# Patient Record
Sex: Female | Born: 1975 | Race: Black or African American | Hispanic: No | Smoking: Former smoker
Health system: Southern US, Community
[De-identification: ages and names within clinical notes are randomized; demographics above are authoritative.]

## PROBLEM LIST (undated history)

## (undated) DIAGNOSIS — R569 Unspecified convulsions: Secondary | ICD-10-CM

## (undated) DIAGNOSIS — Z0282 Encounter for adoption services: Secondary | ICD-10-CM

## (undated) DIAGNOSIS — G40909 Epilepsy, unspecified, not intractable, without status epilepticus: Secondary | ICD-10-CM

## (undated) DIAGNOSIS — H5440 Blindness, one eye, unspecified eye: Secondary | ICD-10-CM

## (undated) DIAGNOSIS — E119 Type 2 diabetes mellitus without complications: Secondary | ICD-10-CM

## (undated) DIAGNOSIS — G43909 Migraine, unspecified, not intractable, without status migrainosus: Secondary | ICD-10-CM

## (undated) DIAGNOSIS — M1990 Unspecified osteoarthritis, unspecified site: Secondary | ICD-10-CM

## (undated) DIAGNOSIS — I10 Essential (primary) hypertension: Secondary | ICD-10-CM

## (undated) DIAGNOSIS — Q796 Ehlers-Danlos syndrome, unspecified: Secondary | ICD-10-CM

## (undated) DIAGNOSIS — J45909 Unspecified asthma, uncomplicated: Secondary | ICD-10-CM

## (undated) DIAGNOSIS — Z789 Other specified health status: Secondary | ICD-10-CM

## (undated) DIAGNOSIS — T8859XA Other complications of anesthesia, initial encounter: Secondary | ICD-10-CM

## (undated) HISTORY — DX: Unspecified convulsions: R56.9

## (undated) HISTORY — DX: Essential (primary) hypertension: I10

## (undated) HISTORY — PX: CERVICAL FUSION: SHX112

## (undated) HISTORY — DX: Type 2 diabetes mellitus without complications: E11.9

## (undated) HISTORY — PX: HIP ARTHROSCOPY: SUR88

## (undated) HISTORY — PX: CARPAL TUNNEL RELEASE: SHX101

## (undated) HISTORY — PX: PARTIAL HYSTERECTOMY: SHX80

---

## 2012-03-15 DIAGNOSIS — E1140 Type 2 diabetes mellitus with diabetic neuropathy, unspecified: Secondary | ICD-10-CM | POA: Insufficient documentation

## 2012-03-15 DIAGNOSIS — G894 Chronic pain syndrome: Secondary | ICD-10-CM | POA: Insufficient documentation

## 2012-03-15 DIAGNOSIS — Z5181 Encounter for therapeutic drug level monitoring: Secondary | ICD-10-CM | POA: Insufficient documentation

## 2012-05-09 DIAGNOSIS — M461 Sacroiliitis, not elsewhere classified: Secondary | ICD-10-CM | POA: Insufficient documentation

## 2012-07-05 DIAGNOSIS — M5137 Other intervertebral disc degeneration, lumbosacral region: Secondary | ICD-10-CM | POA: Insufficient documentation

## 2012-10-27 DIAGNOSIS — R739 Hyperglycemia, unspecified: Secondary | ICD-10-CM | POA: Insufficient documentation

## 2013-07-31 DIAGNOSIS — R270 Ataxia, unspecified: Secondary | ICD-10-CM | POA: Insufficient documentation

## 2013-07-31 DIAGNOSIS — G255 Other chorea: Secondary | ICD-10-CM | POA: Insufficient documentation

## 2013-09-21 DIAGNOSIS — L739 Follicular disorder, unspecified: Secondary | ICD-10-CM | POA: Insufficient documentation

## 2013-10-20 DIAGNOSIS — M5020 Other cervical disc displacement, unspecified cervical region: Secondary | ICD-10-CM | POA: Insufficient documentation

## 2013-10-24 DIAGNOSIS — G43719 Chronic migraine without aura, intractable, without status migrainosus: Secondary | ICD-10-CM | POA: Insufficient documentation

## 2014-02-10 DIAGNOSIS — M542 Cervicalgia: Secondary | ICD-10-CM | POA: Insufficient documentation

## 2014-06-29 DIAGNOSIS — M79641 Pain in right hand: Secondary | ICD-10-CM | POA: Insufficient documentation

## 2014-07-11 DIAGNOSIS — Z794 Long term (current) use of insulin: Secondary | ICD-10-CM | POA: Insufficient documentation

## 2014-07-22 DIAGNOSIS — G5601 Carpal tunnel syndrome, right upper limb: Secondary | ICD-10-CM | POA: Insufficient documentation

## 2014-08-24 DIAGNOSIS — M25561 Pain in right knee: Secondary | ICD-10-CM | POA: Insufficient documentation

## 2014-08-27 DIAGNOSIS — F331 Major depressive disorder, recurrent, moderate: Secondary | ICD-10-CM | POA: Insufficient documentation

## 2014-09-25 ENCOUNTER — Ambulatory Visit: Payer: Self-pay | Admitting: Sports Medicine

## 2014-12-12 DIAGNOSIS — R252 Cramp and spasm: Secondary | ICD-10-CM | POA: Insufficient documentation

## 2015-04-03 ENCOUNTER — Ambulatory Visit (INDEPENDENT_AMBULATORY_CARE_PROVIDER_SITE_OTHER): Payer: Federal, State, Local not specified - PPO | Admitting: Sports Medicine

## 2015-04-03 ENCOUNTER — Encounter: Payer: Self-pay | Admitting: Sports Medicine

## 2015-04-03 VITALS — Ht 62.0 in | Wt 203.0 lb

## 2015-04-03 DIAGNOSIS — Q796 Ehlers-Danlos syndrome: Secondary | ICD-10-CM | POA: Diagnosis not present

## 2015-04-03 DIAGNOSIS — M62831 Muscle spasm of calf: Secondary | ICD-10-CM | POA: Diagnosis not present

## 2015-04-03 DIAGNOSIS — Q7962 Hypermobile Ehlers-Danlos syndrome: Secondary | ICD-10-CM

## 2015-04-03 DIAGNOSIS — G40909 Epilepsy, unspecified, not intractable, without status epilepticus: Secondary | ICD-10-CM

## 2015-04-03 DIAGNOSIS — M25572 Pain in left ankle and joints of left foot: Secondary | ICD-10-CM

## 2015-04-03 DIAGNOSIS — G90523 Complex regional pain syndrome I of lower limb, bilateral: Secondary | ICD-10-CM | POA: Diagnosis not present

## 2015-04-03 DIAGNOSIS — M25571 Pain in right ankle and joints of right foot: Secondary | ICD-10-CM

## 2015-04-03 DIAGNOSIS — E109 Type 1 diabetes mellitus without complications: Secondary | ICD-10-CM

## 2015-04-03 DIAGNOSIS — G43909 Migraine, unspecified, not intractable, without status migrainosus: Secondary | ICD-10-CM

## 2015-04-03 MED ORDER — DOXEPIN HCL 10 MG PO CAPS: 10.0000 mg | ORAL_CAPSULE | Freq: Two times a day (BID) | ORAL | Status: DC

## 2015-04-03 NOTE — Progress Notes (Signed)
  Sara [name] - 39 y.o. female MRN [id number]  Date of birth: October 30, 1975  CC: b/l lower extremity swelling and pain.   SUBJECTIVE:   HPI  - Diagnosed with EDS type III several months ago Dr. Peggye Form at [facility].  Recommended to see [place] by her therapist Silas Flood, who is a patient here).  - Beighton score: 3/9 (5th digit: 0; thumbs: 0, Spine: 0; elbows: 1; knees: 2) - Multiple joint and muscle pain. Came here to get help with mobility and pain.   - Seen by orthopedics at [facility].   - Has Epilepsy and is on several seizure meds.  - Type 1 diabetic with pump...reportedly well controlled.   Neck: had fusion done several years ago and is now is starting to have pain again.  - Neck posture poor  - No longer has cervical collar - She tires at the end of the day and has trouble maintaining good posture  - B/l hip, lower leg, and ankle pain.  - Pain and swelling for years that has slowly worsened - Had right hip arthroscopy in 2012, which didn't help much. Told she may need total hip.  - Sent to pain management since 2012 for hip, SI, back pain. She has had multiple injections in her neck and back with minimal relief.     - Takes oxycodone/hydrocodone   - Takes multiple multiple musclerelaxers - Takes doxepin  - Calf spasms frequently - Swelling in ankles daily.  Painful popping.  - No surgeries to ankles - Usually wears sneakers. - Now she feels very tight in her ankles and pain with even straight line walking.    ROS:     No acute changes in her review of systems other than that listed in her HPI.    HISTORY: Past Medical, Surgical, Social, and Family History Reviewed & Updated per EMR.  Pertinent Historical Findings include: - Type 1 diabetic with pump - Epilepsy - EDS type III - Multiple ortho surgeries in the past including a cervical fusion, hip arthroscopy, carpal tunnel release; all of which do not seem to provide lasting benefit.  - Former  smoker  OBJECTIVE: Ht  (1.575 m)  Wt 203 lb (92.08 kg)  BMI 37.12 kg/m2  Physical Exam  Gen: calm, tearful intermittently thorughout exam. Non-labored breathing  Neck: Negative spurling's Full neck range of motion Grip strength and sensation normal in bilateral hands Strength good C4 to T1 distribution No sensory change to C4 to T1 Reflexes normal  Lower legs: Tight gastroc with minimal pain to deep palpation b/l. No skin changes.   Ankle: b/l 1+ pitting edema, symmetric. No  erythema or other skin changes. Range of motion is limited in all directions.  Strength is 4+/5 in all directions. Moderate pain with resisted movement.  Stable lateral and medial ligaments: although quite painful b/l  Both soft tissue and bony tenderness b/l over med/lat malleoli as well as anteriorly Antalgic gait.   Neurovascularly intact.    MEDICATIONS, LABS & OTHER ORDERS: Previous Medications   No medications on file   Modified Medications   No medications on file   New Prescriptions   No medications on file   Discontinued Medications   No medications on file  No orders of the defined types were placed in this encounter.   ASSESSMENT & PLAN: See problem based charting & AVS for pt instructions.

## 2015-04-03 NOTE — Patient Instructions (Signed)
Neck Pain: - Wear your neck collar several hours a day as needed when your neck is getting tired.  - Work on isometric exercises: Push against your hand forward, backward, and both right and left.   - Try to keep your chin in line with your breastbone when sitting. - Try to keep your shoulders back behind your ears when sitting.   - Do 3 repetitions for20-30 seconds of "T" & "H" stretching at the end of the day.   Ankles: - Provide b/l ankle sleeves. Wear those as much as possible.  - Once or twice a day try to range of motion exercises (one option is to spell the alphabet) in warm water.    - Will increase doxepine to  twice daily.  After 3 weeks you may increase to 3 times a day.   Please make a follow up appointment in 6 weeks.

## 2015-04-04 ENCOUNTER — Encounter: Payer: Self-pay | Admitting: Sports Medicine

## 2015-04-04 DIAGNOSIS — M62831 Muscle spasm of calf: Secondary | ICD-10-CM | POA: Insufficient documentation

## 2015-04-04 DIAGNOSIS — Q7962 Hypermobile Ehlers-Danlos syndrome: Secondary | ICD-10-CM | POA: Insufficient documentation

## 2015-04-04 DIAGNOSIS — M79669 Pain in unspecified lower leg: Secondary | ICD-10-CM | POA: Insufficient documentation

## 2015-04-04 DIAGNOSIS — G40909 Epilepsy, unspecified, not intractable, without status epilepticus: Secondary | ICD-10-CM | POA: Insufficient documentation

## 2015-04-04 DIAGNOSIS — E109 Type 1 diabetes mellitus without complications: Secondary | ICD-10-CM | POA: Insufficient documentation

## 2015-04-04 DIAGNOSIS — G43909 Migraine, unspecified, not intractable, without status migrainosus: Secondary | ICD-10-CM | POA: Insufficient documentation

## 2015-04-04 NOTE — Assessment & Plan Note (Signed)
Patient was recently diagnosed with EDS Type III. She has had a neck fusion as well as several other surgical interventions with minimal long term improvement in pain.  Her biggest joint issues today are her neck and b/l ankles.  -- Neck Pain: We provided her with a cervical collar to use for a few hours at the end of the day if she finds her neck stablizing muscles tiring. She was instructed on isometricstrengthening exercises.  She was also given stretches "T" & "H".  Lastly, we have encouraged her to maintain as good of posture as she is able.  -- Ankles/lower legs: We are concerned she may be developing RSD. Her mobility is now gone and she has limited motion and pain with even straight line ambulation. We provided her with b/l ankle sleeves to help with swelling/stability. We asked her to try and do RoM exercises in hot water.   We also increased her doxepine to  twice daily and then after 3 weeks  to 3 times a day.   Plan to f/u in 6 weeks

## 2015-04-04 NOTE — Assessment & Plan Note (Addendum)
Possibly developing early RSD symptoms of b/l lower legs/ankles. Will try to prevent it from becoming a chronic issue with exercises listed in AVS and those in listed under EDS Type III.   Chronic pain, diffuse swelling and decreasing ROM all are suggestive

## 2015-05-13 ENCOUNTER — Other Ambulatory Visit: Payer: Self-pay | Admitting: *Deleted

## 2015-05-13 MED ORDER — DOXEPIN HCL 10 MG PO CAPS: 10.0000 mg | ORAL_CAPSULE | Freq: Two times a day (BID) | ORAL | Status: DC

## 2015-05-22 ENCOUNTER — Ambulatory Visit (INDEPENDENT_AMBULATORY_CARE_PROVIDER_SITE_OTHER): Payer: Federal, State, Local not specified - PPO | Admitting: Sports Medicine

## 2015-05-22 ENCOUNTER — Encounter: Payer: Self-pay | Admitting: Sports Medicine

## 2015-05-22 VITALS — BP 117/77 | Ht 62.0 in | Wt 204.0 lb

## 2015-05-22 DIAGNOSIS — M79669 Pain in unspecified lower leg: Secondary | ICD-10-CM | POA: Diagnosis not present

## 2015-05-22 DIAGNOSIS — Q7962 Hypermobile Ehlers-Danlos syndrome: Secondary | ICD-10-CM

## 2015-05-22 DIAGNOSIS — M545 Low back pain, unspecified: Secondary | ICD-10-CM

## 2015-05-22 DIAGNOSIS — Q796 Ehlers-Danlos syndrome: Secondary | ICD-10-CM | POA: Diagnosis not present

## 2015-05-22 DIAGNOSIS — M5442 Lumbago with sciatica, left side: Secondary | ICD-10-CM | POA: Diagnosis not present

## 2015-05-22 DIAGNOSIS — M5441 Lumbago with sciatica, right side: Secondary | ICD-10-CM | POA: Diagnosis not present

## 2015-05-22 NOTE — Assessment & Plan Note (Signed)
Was able to give her some suggestions for her neck and low back I do not have a lot of other suggestions that I think would help her  She can return to her primary care physician and her current specialty care  I am happy to see again in the future if needed

## 2015-05-22 NOTE — Progress Notes (Signed)
   Subjective:    Patient ID: Sara [name], female    DOB: June 15, 1976, 39 y.o.   MRN: [id number]  HPI 39 y/o with PMHx of cervical plating, EDS type III who presents in follow up of neck and leg pain.  Although, she mostly complains of back and hip pain.  She states that the cervical collar does help with the pain when she uses it.  She has had an EMG done which she states "wasn't good in my right arm".  She will drop things in her right hand and she is right handed.  We do not have her EMG report here.  She has several specialists that she sees for her chronic medical problems, as follows: Dr. Myna Hidalgo for pain management, Dr. Estella Husk for Neurology, Dr. Venetia Maxon for Neurosurgery, Dr. Feliberto Gottron for Genetics, Dr. Randie Heinz for Endocrinologist, Dr. Claria Dice for PCP.  She has type 1 DM with an insulin pump and most recent A1c was 8%.    Her low back pain is constant and becomes worse with extending back.  She does get muscle spasms frequently which will wake her up in the night.  She gets relief with leaning forward.  She does complain of pain radiating down her bilateral legs at times.  She complains of numbness in her bilateral feet to her ankles.  She denies bowel or bladder incontinence.  We gave her doxepin on her last visit in case some of the leg symptoms or autonomic dysfunction which is fairly common in [place]. She saw no benefit with this.  Her neck and shoulder pain has been benefited when she use the soft collar we gave her    Review of Systems See HPI No recent fevers, no UTI symptoms, no bowel or bladder dysfunction, no skin rashes    Objective:   Physical Exam  Constitutional: She is oriented to person, place, and time. She appears well-developed and well-nourished.  Neurological: She is alert and oriented to person, place, and time.  Skin: Skin is warm and dry.  Gen: no acute distress, but did have muscle spasm in room which made pt tearful MSK: Cervical spine- normal  alignment, b/l TTP on cervical paraspinal muscles with hypertonicity  -ROM limited 2/2 pain  -Spurling's neg  -ROM shoulder limited b/l 2/2 pain On passive range of motion -- nearly full shoulder abduction and elevation  -muscle strength 4/5 with abduction, flexion, extension 2/2 pain in shoulder, finger grip 4/5 bilaterally  Pt leaning forward for comfort in low back  With back support she is able to walk more comfortably but still has difficulty standing on her toes      Assessment & Plan:  Please see after visit summary for assessment and plan.  Pt with low back pain that is relieved by leaning forward, suggesting spinal stenosis in lumbar spine.  Do not have MRI records, but did recommend follow up with neurosurgeon and pain management for options regarding these symptoms.

## 2015-05-22 NOTE — Assessment & Plan Note (Signed)
The pattern is not clearly radicular I am concerned this could be related to her long-standing diabetes and some part of a peripheral neuropathy  However with her low back pain and probable spinal stenosis some of this could be referred

## 2015-05-22 NOTE — Assessment & Plan Note (Signed)
Today I fitted her for a lumbar support This seemed to be helpful in reducing pain when she stood or wall  I think she needs to go ahead for further evaluation from her neurosurgeon She has had cervical spine fusion The status of her low back is unclear to me Today it was too painful for evaluation but I suspect there was some weakness based on her inability to stand and walk on her toes  She needs to discuss with her pain management doctor and neurosurgeon if she can combine or reduce some of her medications We stopped doxepin today She appeared to be duplicating one of her medications and we told her to use only one of these ( lexapro)

## 2015-05-22 NOTE — Patient Instructions (Addendum)
Low back pain- Will do a trial of back support for comfort.     -Please stop doxepin as it has not helped.  Take lexapro and not celexa.  Will need to find out if taking lyrica since recently taken off neurontin.  I would think either lyrica or neurontin would be helpful.  Please stop flexeril.    -continue exercises for neck and cervical collar as needed for neck pain.  -Continue follow up with pain management and neurosurgeon for nerve pain from back pain.  Ask about modifying medicines and possibly removing some.    - continue with endocrine doctor for diabetes control to lower A1c.  -Keep swinging shoulders to prevent a frozen shoulder.

## 2015-06-20 DIAGNOSIS — L282 Other prurigo: Secondary | ICD-10-CM | POA: Insufficient documentation

## 2015-06-23 DIAGNOSIS — A048 Other specified bacterial intestinal infections: Secondary | ICD-10-CM | POA: Insufficient documentation

## 2015-11-09 DIAGNOSIS — M94261 Chondromalacia, right knee: Secondary | ICD-10-CM | POA: Insufficient documentation

## 2016-01-09 DIAGNOSIS — H2640 Unspecified secondary cataract: Secondary | ICD-10-CM | POA: Insufficient documentation

## 2016-01-09 DIAGNOSIS — H401131 Primary open-angle glaucoma, bilateral, mild stage: Secondary | ICD-10-CM | POA: Insufficient documentation

## 2016-01-28 DIAGNOSIS — M79641 Pain in right hand: Secondary | ICD-10-CM | POA: Insufficient documentation

## 2016-04-29 DIAGNOSIS — M1711 Unilateral primary osteoarthritis, right knee: Secondary | ICD-10-CM | POA: Insufficient documentation

## 2016-04-29 DIAGNOSIS — M2241 Chondromalacia patellae, right knee: Secondary | ICD-10-CM | POA: Insufficient documentation

## 2016-05-27 DIAGNOSIS — L0291 Cutaneous abscess, unspecified: Secondary | ICD-10-CM | POA: Insufficient documentation

## 2017-02-27 DIAGNOSIS — E0590 Thyrotoxicosis, unspecified without thyrotoxic crisis or storm: Secondary | ICD-10-CM | POA: Insufficient documentation

## 2017-09-20 DIAGNOSIS — E785 Hyperlipidemia, unspecified: Secondary | ICD-10-CM | POA: Insufficient documentation

## 2017-09-20 DIAGNOSIS — Z720 Tobacco use: Secondary | ICD-10-CM | POA: Insufficient documentation

## 2017-09-20 DIAGNOSIS — J101 Influenza due to other identified influenza virus with other respiratory manifestations: Secondary | ICD-10-CM | POA: Insufficient documentation

## 2017-09-20 DIAGNOSIS — J45902 Unspecified asthma with status asthmaticus: Secondary | ICD-10-CM | POA: Insufficient documentation

## 2017-09-20 DIAGNOSIS — I10 Essential (primary) hypertension: Secondary | ICD-10-CM | POA: Insufficient documentation

## 2018-03-11 ENCOUNTER — Observation Stay
Admission: EM | Admit: 2018-03-11 | Discharge: 2018-03-13 | Disposition: A | Discharge status: Home / Self Care | Payer: Federal, State, Local not specified - PPO | Attending: Internal Medicine | Admitting: Internal Medicine

## 2018-03-11 DIAGNOSIS — Z794 Long term (current) use of insulin: Secondary | ICD-10-CM | POA: Diagnosis not present

## 2018-03-11 DIAGNOSIS — G43909 Migraine, unspecified, not intractable, without status migrainosus: Secondary | ICD-10-CM | POA: Diagnosis not present

## 2018-03-11 DIAGNOSIS — Z9114 Patient's other noncompliance with medication regimen: Secondary | ICD-10-CM | POA: Insufficient documentation

## 2018-03-11 DIAGNOSIS — Q796 Ehlers-Danlos syndrome: Secondary | ICD-10-CM | POA: Insufficient documentation

## 2018-03-11 DIAGNOSIS — F10120 Alcohol abuse with intoxication, uncomplicated: Secondary | ICD-10-CM | POA: Diagnosis not present

## 2018-03-11 DIAGNOSIS — R000 Tachycardia, unspecified: Secondary | ICD-10-CM | POA: Insufficient documentation

## 2018-03-11 DIAGNOSIS — Z888 Allergy status to other drugs, medicaments and biological substances status: Secondary | ICD-10-CM | POA: Insufficient documentation

## 2018-03-11 DIAGNOSIS — G40409 Other generalized epilepsy and epileptic syndromes, not intractable, without status epilepticus: Secondary | ICD-10-CM | POA: Diagnosis not present

## 2018-03-11 DIAGNOSIS — Z79899 Other long term (current) drug therapy: Secondary | ICD-10-CM | POA: Diagnosis not present

## 2018-03-11 DIAGNOSIS — E119 Type 2 diabetes mellitus without complications: Secondary | ICD-10-CM | POA: Insufficient documentation

## 2018-03-11 DIAGNOSIS — I10 Essential (primary) hypertension: Secondary | ICD-10-CM | POA: Diagnosis not present

## 2018-03-11 DIAGNOSIS — Z981 Arthrodesis status: Secondary | ICD-10-CM | POA: Diagnosis not present

## 2018-03-11 DIAGNOSIS — Z91040 Latex allergy status: Secondary | ICD-10-CM | POA: Diagnosis not present

## 2018-03-11 DIAGNOSIS — F10920 Alcohol use, unspecified with intoxication, uncomplicated: Secondary | ICD-10-CM

## 2018-03-11 DIAGNOSIS — R569 Unspecified convulsions: Secondary | ICD-10-CM

## 2018-03-12 ENCOUNTER — Emergency Department: Payer: Federal, State, Local not specified - PPO

## 2018-03-12 ENCOUNTER — Other Ambulatory Visit: Payer: Self-pay

## 2018-03-12 DIAGNOSIS — R569 Unspecified convulsions: Secondary | ICD-10-CM

## 2018-03-12 DIAGNOSIS — G40409 Other generalized epilepsy and epileptic syndromes, not intractable, without status epilepticus: Secondary | ICD-10-CM | POA: Diagnosis not present

## 2018-03-12 LAB — GLUCOSE, CAPILLARY
Glucose-Capillary: 309 mg/dL — ABNORMAL HIGH (ref 70–99)
Glucose-Capillary: 220 mg/dL — ABNORMAL HIGH (ref 70–99)
Glucose-Capillary: 275 mg/dL — ABNORMAL HIGH (ref 70–99)
Glucose-Capillary: 341 mg/dL — ABNORMAL HIGH (ref 70–99)

## 2018-03-12 LAB — URINALYSIS, COMPLETE (UACMP) WITH MICROSCOPIC
Bacteria, UA: NONE SEEN
Bilirubin Urine: NEGATIVE
Glucose, UA: >=500 mg/dL — AB
Hgb urine dipstick: NEGATIVE
Ketones, ur: 5 mg/dL — AB
Leukocytes, UA: NEGATIVE
Nitrite: NEGATIVE
Protein, ur: NEGATIVE mg/dL
Specific Gravity, Urine: 1.022 (ref 1.005–1.030)
pH: 6 (ref 5.0–8.0)

## 2018-03-12 LAB — URINE DRUG SCREEN, QUALITATIVE (ARMC ONLY)
Amphetamines, Ur Screen: NOT DETECTED
Barbiturates, Ur Screen: NOT DETECTED
Benzodiazepine, Ur Scrn: NOT DETECTED
Cannabinoid 50 Ng, Ur ~~LOC~~: NOT DETECTED
Cocaine Metabolite,Ur ~~LOC~~: NOT DETECTED
MDMA (Ecstasy)Ur Screen: NOT DETECTED
Methadone Scn, Ur: NOT DETECTED
Opiate, Ur Screen: NOT DETECTED
Phencyclidine (PCP) Ur S: NOT DETECTED
Tricyclic, Ur Screen: NOT DETECTED

## 2018-03-12 LAB — COMPREHENSIVE METABOLIC PANEL
ALT: 21 U/L (ref 0–44)
AST: 24 U/L (ref 15–41)
Albumin: 3.7 g/dL (ref 3.5–5.0)
Alkaline Phosphatase: 78 U/L (ref 38–126)
Anion gap: 15 (ref 5–15)
BUN: 11 mg/dL (ref 6–20)
CO2: 22 mmol/L (ref 22–32)
Calcium: 8.6 mg/dL — ABNORMAL LOW (ref 8.9–10.3)
Chloride: 103 mmol/L (ref 98–111)
Creatinine, Ser: 0.74 mg/dL (ref 0.44–1.00)
GFR calc Af Amer: >60 mL/min (ref >60)
GFR calc non Af Amer: >60 mL/min (ref >60)
Glucose, Bld: 411 mg/dL — ABNORMAL HIGH (ref 70–99)
Potassium: 4.2 mmol/L (ref 3.5–5.1)
Sodium: 140 mmol/L (ref 135–145)
Total Bilirubin: 0.5 mg/dL (ref 0.3–1.2)
Total Protein: 7.1 g/dL (ref 6.5–8.1)

## 2018-03-12 LAB — CBC WITH DIFFERENTIAL/PLATELET
Basophils Absolute: 0 10*3/uL (ref 0–0.1)
Basophils Relative: 1 %
Eosinophils Absolute: 0.1 10*3/uL (ref 0–0.7)
Eosinophils Relative: 1 %
HCT: 40.4 % (ref 35.0–47.0)
Hemoglobin: 12.5 g/dL (ref 12.0–16.0)
Lymphocytes Relative: 27 %
Lymphs Abs: 1.8 10*3/uL (ref 1.0–3.6)
MCH: 25.3 pg — ABNORMAL LOW (ref 26.0–34.0)
MCHC: 31 g/dL — ABNORMAL LOW (ref 32.0–36.0)
MCV: 81.5 fL (ref 80.0–100.0)
Monocytes Absolute: 0.3 10*3/uL (ref 0.2–0.9)
Monocytes Relative: 5 %
Neutro Abs: 4.4 10*3/uL (ref 1.4–6.5)
Neutrophils Relative %: 66 %
Platelets: 188 10*3/uL (ref 150–440)
RBC: 4.95 MIL/uL (ref 3.80–5.20)
RDW: 14.5 % (ref 11.5–14.5)
WBC: 6.7 10*3/uL (ref 3.6–11.0)

## 2018-03-12 LAB — TSH: TSH: 0.529 u[IU]/mL (ref 0.350–4.500)

## 2018-03-12 LAB — SALICYLATE LEVEL: Salicylate Lvl: <7 mg/dL (ref 2.8–30.0)

## 2018-03-12 LAB — HEMOGLOBIN A1C
Hgb A1c MFr Bld: 10.9 % — ABNORMAL HIGH (ref 4.8–5.6)
Mean Plasma Glucose: 266.13 mg/dL

## 2018-03-12 LAB — TROPONIN I: Troponin I: <0.03 ng/mL (ref <0.03)

## 2018-03-12 LAB — HCG, QUANTITATIVE, PREGNANCY: hCG, Beta Chain, Quant, S: <1 m[IU]/mL (ref <5)

## 2018-03-12 LAB — ACETAMINOPHEN LEVEL: Acetaminophen (Tylenol), Serum: <10 ug/mL — ABNORMAL LOW (ref 10–30)

## 2018-03-12 LAB — PREGNANCY, URINE: Preg Test, Ur: NEGATIVE

## 2018-03-12 LAB — ETHANOL: Alcohol, Ethyl (B): 116 mg/dL — ABNORMAL HIGH (ref <10)

## 2018-03-12 MED ORDER — SODIUM CHLORIDE 0.9 % IV SOLN
150.0000 mg | Freq: Once | INTRAVENOUS | Status: AC
  Administered 2018-03-12: 150 mg via INTRAVENOUS
  Filled 2018-03-12: qty 15

## 2018-03-12 MED ORDER — LORAZEPAM 2 MG/ML IJ SOLN
1.0000 mg | Freq: Once | INTRAMUSCULAR | Status: AC
  Administered 2018-03-12: 1 mg via INTRAVENOUS
  Filled 2018-03-12: qty 1

## 2018-03-12 MED ORDER — SODIUM CHLORIDE 0.9 % IV SOLN
200.0000 mg | Freq: Two times a day (BID) | INTRAVENOUS | Status: DC
  Administered 2018-03-12 – 2018-03-13 (×3): 200 mg via INTRAVENOUS
  Filled 2018-03-12 (×5): qty 20

## 2018-03-12 MED ORDER — INSULIN ASPART 100 UNIT/ML ~~LOC~~ SOLN
0.0000 [IU] | Freq: Every day | SUBCUTANEOUS | Status: DC
  Administered 2018-03-12: 4 [IU] via SUBCUTANEOUS
  Filled 2018-03-12: qty 1

## 2018-03-12 MED ORDER — ONDANSETRON HCL 4 MG/2ML IJ SOLN: 4.0000 mg | Freq: Four times a day (QID) | INTRAMUSCULAR | Status: DC | PRN

## 2018-03-12 MED ORDER — LORAZEPAM 2 MG/ML IJ SOLN
2.0000 mg | Freq: Once | INTRAMUSCULAR | Status: AC
  Administered 2018-03-12: 2 mg via INTRAVENOUS

## 2018-03-12 MED ORDER — TOPIRAMATE 100 MG PO TABS
100.0000 mg | ORAL_TABLET | Freq: Two times a day (BID) | ORAL | Status: DC
  Administered 2018-03-12 – 2018-03-13 (×3): 100 mg via ORAL
  Filled 2018-03-12 (×3): qty 1

## 2018-03-12 MED ORDER — ONDANSETRON HCL 4 MG PO TABS: 4.0000 mg | ORAL_TABLET | Freq: Four times a day (QID) | ORAL | Status: DC | PRN

## 2018-03-12 MED ORDER — DOCUSATE SODIUM 100 MG PO CAPS
100.0000 mg | ORAL_CAPSULE | Freq: Two times a day (BID) | ORAL | Status: DC
  Administered 2018-03-12 – 2018-03-13 (×3): 100 mg via ORAL
  Filled 2018-03-12 (×3): qty 1

## 2018-03-12 MED ORDER — LAMOTRIGINE 100 MG PO TABS
100.0000 mg | ORAL_TABLET | Freq: Once | ORAL | Status: AC
  Administered 2018-03-12: 100 mg via ORAL
  Filled 2018-03-12: qty 1

## 2018-03-12 MED ORDER — LORAZEPAM 2 MG/ML IJ SOLN
INTRAMUSCULAR | Status: AC
  Filled 2018-03-12: qty 1

## 2018-03-12 MED ORDER — BUTALBITAL-APAP-CAFFEINE 50-325-40 MG PO TABS
1.0000 | ORAL_TABLET | Freq: Four times a day (QID) | ORAL | Status: DC | PRN
  Administered 2018-03-12: 09:00:00 1 via ORAL
  Filled 2018-03-12 (×2): qty 1

## 2018-03-12 MED ORDER — ACETAMINOPHEN 650 MG RE SUPP: 650.0000 mg | Freq: Four times a day (QID) | RECTAL | Status: DC | PRN

## 2018-03-12 MED ORDER — OXYCODONE-ACETAMINOPHEN 5-325 MG PO TABS
2.0000 | ORAL_TABLET | Freq: Four times a day (QID) | ORAL | Status: DC | PRN
  Administered 2018-03-12: 2 via ORAL
  Filled 2018-03-12: qty 2

## 2018-03-12 MED ORDER — LORAZEPAM 2 MG/ML IJ SOLN
2.0000 mg | INTRAMUSCULAR | Status: DC | PRN
  Administered 2018-03-12: 2 mg via INTRAVENOUS

## 2018-03-12 MED ORDER — INSULIN ASPART 100 UNIT/ML ~~LOC~~ SOLN
0.0000 [IU] | Freq: Three times a day (TID) | SUBCUTANEOUS | Status: DC
  Administered 2018-03-12: 3 [IU] via SUBCUTANEOUS
  Administered 2018-03-12: 5 [IU] via SUBCUTANEOUS
  Administered 2018-03-12 – 2018-03-13 (×2): 7 [IU] via SUBCUTANEOUS
  Filled 2018-03-12 (×4): qty 1

## 2018-03-12 MED ORDER — ACETAMINOPHEN 325 MG PO TABS: 650.0000 mg | ORAL_TABLET | Freq: Four times a day (QID) | ORAL | Status: DC | PRN

## 2018-03-12 MED ORDER — SODIUM CHLORIDE 0.9 % IV BOLUS
1000.0000 mL | Freq: Once | INTRAVENOUS | Status: AC
  Administered 2018-03-12: 1000 mL via INTRAVENOUS

## 2018-03-12 MED ORDER — ENOXAPARIN SODIUM 40 MG/0.4ML ~~LOC~~ SOLN
40.0000 mg | SUBCUTANEOUS | Status: DC
  Administered 2018-03-12: 20:00:00 40 mg via SUBCUTANEOUS
  Filled 2018-03-12: qty 0.4

## 2018-03-12 MED ORDER — LACOSAMIDE 50 MG PO TABS
200.0000 mg | ORAL_TABLET | Freq: Two times a day (BID) | ORAL | Status: DC
  Filled 2018-03-12: qty 4

## 2018-03-12 MED ORDER — TOPIRAMATE 25 MG PO TABS
100.0000 mg | ORAL_TABLET | Freq: Once | ORAL | Status: AC
  Administered 2018-03-12: 100 mg via ORAL
  Filled 2018-03-12: qty 4

## 2018-03-12 MED ORDER — LAMOTRIGINE 100 MG PO TABS
100.0000 mg | ORAL_TABLET | Freq: Two times a day (BID) | ORAL | Status: DC
  Administered 2018-03-12 – 2018-03-13 (×3): 100 mg via ORAL
  Filled 2018-03-12 (×3): qty 1

## 2018-03-12 MED ORDER — INSULIN ASPART 100 UNIT/ML ~~LOC~~ SOLN
8.0000 [IU] | Freq: Once | SUBCUTANEOUS | Status: AC
  Administered 2018-03-12: 8 [IU] via SUBCUTANEOUS
  Filled 2018-03-12: qty 1

## 2018-03-12 NOTE — Progress Notes (Addendum)
[facility] - [place] at [facility]   PATIENT NAME: Sara [name]    MR#:  [id number]  DATE OF BIRTH:  01/27/1976  SUBJECTIVE:  CHIEF COMPLAINT:   Chief Complaint  Patient presents with  . Seizures  Patient seen and evaluated today Had seizure today No headache  REVIEW OF SYSTEMS:    ROS  CONSTITUTIONAL: No documented fever. No fatigue, weakness. No weight gain, no weight loss.  EYES: No blurry or double vision.  ENT: No tinnitus. No postnasal drip. No redness of the oropharynx.  RESPIRATORY: No cough, no wheeze, no hemoptysis. No dyspnea.  CARDIOVASCULAR: No chest pain. No orthopnea. No palpitations. No syncope.  GASTROINTESTINAL: No nausea, no vomiting or diarrhea. No abdominal pain. No melena or hematochezia.  GENITOURINARY: No dysuria or hematuria.  ENDOCRINE: No polyuria or nocturia. No heat or cold intolerance.  HEMATOLOGY: No anemia. No bruising. No bleeding.  INTEGUMENTARY: No rashes. No lesions.  MUSCULOSKELETAL: No arthritis. No swelling. No gout.  NEUROLOGIC: No numbness, tingling, or ataxia. No seizure-type activity.  PSYCHIATRIC: No anxiety. No insomnia. No ADD.   DRUG ALLERGIES:   Allergies  Allergen Reactions  . Latex   . Tape     VITALS:  Blood pressure 120/72, pulse (!) 120, temperature 98.6 F (37 C), temperature source Oral, resp. rate (!) 21, height 5\' 6"  (1.676 m), weight 85.3 kg (188 lb), SpO2 97 %.  PHYSICAL EXAMINATION:   Physical Exam  GENERAL:  42 y.o.-year-old patient lying in the bed with no acute distress.  EYES: Pupils equal, round, reactive to light and accommodation. No scleral icterus. Extraocular muscles intact.  HEENT: Head atraumatic, normocephalic. Oropharynx and nasopharynx clear.  NECK:  Supple, no jugular venous distention. No thyroid enlargement, no tenderness.  LUNGS: Normal breath sounds bilaterally, no wheezing, rales, rhonchi. No use of accessory muscles of respiration.  CARDIOVASCULAR: S1,  S2 normal. No murmurs, rubs, or gallops.  ABDOMEN: Soft, nontender, nondistended. Bowel sounds present. No organomegaly or mass.  EXTREMITIES: No cyanosis, clubbing or edema b/l.    NEUROLOGIC: Cranial nerves II through XII are intact. No focal Motor or sensory deficits b/l.   PSYCHIATRIC: The patient is alert and oriented x 3.  SKIN: No obvious rash, lesion, or ulcer.   LABORATORY PANEL:   CBC Recent Labs  Lab 03/12/18 0007  WBC 6.7  HGB 12.5  HCT 40.4  PLT 188   ------------------------------------------------------------------------------------------------------------------ Chemistries  Recent Labs  Lab 03/12/18 0007  NA 140  K 4.2  CL 103  CO2 22  GLUCOSE 411*  BUN 11  CREATININE 0.74  CALCIUM 8.6*  AST 24  ALT 21  ALKPHOS 78  BILITOT 0.5   ------------------------------------------------------------------------------------------------------------------  Cardiac Enzymes Recent Labs  Lab 03/12/18 0007  TROPONINI <0.03   ------------------------------------------------------------------------------------------------------------------  RADIOLOGY:  Ct Head Wo Contrast  Result Date: 03/12/2018 CLINICAL DATA:  Seizures EXAM: CT HEAD WITHOUT CONTRAST TECHNIQUE: Contiguous axial images were obtained from the base of the skull through the vertex without intravenous contrast. COMPARISON:  None. FINDINGS: Brain: No acute territorial infarction, hemorrhage or intracranial mass. The ventricles are nonenlarged. Vascular: No hyperdense vessels.  No unexpected calcification Skull: Trace fluid in the inferior right mastoid.  No fracture Sinuses/Orbits: Mild mucosal thickening in the ethmoid sinuses. No acute orbital abnormality Other: None IMPRESSION: Negative non contrasted CT appearance of the brain. Electronically Signed   By: Jasmine Pang M.D.   On: 03/12/2018 01:58   Dg Chest Port 1 View  Result Date: 03/12/2018 CLINICAL DATA:  Multiple seizures at home. History of  diabetes. History of hypertension. Nonsmoker. EXAM: PORTABLE CHEST 1 VIEW COMPARISON:  None. FINDINGS: The heart size and mediastinal contours are within normal limits. Both lungs are clear. The visualized skeletal structures are unremarkable. IMPRESSION: No active disease. Electronically Signed   By: Burman NievesWilliam  Stevens M.D.   On: 03/12/2018 00:25     ASSESSMENT AND PLAN:  42 year old female patient with history of seizure disorder, hypertension, diabetes mellitus type 2 under hospitalist service for seizures  -Breakthrough seizure secondary to noncompliance with medication Resume Vimpat, Topamax and Lamictal Will start vimpat IV Status post neurology evaluation Monitor for any new seizures  -Diabetes mellitus type 2 Diabetic diet with sliding scale coverage with insulin  -DVT prophylaxis with [place] lovenox   All the records are reviewed and case discussed with Care Management/Social Worker. Management plans discussed with the patient, family and they are in agreement.  CODE STATUS: Full code  DVT Prophylaxis: SCDs  TOTAL TIME TAKING CARE OF THIS PATIENT: 36 minutes.   POSSIBLE D/C IN 1 DAYS, DEPENDING ON CLINICAL CONDITION.  Ihor AustinPavan Pyreddy M.D on 03/12/2018 at 1:41 PM  Between 7am to 6pm - Pager - [phone]  After 6pm go to [email or web address] - password [email or web address]  [facility]  Office  [phone]  CC: Primary care physician; Thomes DinningKistner, Lisa, MD  Note: This dictation was prepared with Dragon dictation along with smaller phrase technology. Any transcriptional errors that result from this process are unintentional.

## 2018-03-12 NOTE — ED Provider Notes (Signed)
[facility] Emergency Department Provider Note   ____________________________________________   First MD Initiated Contact with Patient 03/12/18 0000     (approximate)  I have reviewed the triage vital signs and the nursing notes.   HISTORY  Chief Complaint Seizures  Level V caveat: Limited by distress  HPI Sara [name] is a 42 y.o. female brought to the ED from home via EMS with a chief complaint of seizures.  Patient has a history of epilepsy, on Vimpat, Lamictal and Topamax.  She has been out of town and has missed 2 days worth of her medicines.  Per daughter, they were sitting and talking when patient began to have seizures.  EMS reports 5 seizures prior to arrival.  Patient received 2 mg IV Versed per EMS en route to the ED.  Arrives with seizure-like activity to the left upper extremity.  Patient is able to speak to me through her seizure-like activity and tells me she has migraines and currently has a headache.  She is able to tell me the medication she is on.  Denies recent fever, chills, chest pain, shortness of breath, abdominal pain, nausea, vomiting or dysuria.  Denies recent fall/injury/trauma.   Past Medical History:  Diagnosis Date  . Diabetes mellitus without complication (HCC)   . Hypertension   . Seizures [facility])     Patient Active Problem List   Diagnosis Date Noted  . Seizure (HCC) 03/12/2018  . Low back pain 05/22/2015  . Type 1 diabetes mellitus (HCC) 04/04/2015  . Epilepsy (HCC) 04/04/2015  . Muscle spasm of calf 04/04/2015  . Ehlers-Danlos syndrome type III 04/04/2015  . Lower leg pain 04/04/2015  . Migraine 04/04/2015     Prior to Admission medications   Medication Sig Start Date End Date Taking? Authorizing Provider  EMGALITY 120 MG/ML SOAJ Inject 120 mg into the skin every 30 (thirty) days. 03/05/18  Yes [provider]  metFORMIN (GLUCOPHAGE-XR) 500 MG 24 hr tablet Take 500 mg by mouth 2 (two) times  daily. 03/05/18  Yes [provider]  valACYclovir (VALTREX) 1000 MG tablet Take 1,000 mg by mouth daily. 12/16/17  Yes [provider]  doxepin (SINEQUAN) 10 MG capsule Take 1 capsule (10 mg total) by mouth 2 (two) times daily. Patient not taking: Reported on 03/12/2018 05/13/15   Enid Baas, MD    Allergies Latex and Tape  No family history on file.  Social History Social History   Tobacco Use  . Smoking status: Never Smoker  Substance Use Topics  . Alcohol use: Not on file  . Drug use: Never    Review of Systems  Constitutional: No fever/chills Eyes: No visual changes. ENT: No sore throat. Cardiovascular: Denies chest pain. Respiratory: Denies shortness of breath. Gastrointestinal: No abdominal pain.  No nausea, no vomiting.  No diarrhea.  No constipation. Genitourinary: Negative for dysuria. Musculoskeletal: Negative for back pain. Skin: Negative for rash. Neurological: Positive for seizures.  Negative for headaches, focal weakness or numbness.   ____________________________________________   PHYSICAL EXAM:  VITAL SIGNS: ED Triage Vitals  Enc Vitals Group     BP      Pulse      Resp      Temp      Temp src      SpO2      Weight      Height      Head Circumference      Peak Flow      Pain Score  Pain Loc      Pain Edu?      Excl. in GC?     Constitutional: Alert and oriented. Well appearing and in moderate acute distress. Eyes: Conjunctivae are normal. PERRL. EOMI. Head: Atraumatic. Nose: No internal evidence of injury. Mouth/Throat: Mucous membranes are moist.  Did not bite tongue. Neck: No stridor.  Supple neck without meningismus. Cardiovascular: Normal rate, regular rhythm. Grossly normal heart sounds.  Good peripheral circulation. Respiratory: Normal respiratory effort.  No retractions. Lungs CTAB. Gastrointestinal: Soft and nontender. No distention. No abdominal bruits. No CVA tenderness. Musculoskeletal: No lower  extremity tenderness nor edema.  No joint effusions. Neurologic: Active seizure-like activity to left upper extremity.  Normal speech and language. No gross focal neurologic deficits are appreciated. MAEx4. Skin:  Skin is warm, dry and intact. No rash noted. Psychiatric: Mood and affect are normal. Speech and behavior are normal.  ____________________________________________   LABS (all labs ordered are listed, but only abnormal results are displayed)  Labs Reviewed  CBC WITH DIFFERENTIAL/PLATELET - Abnormal; Notable for the following components:      Result Value   MCH 25.3 (*)    MCHC 31.0 (*)    All other components within normal limits  COMPREHENSIVE METABOLIC PANEL - Abnormal; Notable for the following components:   Glucose, Bld 411 (*)    Calcium 8.6 (*)    All other components within normal limits  ETHANOL - Abnormal; Notable for the following components:   Alcohol, Ethyl (B) 116 (*)    All other components within normal limits  URINALYSIS, COMPLETE (UACMP) WITH MICROSCOPIC - Abnormal; Notable for the following components:   Color, Urine STRAW (*)    APPearance CLEAR (*)    Glucose, UA >=500 (*)    Ketones, ur 5 (*)    All other components within normal limits  ACETAMINOPHEN LEVEL - Abnormal; Notable for the following components:   Acetaminophen (Tylenol), Serum <10 (*)    All other components within normal limits  TROPONIN I  URINE DRUG SCREEN, QUALITATIVE ([facility] ONLY)  HCG, QUANTITATIVE, PREGNANCY  SALICYLATE LEVEL  PREGNANCY, URINE   ____________________________________________  EKG  ED ECG REPORT I, Kamorah Nevils J, the attending physician, personally viewed and interpreted this ECG.   Date: 03/12/2018  EKG Time: 0345  Rate: 117  Rhythm: sinus tachycardia  Axis: Normal  Intervals:none  ST&T Change: Nonspecific  ____________________________________________  RADIOLOGY  ED MD interpretation: No ICH, no acute cardiopulmonary process  Official radiology  report(s): Ct Head Wo Contrast  Result Date: 03/12/2018 CLINICAL DATA:  Seizures EXAM: CT HEAD WITHOUT CONTRAST TECHNIQUE: Contiguous axial images were obtained from the base of the skull through the vertex without intravenous contrast. COMPARISON:  None. FINDINGS: Brain: No acute territorial infarction, hemorrhage or intracranial mass. The ventricles are nonenlarged. Vascular: No hyperdense vessels.  No unexpected calcification Skull: Trace fluid in the inferior right mastoid.  No fracture Sinuses/Orbits: Mild mucosal thickening in the ethmoid sinuses. No acute orbital abnormality Other: None IMPRESSION: Negative non contrasted CT appearance of the brain. Electronically Signed   By: Jasmine Pang M.D.   On: 03/12/2018 01:58   Dg Chest Port 1 View  Result Date: 03/12/2018 CLINICAL DATA:  Multiple seizures at home. History of diabetes. History of hypertension. Nonsmoker. EXAM: PORTABLE CHEST 1 VIEW COMPARISON:  None. FINDINGS: The heart size and mediastinal contours are within normal limits. Both lungs are clear. The visualized skeletal structures are unremarkable. IMPRESSION: No active disease. Electronically Signed   By: Marisa Cyphers.D.  On: 03/12/2018 00:25    ____________________________________________   PROCEDURES  Procedure(s) performed: None  Procedures  Critical Care performed: Yes, see critical care note(s)   CRITICAL CARE Performed by: Irean HongSUNG,Talaysia Pinheiro J   Total critical care time: 45 minutes  Critical care time was exclusive of separately billable procedures and treating other patients.  Critical care was necessary to treat or prevent imminent or life-threatening deterioration.  Critical care was time spent personally by me on the following activities: development of treatment plan with patient and/or surrogate as well as nursing, discussions with consultants, evaluation of patient's response to treatment, examination of patient, obtaining history from patient or surrogate,  ordering and performing treatments and interventions, ordering and review of laboratory studies, ordering and review of radiographic studies, pulse oximetry and re-evaluation of patient's condition.  ____________________________________________   INITIAL IMPRESSION / ASSESSMENT AND PLAN / ED COURSE  As part of my medical decision making, I reviewed the following data within the electronic MEDICAL RECORD NUMBER Nursing notes reviewed and incorporated, Labs reviewed, EKG interpreted, Old chart reviewed, Radiograph reviewed, Discussed with admitting physician and Notes from prior ED visits   42 year old female with epilepsy who presents with multiple seizures prior to arrival.  Differential diagnosis includes but is not limited to ICH, CVA, seizure disorder, seizure secondary to missed medication dosages, infectious, metabolic etiologies, etc.  I have personally reviewed patient's records.  I see she saw her PCP on 7/16 and placed on Augmentin for ear infection/TM rupture.  At that time her sugars were labile.  EMS reports FSBS 450.   Clinical Course as of Mar 12 346  Sat Mar 12, 2018  0045 Patient was given an additional 2 mg IV Ativan by my colleague who noted patient was looking around the room during her seizure-like activity and she was responding to him during the episode.  Seizure-like activity immediately stopped.  Patient did not have postictal episode.  We will continue to monitor.   [JS]  0207 Updated patient and her family on all the lab results.  Patient eager for discharge home.  Explained to her I would like to observe her for a period of time and recheck her blood sugar.  Patient verbalizes understanding.   [JS]  0219 Patient soundly asleep.  Heart rate in the 120s after 2 L IV fluids.  Daughter at bedside.  Updated her daughter and recommended admission to the hospital for multiple seizures and observation.  Will discuss with hospitalist to evaluate patient in the emergency department  for admission.   [JS]    Clinical Course User Index [JS] Irean HongSung, Jesten Cappuccio J, MD     ____________________________________________   FINAL CLINICAL IMPRESSION(S) / ED DIAGNOSES  Final diagnoses:  Seizure-like activity (HCC)  Alcoholic intoxication without complication (HCC)  Tachycardia     ED Discharge Orders    None       Note:  This document was prepared using Dragon voice recognition software and may include unintentional dictation errors.    Irean HongSung, Eboney Claybrook J, MD 03/12/18 [phone]

## 2018-03-12 NOTE — Plan of Care (Signed)
Pt expressed desire to go home.  Explained that would be against medical advice considering she had an active seizure a few hours ago.  Confirmed this with Dr. Tobi BastosPyreddy.  If pt doesn't have any more seizures, she will probably go home tomorrow having been restarted on her medications.

## 2018-03-12 NOTE — H&P (Signed)
Sara [name] is an 42 y.o. female.   Chief Complaint: Seizures HPI: The patient with past medical history of epilepsy, hypertension and diabetes presents to the emergency department after having multiple seizures today.  The patient's daughter states that she had multiple tonic-clonic seizures with episodes of unconsciousness.  In the emergency department the patient was conscious but displayed some unilateral tonic-clonic movement.  However she was also observed to respond during said movement.  Nonetheless the patient received Lamictal and Vimpat in the emergency department prior to the staff calling the hospitalist service for admission.  Past Medical History:  Diagnosis Date  . Diabetes mellitus without complication ([place])   . Hypertension   . Seizures ([place])     Past Surgical History:  Procedure Laterality Date  . CARPAL TUNNEL RELEASE    . CERVICAL FUSION    . CESAREAN SECTION  x3  . HIP ARTHROSCOPY    . PARTIAL HYSTERECTOMY      Family History  Problem Relation Age of Onset  . Hypertension Other    Social History:  reports that she has never smoked. She has never used smokeless tobacco. She reports that she drinks alcohol. She reports that she does not use drugs.  Allergies:  Allergies  Allergen Reactions  . Latex   . Tape     Medications Prior to Admission  Medication Sig Dispense Refill  . EMGALITY 120 MG/ML SOAJ Inject 120 mg into the skin every 30 (thirty) days.    . metFORMIN (GLUCOPHAGE-XR) 500 MG 24 hr tablet Take 500 mg by mouth 2 (two) times daily.    . valACYclovir (VALTREX) 1000 MG tablet Take 1,000 mg by mouth daily.  2  . doxepin (SINEQUAN) 10 MG capsule Take 1 capsule (10 mg total) by mouth 2 (two) times daily. (Patient not taking: Reported on 03/12/2018) 60 capsule 0    Results for orders placed or performed during the hospital encounter of 03/11/18 (from the past 48 hour(s))  CBC with Differential     Status: Abnormal   Collection Time:  03/12/18 12:07 AM  Result Value Ref Range   WBC 6.7 3.6 - 11.0 K/uL   RBC 4.95 3.80 - 5.20 MIL/uL   Hemoglobin 12.5 12.0 - 16.0 g/dL   HCT 40.4 35.0 - 47.0 %   MCV 81.5 80.0 - 100.0 fL   MCH 25.3 (L) 26.0 - 34.0 pg   MCHC 31.0 (L) 32.0 - 36.0 g/dL   RDW 14.5 11.5 - 14.5 %   Platelets 188 150 - 440 K/uL   Neutrophils Relative % 66 %   Neutro Abs 4.4 1.4 - 6.5 K/uL   Lymphocytes Relative 27 %   Lymphs Abs 1.8 1.0 - 3.6 K/uL   Monocytes Relative 5 %   Monocytes Absolute 0.3 0.2 - 0.9 K/uL   Eosinophils Relative 1 %   Eosinophils Absolute 0.1 0 - 0.7 K/uL   Basophils Relative 1 %   Basophils Absolute 0.0 0 - 0.1 K/uL    Comment: Performed at [facility], [address] [place], [place] [address]  Comprehensive metabolic panel     Status: Abnormal   Collection Time: 03/12/18 12:07 AM  Result Value Ref Range   Sodium 140 135 - 145 mmol/L   Potassium 4.2 3.5 - 5.1 mmol/L   Chloride 103 98 - 111 mmol/L   CO2 22 22 - 32 mmol/L   Glucose, Bld 411 (H) 70 - 99 mg/dL   BUN 11 6 - 20 mg/dL  Creatinine, Ser 0.74 0.44 - 1.00 mg/dL   Calcium 8.6 (L) 8.9 - 10.3 mg/dL   Total Protein 7.1 6.5 - 8.1 g/dL   Albumin 3.7 3.5 - 5.0 g/dL   AST 24 15 - 41 U/L   ALT 21 0 - 44 U/L   Alkaline Phosphatase 78 38 - 126 U/L   Total Bilirubin 0.5 0.3 - 1.2 mg/dL   GFR calc non Af Amer >60 >60 mL/min   GFR calc Af Amer >60 >60 mL/min    Comment: (NOTE) The eGFR has been calculated using the CKD EPI equation. This calculation has not been validated in all clinical situations. eGFR's persistently <60 mL/min signify possible Chronic Kidney Disease.    Anion gap 15 5 - 15    Comment: Performed at [facility], [address] [place], [place] [address]  Ethanol     Status: Abnormal   Collection Time: 03/12/18 12:07 AM  Result Value Ref Range   Alcohol, Ethyl (B) 116 (H) <10 mg/dL    Comment: (NOTE) Lowest detectable limit for serum alcohol is 10 mg/dL. For medical purposes  only. Performed at [facility], [address] [place], [place] [address]   Troponin I     Status: None   Collection Time: 03/12/18 12:07 AM  Result Value Ref Range   Troponin I <0.03 <0.03 ng/mL    Comment: Performed at [facility], [address] [place], [place] [address]  Urinalysis, Complete w Microscopic     Status: Abnormal   Collection Time: 03/12/18 12:07 AM  Result Value Ref Range   Color, Urine STRAW (A) YELLOW   APPearance CLEAR (A) CLEAR   Specific Gravity, Urine 1.022 1.005 - 1.030   pH 6.0 5.0 - 8.0   Glucose, UA >=500 (A) NEGATIVE mg/dL   Hgb urine dipstick NEGATIVE NEGATIVE   Bilirubin Urine NEGATIVE NEGATIVE   Ketones, ur 5 (A) NEGATIVE mg/dL   Protein, ur NEGATIVE NEGATIVE mg/dL   Nitrite NEGATIVE NEGATIVE   Leukocytes, UA NEGATIVE NEGATIVE   WBC, UA 0-5 0 - 5 WBC/hpf   Bacteria, UA NONE SEEN NONE SEEN   Squamous Epithelial / LPF 0-5 0 - 5    Comment: Performed at [facility], [address] [place], [place] [address]  Urine Drug Screen, Qualitative     Status: None   Collection Time: 03/12/18 12:07 AM  Result Value Ref Range   Tricyclic, Ur Screen NONE DETECTED NONE DETECTED   Amphetamines, Ur Screen NONE DETECTED NONE DETECTED   MDMA (Ecstasy)Ur Screen NONE DETECTED NONE DETECTED   Cocaine Metabolite,Ur [place] NONE DETECTED NONE DETECTED   Opiate, Ur Screen NONE DETECTED NONE DETECTED   Phencyclidine (PCP) Ur S NONE DETECTED NONE DETECTED   Cannabinoid 50 Ng, Ur [place] NONE DETECTED NONE DETECTED   Barbiturates, Ur Screen NONE DETECTED NONE DETECTED   Benzodiazepine, Ur Scrn NONE DETECTED NONE DETECTED   Methadone Scn, Ur NONE DETECTED NONE DETECTED    Comment: (NOTE) Tricyclics + metabolites, urine    Cutoff 1000 ng/mL Amphetamines + metabolites, urine  Cutoff 1000 ng/mL MDMA (Ecstasy), urine              Cutoff 500 ng/mL Cocaine Metabolite, urine          Cutoff 300 ng/mL Opiate + metabolites, urine        Cutoff 300  ng/mL Phencyclidine (PCP), urine         Cutoff 25 ng/mL Cannabinoid, urine  Cutoff 50 ng/mL Barbiturates + metabolites, urine  Cutoff 200 ng/mL Benzodiazepine, urine              Cutoff 200 ng/mL Methadone, urine                   Cutoff 300 ng/mL The urine drug screen provides only a preliminary, unconfirmed analytical test result and should not be used for non-medical purposes. Clinical consideration and professional judgment should be applied to any positive drug screen result due to possible interfering substances. A more specific alternate chemical method must be used in order to obtain a confirmed analytical result. Gas chromatography / mass spectrometry (GC/MS) is the preferred confirmat ory method. Performed at [facility], [address] [place], [place] [address]   hCG, quantitative, pregnancy     Status: None   Collection Time: 03/12/18 12:07 AM  Result Value Ref Range   hCG, Beta Chain, Quant, S <1 <5 mIU/mL    Comment:          GEST. AGE      CONC.  (mIU/mL)   <=1 WEEK        5 - 50     2 WEEKS       50 - 500     3 WEEKS       100 - 10,000     4 WEEKS     1,000 - 30,000     5 WEEKS     3,500 - 115,000   6-8 WEEKS     12,000 - 270,000    12 WEEKS     15,000 - 220,000        FEMALE AND NON-PREGNANT FEMALE:     LESS THAN 5 mIU/mL Performed at [facility], [place], [place] [address]   Acetaminophen level     Status: Abnormal   Collection Time: 03/12/18 12:07 AM  Result Value Ref Range   Acetaminophen (Tylenol), Serum <10 (L) 10 - 30 ug/mL    Comment: (NOTE) Therapeutic concentrations vary significantly. A range of 10-30 ug/mL  may be an effective concentration for many patients. However, some  are best treated at concentrations outside of this range. Acetaminophen concentrations >150 ug/mL at 4 hours after ingestion  and >50 ug/mL at 12 hours after ingestion are often associated with  toxic  reactions. Performed at [facility], [address] [place], [place] [address]   Salicylate level     Status: None   Collection Time: 03/12/18 12:07 AM  Result Value Ref Range   Salicylate Lvl <7.6 2.8 - 30.0 mg/dL    Comment: Performed at [facility], [address] [place], [place] [address]  Pregnancy, urine     Status: None   Collection Time: 03/12/18 12:07 AM  Result Value Ref Range   Preg Test, Ur NEGATIVE NEGATIVE    Comment: Performed at [facility], [address] [place], [place] [address]  TSH     Status: None   Collection Time: 03/12/18 12:07 AM  Result Value Ref Range   TSH 0.529 0.350 - 4.500 uIU/mL    Comment: Performed by a 3rd Generation assay with a functional sensitivity of <=0.01 uIU/mL. Performed at [facility], [place], [place] [address]    Ct Head Wo Contrast  Result Date: 03/12/2018 CLINICAL DATA:  Seizures EXAM: CT HEAD WITHOUT CONTRAST TECHNIQUE: Contiguous axial images were obtained from the base of the skull through the  vertex without intravenous contrast. COMPARISON:  None. FINDINGS: Brain: No acute territorial infarction, hemorrhage or intracranial mass. The ventricles are nonenlarged. Vascular: No hyperdense vessels.  No unexpected calcification Skull: Trace fluid in the inferior right mastoid.  No fracture Sinuses/Orbits: Mild mucosal thickening in the ethmoid sinuses. No acute orbital abnormality Other: None IMPRESSION: Negative non contrasted CT appearance of the brain. Electronically Signed   By: Donavan Foil M.D.   On: 03/12/2018 01:58   Dg Chest Port 1 View  Result Date: 03/12/2018 CLINICAL DATA:  Multiple seizures at home. History of diabetes. History of hypertension. Nonsmoker. EXAM: PORTABLE CHEST 1 VIEW COMPARISON:  None. FINDINGS: The heart size and mediastinal contours are within normal limits. Both lungs are clear. The visualized skeletal structures are unremarkable. IMPRESSION: No  active disease. Electronically Signed   By: Lucienne Capers M.D.   On: 03/12/2018 00:25    Review of Systems  Constitutional: Negative for chills and fever.  HENT: Negative for sore throat and tinnitus.   Eyes: Negative for blurred vision and redness.  Respiratory: Negative for cough and shortness of breath.   Cardiovascular: Negative for chest pain, palpitations, orthopnea and PND.  Gastrointestinal: Negative for abdominal pain, diarrhea, nausea and vomiting.  Genitourinary: Negative for dysuria, frequency and urgency.  Musculoskeletal: Negative for joint pain and myalgias.  Skin: Negative for rash.       No lesions  Neurological: Positive for seizures and headaches. Negative for speech change, focal weakness and weakness.  Endo/Heme/Allergies: Does not bruise/bleed easily.       No temperature intolerance  Psychiatric/Behavioral: Negative for depression and suicidal ideas.    Blood pressure 120/72, pulse (!) 120, temperature 98.6 F (37 C), temperature source Oral, resp. rate (!) 21, height 5' 6" (1.676 m), weight 85.3 kg (188 lb), SpO2 97 %. Physical Exam  Vitals reviewed. Constitutional: She is oriented to person, place, and time. She appears well-developed and well-nourished. No distress.  HENT:  Head: Normocephalic and atraumatic.  Mouth/Throat: Oropharynx is clear and moist.  Eyes: Pupils are equal, round, and reactive to light. Conjunctivae and EOM are normal. No scleral icterus.  Neck: Normal range of motion. Neck supple. No JVD present. No tracheal deviation present. No thyromegaly present.  Cardiovascular: Normal rate, regular rhythm and normal heart sounds. Exam reveals no gallop and no friction rub.  No murmur heard. Respiratory: Effort normal and breath sounds normal. No respiratory distress.  GI: Soft. Bowel sounds are normal. She exhibits no distension. There is no tenderness.  Genitourinary:  Genitourinary Comments: Deferred  Musculoskeletal: Normal range of  motion. She exhibits no edema.  Lymphadenopathy:    She has no cervical adenopathy.  Neurological: She is alert and oriented to person, place, and time. No cranial nerve deficit. She exhibits normal muscle tone.  Skin: Skin is warm and dry. No rash noted. No erythema.  Psychiatric: She has a normal mood and affect. Her behavior is normal. Judgment and thought content normal.     Assessment/Plan This is a 42 year old female admitted for seizures. 1.  Seizures: It appears the patient has not been filling her antiepileptic medication for at least 4 months.  Also given some of her seizure behavior differential diagnosis of seizure activity could be pseudoseizures.  Consult neurology. 2.  Hypertension: Controlled 3.  Diabetes mellitus type 2: Hold metformin; sliding scale insulin while hospitalized 4.  DVT prophylaxis: Lovenox 5.  GI prophylaxis: None The patient is a full code.  Time spent on admission orders and patient care  approximately 45 minutes  Harrie Foreman, MD 03/12/2018, 6:42 AM

## 2018-03-12 NOTE — Consult Note (Signed)
Reason for Consult: seizures  Referring Physician: Dr. Tobi Bastos   CC: seizures   HPI: Sara [name] is an 42 y.o. female with suspected medical history of epilepsy, hypertension and diabetes presents to the emergency department after having multiple seizures today.  At times it was noted she was shaking but was able to speak.   Back to baseline now.  As per pt increased stress recently as she is going through a divorce.  At home on Vimpat 200 BID, Topamax 100 BID, and lamictal 100 BID as per family who called home to look at her home medications.      Past Medical History:  Diagnosis Date  . Diabetes mellitus without complication (HCC)   . Hypertension   . Seizures (HCC)     Past Surgical History:  Procedure Laterality Date  . CARPAL TUNNEL RELEASE    . CERVICAL FUSION    . CESAREAN SECTION  x3  . HIP ARTHROSCOPY    . PARTIAL HYSTERECTOMY      Family History  Problem Relation Age of Onset  . Hypertension Other     Social History:  reports that she has never smoked. She has never used smokeless tobacco. She reports that she drinks alcohol. She reports that she does not use drugs.  Allergies  Allergen Reactions  . Latex   . Tape     Medications: I have reviewed the patient's current medications.  ROS: History obtained from the patient  General ROS: negative for - chills, fatigue, fever, night sweats, weight gain or weight loss Psychological ROS: negative for - behavioral disorder, hallucinations, memory difficulties, mood swings or suicidal ideation Ophthalmic ROS: negative for - blurry vision, double vision, eye pain or loss of vision ENT ROS: negative for - epistaxis, nasal discharge, oral lesions, sore throat, tinnitus or vertigo Allergy and Immunology ROS: negative for - hives or itchy/watery eyes Hematological and Lymphatic ROS: negative for - bleeding problems, bruising or swollen lymph nodes Endocrine ROS: negative for - galactorrhea, hair pattern  changes, polydipsia/polyuria or temperature intolerance Respiratory ROS: negative for - cough, hemoptysis, shortness of breath or wheezing Cardiovascular ROS: negative for - chest pain, dyspnea on exertion, edema or irregular heartbeat Gastrointestinal ROS: negative for - abdominal pain, diarrhea, hematemesis, nausea/vomiting or stool incontinence Genito-Urinary ROS: negative for - dysuria, hematuria, incontinence or urinary frequency/urgency Musculoskeletal ROS: negative for - joint swelling or muscular weakness Neurological ROS: as noted in HPI Dermatological ROS: negative for rash and skin lesion changes  Physical Examination: Blood pressure 120/72, pulse (!) 120, temperature 98.6 F (37 C), temperature source Oral, resp. rate (!) 21, height 5\' 6"  (1.676 m), weight 188 lb (85.3 kg), SpO2 97 %.    Neurological Examination   Mental Status: Alert, oriented, thought content appropriate.  Speech fluent without evidence of aphasia.  Able to follow 3 step commands without difficulty. Cranial Nerves: II: Discs flat bilaterally; Visual fields grossly normal, pupils equal, round, reactive to light and accommodation III,IV, VI: ptosis not present, extra-ocular motions intact bilaterally V,VII: smile symmetric, facial light touch sensation normal bilaterally VIII: hearing normal bilaterally IX,X: gag reflex present XI: bilateral shoulder shrug XII: midline tongue extension Motor: Right : Upper extremity   5/5    Left:     Upper extremity   5/5  Lower extremity   5/5     Lower extremity   5/5 Tone and bulk:normal tone throughout; no atrophy noted Sensory: Pinprick and light touch intact throughout, bilaterally Deep Tendon Reflexes: 2+ and symmetric throughout  Plantars: Right: downgoing   Left: downgoing Cerebellar: normal finger-to-nose Gait: not tested      Laboratory Studies:   Basic Metabolic Panel: Recent Labs  Lab 03/12/18 0007  NA 140  K 4.2  CL 103  CO2 22  GLUCOSE 411*   BUN 11  CREATININE 0.74  CALCIUM 8.6*    Liver Function Tests: Recent Labs  Lab 03/12/18 0007  AST 24  ALT 21  ALKPHOS 78  BILITOT 0.5  PROT 7.1  ALBUMIN 3.7   No results for input(s): LIPASE, AMYLASE in the last 168 hours. No results for input(s): AMMONIA in the last 168 hours.  CBC: Recent Labs  Lab 03/12/18 0007  WBC 6.7  NEUTROABS 4.4  HGB 12.5  HCT 40.4  MCV 81.5  PLT 188    Cardiac Enzymes: Recent Labs  Lab 03/12/18 0007  TROPONINI <0.03    BNP: Invalid input(s): POCBNP  CBG: Recent Labs  Lab 03/12/18 0748 03/12/18 1204  GLUCAP 309* 220*    Microbiology: No results found for this or any previous visit.  Coagulation Studies: No results for input(s): LABPROT, INR in the last 72 hours.  Urinalysis:  Recent Labs  Lab 03/12/18 0007  COLORURINE STRAW*  LABSPEC 1.022  PHURINE 6.0  GLUCOSEU >=500*  HGBUR NEGATIVE  BILIRUBINUR NEGATIVE  KETONESUR 5*  PROTEINUR NEGATIVE  NITRITE NEGATIVE  LEUKOCYTESUR NEGATIVE    Lipid Panel:  No results found for: CHOL, TRIG, HDL, CHOLHDL, VLDL, LDLCALC  HgbA1C:  Lab Results  Component Value Date   HGBA1C 10.9 (H) 03/12/2018    Urine Drug Screen:      Component Value Date/Time   LABOPIA NONE DETECTED 03/12/2018 0007   COCAINSCRNUR NONE DETECTED 03/12/2018 0007   LABBENZ NONE DETECTED 03/12/2018 0007   AMPHETMU NONE DETECTED 03/12/2018 0007   THCU NONE DETECTED 03/12/2018 0007   LABBARB NONE DETECTED 03/12/2018 0007    Alcohol Level:  Recent Labs  Lab 03/12/18 0007  ETH 116*    Other results: EKG: normal EKG, normal sinus rhythm, unchanged from previous tracings.  Imaging: Ct Head Wo Contrast  Result Date: 03/12/2018 CLINICAL DATA:  Seizures EXAM: CT HEAD WITHOUT CONTRAST TECHNIQUE: Contiguous axial images were obtained from the base of the skull through the vertex without intravenous contrast. COMPARISON:  None. FINDINGS: Brain: No acute territorial infarction, hemorrhage or  intracranial mass. The ventricles are nonenlarged. Vascular: No hyperdense vessels.  No unexpected calcification Skull: Trace fluid in the inferior right mastoid.  No fracture Sinuses/Orbits: Mild mucosal thickening in the ethmoid sinuses. No acute orbital abnormality Other: None IMPRESSION: Negative non contrasted CT appearance of the brain. Electronically Signed   By: Jasmine Pang M.D.   On: 03/12/2018 01:58   Dg Chest Port 1 View  Result Date: 03/12/2018 CLINICAL DATA:  Multiple seizures at home. History of diabetes. History of hypertension. Nonsmoker. EXAM: PORTABLE CHEST 1 VIEW COMPARISON:  None. FINDINGS: The heart size and mediastinal contours are within normal limits. Both lungs are clear. The visualized skeletal structures are unremarkable. IMPRESSION: No active disease. Electronically Signed   By: Burman Nieves M.D.   On: 03/12/2018 00:25     Assessment/Plan:  42 y.o. female with suspected medical history of epilepsy, hypertension and diabetes presents to the emergency department after having multiple seizures today.  At times it was noted she was shaking but was able to speak.   Back to baseline now.  As per pt increased stress recently as she is going through a divorce.  At  home on Vimpat 200 BID, Topamax 100 BID, and lamictal 100 BID as per family who called home to look at her home medications.    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

## 2018-03-12 NOTE — ED Notes (Signed)
Pt has external catheter in place but pt found with bed wet. Bed and linen changed at this time with perineal cleaning done.

## 2018-03-12 NOTE — ED Triage Notes (Signed)
pt to the er for seizures. 5 reported at home. Gave versed 2mg  en route. Blood sugar 450. Type 1 diabetic. out of iusulin. She had insulin today. Left arm starts shaking AND THEN SEIZURE STARTS. bp 120/68. hr 124.

## 2018-03-12 NOTE — Progress Notes (Signed)
Pt requesting fiorecet for head ache, pain 10/10.  Came into the room with medication and pt asleep, did not wake to voice. Henriette CombsSarah Gadiel John RN

## 2018-03-12 NOTE — Plan of Care (Signed)
Went in to give pt medicine and found her actively seizing. Melina Schoolsobyn, RN was tending patient.  Ran out to get ativan - no order present.  Paged Pyreddy and he put in order for 2 mg ativan.  Returned and pt had stopped seizing.  She was responding verbally -. Pt can't take oral meds right now.  Requested Dr to change Vimpat to IV.  He just responded and sd he would.  Pt is sleeping currently.

## 2018-03-13 DIAGNOSIS — G40409 Other generalized epilepsy and epileptic syndromes, not intractable, without status epilepticus: Secondary | ICD-10-CM | POA: Diagnosis not present

## 2018-03-13 LAB — GLUCOSE, CAPILLARY: Glucose-Capillary: 319 mg/dL — ABNORMAL HIGH (ref 70–99)

## 2018-03-13 MED ORDER — LACOSAMIDE 150 MG PO TABS: 150.0000 mg | ORAL_TABLET | Freq: Two times a day (BID) | ORAL | 0 refills | Status: DC

## 2018-03-13 MED ORDER — TOPIRAMATE 100 MG PO TABS: 100.0000 mg | ORAL_TABLET | Freq: Four times a day (QID) | ORAL | 0 refills | Status: AC

## 2018-03-13 MED ORDER — LAMOTRIGINE 100 MG PO TABS: 100.0000 mg | ORAL_TABLET | Freq: Two times a day (BID) | ORAL | 0 refills | Status: DC

## 2018-03-13 NOTE — Progress Notes (Signed)
Discharge instructions given and went over with patient at bedside. Prescriptions given and reviewed. All questions answered. Patient discharged home. Chimamanda Siegfried S, RN  

## 2018-03-13 NOTE — Progress Notes (Signed)
Advanced care plan. Purpose of the Encounter: CODE STATUS Parties in Attendance: Patient Patient's Decision Capacity: Good Subjective/Patient's story: Presented to emergency room for seizure Objective/Medical story Patient has been noncompliant with her seizure medication Has breakthrough seizure Goals of care determination:  Advance care directives and goals of care were discussed during the patient's hospitalization Important stress was given regarding the compliance with the medication Patient wants everything done for now which includes CPR, intubation if the need arises CODE STATUS: Full code Time spent discussing advanced care planning: 16 minutes

## 2018-03-13 NOTE — Plan of Care (Signed)

## 2018-03-13 NOTE — Discharge Summary (Signed)
[facility] [place] [facility]   PATIENT NAME: Sara [name]    MR#:  [id number]  DATE OF BIRTH:  [birth year]  DATE OF ADMISSION:  03/11/2018 ADMITTING PHYSICIAN: Arnaldo Natal, MD  DATE OF DISCHARGE: 03/13/2018 10:20 AM  PRIMARY CARE PHYSICIAN: Thomes Dinning, MD   ADMISSION DIAGNOSIS:  Tachycardia [R00.0] Seizure-like activity (HCC) [R56.9] Alcoholic intoxication without complication (HCC) [F10.920]  DISCHARGE DIAGNOSIS:  Breakthrough seizure Hypertension Type 2 diabetes mellitus  SECONDARY DIAGNOSIS:   Past Medical History:  Diagnosis Date  . Diabetes mellitus without complication (HCC)   . Hypertension   . Seizures (HCC)      ADMITTING HISTORY The patient with past medical history of epilepsy, hypertension and diabetes presents to the emergency department after having multiple seizures today.  The patient's daughter states that she had multiple tonic-clonic seizures with episodes of unconsciousness.  In the emergency department the patient was conscious but displayed some unilateral tonic-clonic movement.  However she was also observed to respond during said movement.  Nonetheless the patient received Lamictal and Vimpat in the emergency department prior to the staff calling the hospitalist service for admission  HOSPITAL COURSE:  And was admitted to medical floor and was seen by neurology attending.  Patient's seizure medications were restarted which includes Vimpat, Lamictal and Topamax.  Patient was worked up with CT head and chest x-ray which showed no acute abnormality.  Patient tolerated medications well.  She will be discharged home follow-up with neurology as outpatient.  CONSULTS OBTAINED:  Treatment Team:  Pauletta Browns, MD  DRUG ALLERGIES:   Allergies  Allergen Reactions  . Latex   . Tape     DISCHARGE MEDICATIONS:   Allergies as of 03/13/2018      Reactions   Latex    Tape       Medication List    TAKE  these medications   albuterol 108 (90 Base) MCG/ACT inhaler Commonly known as:  PROVENTIL HFA;VENTOLIN HFA Inhale 2 to 3 puffs into the lungs every 6 hours as needed (respiratory problems)   ALPRAZolam 0.5 MG tablet Commonly known as:  XANAX Take 0.5 mg by mouth daily as needed for anxiety or sleep.   augmented betamethasone dipropionate 0.05 % ointment Commonly known as:  DIPROLENE-AF Apply 1 application topically daily as needed (for itchy and/or raised skin spots).   benzoyl peroxide 10 % Liqd Generic drug:  Benzoyl Peroxide Apply 1 application topically daily.   ciclopirox 8 % solution Commonly known as:  PENLAC Apply topically at bedtime. Apply over nail and surrounding skin. Apply daily over previous coat. After seven (7) days, may remove with alcohol and continue cycle.   clindamycin 1 % external solution Commonly known as:  CLEOCIN T Apply 1 application topically 2 (two) times daily. (face, chest, back and shoulders)   cyclobenzaprine 10 MG tablet Commonly known as:  FLEXERIL Take 10 mg by mouth 3 (three) times daily as needed for muscle spasms.   Diclofenac Sodium 1.5 % Soln Place 40 drops onto the skin 4 (four) times daily as needed (pain/inflammation).   dicyclomine 10 MG capsule Commonly known as:  BENTYL Take 10 mg by mouth 3 (three) times daily as needed for spasms.   DULoxetine 60 MG capsule Commonly known as:  CYMBALTA Take 60 mg by mouth daily.   EMGALITY 120 MG/ML Soaj Generic drug:  Galcanezumab-gnlm Inject 120 mg into the skin every 28 (twenty-eight) days.   ergocalciferol 50000 units capsule Commonly known as:  VITAMIN  D2 Take 50,000 Units by mouth 2 (two) times a week.   ferrous sulfate 325 (65 FE) MG tablet Take 325 mg by mouth daily with breakfast.   glucagon 1 MG Solr injection Commonly known as:  GLUCAGEN Inject 1 mg into the vein once as needed for low blood sugar.   hydroquinone 4 % cream Apply 1 application topically daily. (up to  90 days)   insulin aspart 100 UNIT/ML injection Commonly known as:  novoLOG For insulin pump - administration average 160u over course of day   insulin detemir 100 UNIT/ML injection Commonly known as:  LEVEMIR Inject 40 Units into the skin 2 (two) times daily. (USE ONLY IF INSULIN PUMP FAILS)   Lacosamide 150 MG Tabs Commonly known as:  VIMPAT Take 1 tablet (150 mg total) by mouth 2 (two) times daily.   lamoTRIgine 100 MG tablet Commonly known as:  LAMICTAL Take 1 tablet (100 mg total) by mouth 2 (two) times daily.   magnesium oxide 400 MG tablet Commonly known as:  MAG-OX Take 400 mg by mouth daily.   metaxalone 800 MG tablet Commonly known as:  SKELAXIN Take 800 mg by mouth 3 (three) times daily as needed for muscle spasms.   metFORMIN 500 MG 24 hr tablet Commonly known as:  GLUCOPHAGE-XR Take 500 mg by mouth 2 (two) times daily.   methocarbamol 750 MG tablet Commonly known as:  ROBAXIN Take 750 mg by mouth 3 (three) times daily as needed for muscle spasms.   montelukast 10 MG tablet Commonly known as:  SINGULAIR Take 10 mg by mouth at bedtime.   potassium chloride 10 MEQ tablet Commonly known as:  K-DUR,KLOR-CON Take 10 mEq by mouth daily.   rizatriptan 10 MG tablet Commonly known as:  MAXALT Take 10 mg by mouth as needed for migraine. May repeat in 2 hours if needed   rosuvastatin 20 MG tablet Commonly known as:  CRESTOR Take 20 mg by mouth daily.   topiramate 100 MG tablet Commonly known as:  TOPAMAX Take 1 tablet (100 mg total) by mouth 4 (four) times daily.   triamcinolone ointment 0.1 % Commonly known as:  KENALOG Apply to skin lesions on the legs twice daily as directed   valACYclovir 1000 MG tablet Commonly known as:  VALTREX Take 1,000 mg by mouth daily.   vitamin B-12 1000 MCG tablet Commonly known as:  CYANOCOBALAMIN Take 1,000 mcg by mouth daily.       Today  Patient seen and evaluated today No new episodes of seizures Tolerating  diet well  VITAL SIGNS:  Blood pressure 106/71, pulse 96, temperature 97.7 F (36.5 C), temperature source Oral, resp. rate 14, height 5\' 6"  (1.676 m), weight 84.6 kg (186 lb 9.6 oz), SpO2 100 %.  I/O:    Intake/Output Summary (Last 24 hours) at 03/13/2018 1153 Last data filed at 03/13/2018 0900 Gross per 24 hour  Intake 390 ml  Output -  Net 390 ml    PHYSICAL EXAMINATION:  Physical Exam  GENERAL:  42 y.o.-year-old patient lying in the bed with no acute distress.  LUNGS: Normal breath sounds bilaterally, no wheezing, rales,rhonchi or crepitation. No use of accessory muscles of respiration.  CARDIOVASCULAR: S1, S2 normal. No murmurs, rubs, or gallops.  ABDOMEN: Soft, non-tender, non-distended. Bowel sounds present. No organomegaly or mass.  NEUROLOGIC: Moves all 4 extremities. PSYCHIATRIC: The patient is alert and oriented x 3.  SKIN: No obvious rash, lesion, or ulcer.   DATA REVIEW:   CBC Recent  Labs  Lab 03/12/18 0007  WBC 6.7  HGB 12.5  HCT 40.4  PLT 188    Chemistries  Recent Labs  Lab 03/12/18 0007  NA 140  K 4.2  CL 103  CO2 22  GLUCOSE 411*  BUN 11  CREATININE 0.74  CALCIUM 8.6*  AST 24  ALT 21  ALKPHOS 78  BILITOT 0.5    Cardiac Enzymes Recent Labs  Lab 03/12/18 0007  TROPONINI <0.03    Microbiology Results  No results found for this or any previous visit.  RADIOLOGY:  Ct Head Wo Contrast  Result Date: 03/12/2018 CLINICAL DATA:  Seizures EXAM: CT HEAD WITHOUT CONTRAST TECHNIQUE: Contiguous axial images were obtained from the base of the skull through the vertex without intravenous contrast. COMPARISON:  None. FINDINGS: Brain: No acute territorial infarction, hemorrhage or intracranial mass. The ventricles are nonenlarged. Vascular: No hyperdense vessels.  No unexpected calcification Skull: Trace fluid in the inferior right mastoid.  No fracture Sinuses/Orbits: Mild mucosal thickening in the ethmoid sinuses. No acute orbital abnormality  Other: None IMPRESSION: Negative non contrasted CT appearance of the brain. Electronically Signed   By: Jasmine Pang M.D.   On: 03/12/2018 01:58   Dg Chest Port 1 View  Result Date: 03/12/2018 CLINICAL DATA:  Multiple seizures at home. History of diabetes. History of hypertension. Nonsmoker. EXAM: PORTABLE CHEST 1 VIEW COMPARISON:  None. FINDINGS: The heart size and mediastinal contours are within normal limits. Both lungs are clear. The visualized skeletal structures are unremarkable. IMPRESSION: No active disease. Electronically Signed   By: Burman Nieves M.D.   On: 03/12/2018 00:25    Follow up with PCP in 1 week.  Management plans discussed with the patient, family and they are in agreement.  CODE STATUS: Full code    Code Status Orders  (From admission, onward)        Start     Ordered   03/12/18 0455  Full code  Continuous     03/12/18 0454    Code Status History    This patient has a current code status but no historical code status.      TOTAL TIME TAKING CARE OF THIS PATIENT ON DAY OF DISCHARGE: more than 34 minutes.   Ihor Austin M.D on 03/13/2018 at 11:53 AM  Between 7am to 6pm - Pager - [phone]  After 6pm go to [email or web address] - password [email or web address]  [facility]  Office  [phone]  CC: Primary care physician; Thomes Dinning, MD  Note: This dictation was prepared with Dragon dictation along with smaller phrase technology. Any transcriptional errors that result from this process are unintentional.

## 2019-06-11 IMAGING — DX DG CHEST 1V PORT
1 series · 1 of 1 positions shown · non-contrast
Comparison: None.

CLINICAL DATA: Multiple seizures at home. History of diabetes.
History of hypertension. Nonsmoker.

EXAM:
PORTABLE CHEST 1 VIEW

[chest ap]
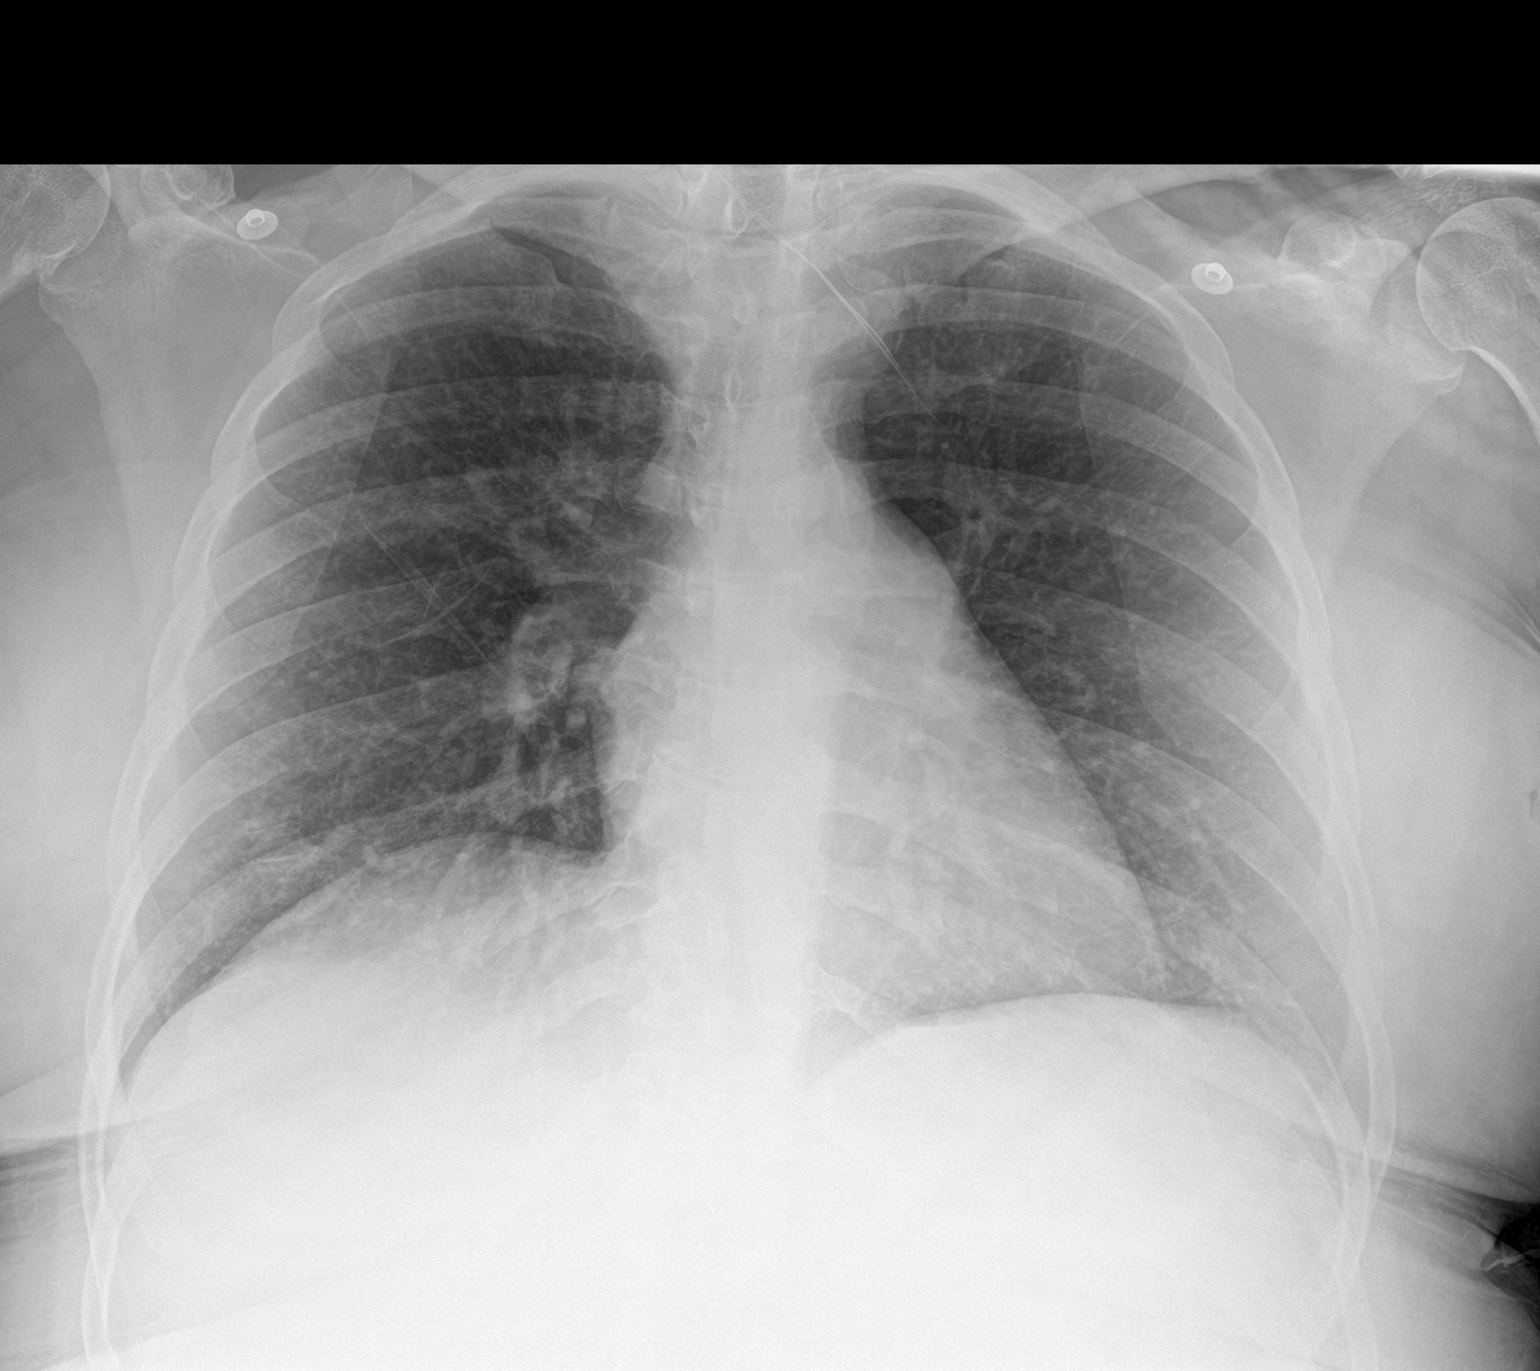

[1 of 1 positions shown; findings below may reference images not displayed]

FINDINGS: The heart size and mediastinal contours are within normal limits.
Both lungs are clear. The visualized skeletal structures are
unremarkable.
IMPRESSION: No active disease.

## 2020-04-07 ENCOUNTER — Other Ambulatory Visit: Payer: Self-pay

## 2020-04-07 ENCOUNTER — Emergency Department
Admission: EM | Admit: 2020-04-07 | Discharge: 2020-04-07 | Disposition: A | Discharge status: Home / Self Care | Payer: Medicare Other | Attending: Emergency Medicine | Admitting: Emergency Medicine

## 2020-04-07 DIAGNOSIS — I10 Essential (primary) hypertension: Secondary | ICD-10-CM | POA: Insufficient documentation

## 2020-04-07 DIAGNOSIS — Y939 Activity, unspecified: Secondary | ICD-10-CM | POA: Diagnosis not present

## 2020-04-07 DIAGNOSIS — S0591XA Unspecified injury of right eye and orbit, initial encounter: Secondary | ICD-10-CM | POA: Diagnosis present

## 2020-04-07 DIAGNOSIS — Z91040 Latex allergy status: Secondary | ICD-10-CM | POA: Insufficient documentation

## 2020-04-07 DIAGNOSIS — S0501XA Injury of conjunctiva and corneal abrasion without foreign body, right eye, initial encounter: Secondary | ICD-10-CM

## 2020-04-07 DIAGNOSIS — E119 Type 2 diabetes mellitus without complications: Secondary | ICD-10-CM | POA: Diagnosis not present

## 2020-04-07 DIAGNOSIS — W228XXA Striking against or struck by other objects, initial encounter: Secondary | ICD-10-CM | POA: Diagnosis not present

## 2020-04-07 DIAGNOSIS — Y999 Unspecified external cause status: Secondary | ICD-10-CM | POA: Insufficient documentation

## 2020-04-07 DIAGNOSIS — Y929 Unspecified place or not applicable: Secondary | ICD-10-CM | POA: Insufficient documentation

## 2020-04-07 DIAGNOSIS — Z79899 Other long term (current) drug therapy: Secondary | ICD-10-CM | POA: Diagnosis not present

## 2020-04-07 DIAGNOSIS — Z7984 Long term (current) use of oral hypoglycemic drugs: Secondary | ICD-10-CM | POA: Insufficient documentation

## 2020-04-07 MED ORDER — FLUORESCEIN SODIUM 1 MG OP STRP
1.0000 | ORAL_STRIP | Freq: Once | OPHTHALMIC | Status: DC
  Filled 2020-04-07: qty 1

## 2020-04-07 MED ORDER — POLYMYXIN B-TRIMETHOPRIM 10000-0.1 UNIT/ML-% OP SOLN: 2.0000 [drp] | OPHTHALMIC | 0 refills | Status: DC

## 2020-04-07 NOTE — ED Provider Notes (Signed)
[facility] Emergency Department Provider Note  ____________________________________________  Time seen: Approximately 8:26 AM  I have reviewed the triage vital signs and the nursing notes.   HISTORY  Chief Complaint Eye Problem    HPI Sara [name] is a 44 y.o. female that presents to the emergency department for evaluation of right eye pain and drainage after hitting herself in the eye with a label yesterday.  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  Eye has been painful, swelling, draining clear drainage since.  No additional injuries.   Past Medical History:  Diagnosis Date  . Diabetes mellitus without complication (HCC)   . Hypertension   . Seizures [facility])     Patient Active Problem List   Diagnosis Date Noted  . Seizure (HCC) 03/12/2018  . Low back pain 05/22/2015  . Type 1 diabetes mellitus (HCC) 04/04/2015  . Epilepsy (HCC) 04/04/2015  . Muscle spasm of calf 04/04/2015  . Ehlers-Danlos syndrome type III 04/04/2015  . Lower leg pain 04/04/2015  . Migraine 04/04/2015    Past Surgical History:  Procedure Laterality Date  . CARPAL TUNNEL RELEASE    . CERVICAL FUSION    . CESAREAN SECTION  x3  . HIP ARTHROSCOPY    . PARTIAL HYSTERECTOMY      Prior to Admission medications   Medication Sig Start Date End Date Taking? Authorizing Provider  albuterol (PROVENTIL HFA;VENTOLIN HFA) 108 (90 Base) MCG/ACT inhaler Inhale 2 to 3 puffs into the lungs every 6 hours as needed (respiratory problems)    [provider]  ALPRAZolam (XANAX) 0.5 MG tablet Take 0.5 mg by mouth daily as needed for anxiety or sleep.    [provider]  augmented betamethasone dipropionate (DIPROLENE-AF) 0.05 % ointment Apply 1 application topically daily as needed (for itchy and/or raised skin spots).    [provider]  Benzoyl Peroxide (BENZOYL PEROXIDE) 10 % LIQD Apply 1  application topically daily.    [provider]  ciclopirox (PENLAC) 8 % solution Apply topically at bedtime. Apply over nail and surrounding skin. Apply daily over previous coat. After seven (7) days, may remove with alcohol and continue cycle.    [provider]  clindamycin (CLEOCIN T) 1 % external solution Apply 1 application topically 2 (two) times daily. (face, chest, back and shoulders)    [provider]  cyclobenzaprine (FLEXERIL) 10 MG tablet Take 10 mg by mouth 3 (three) times daily as needed for muscle spasms.    [provider]  Diclofenac Sodium 1.5 % SOLN Place 40 drops onto the skin 4 (four) times daily as needed (pain/inflammation).    [provider]  dicyclomine (BENTYL) 10 MG capsule Take 10 mg by mouth 3 (three) times daily as needed for spasms.    [provider]  DULoxetine (CYMBALTA) 60 MG capsule Take 60 mg by mouth daily.    [provider]  EMGALITY 120 MG/ML SOAJ Inject 120 mg into the skin every 28 (twenty-eight) days.  03/05/18   [provider]  ergocalciferol (VITAMIN D2) 50000 units capsule Take 50,000 Units by mouth 2 (two) times a week.    [provider]  ferrous sulfate 325 (65 FE) MG tablet Take 325 mg by mouth daily with breakfast.    [provider]  glucagon (GLUCAGEN) 1 MG SOLR injection Inject 1 mg into the vein once as needed for low blood sugar.  [provider]  hydroquinone 4 % cream Apply 1 application topically daily. (up to 90 days)    [provider]  insulin aspart (NOVOLOG) 100 UNIT/ML injection For insulin pump - administration average 160u over course of day    [provider]  insulin detemir (LEVEMIR) 100 UNIT/ML injection Inject 40 Units into the skin 2 (two) times daily. (USE ONLY IF INSULIN PUMP FAILS)    [provider]  Lacosamide (VIMPAT) 150 MG TABS Take 1 tablet (150 mg total) by mouth 2 (two) times daily. 03/13/18  04/12/18  Ihor AustinPyreddy, Pavan, MD  lamoTRIgine (LAMICTAL) 100 MG tablet Take 1 tablet (100 mg total) by mouth 2 (two) times daily. 03/13/18 04/12/18  Ihor AustinPyreddy, Pavan, MD  magnesium oxide (MAG-OX) 400 MG tablet Take 400 mg by mouth daily.    [provider]  metaxalone (SKELAXIN) 800 MG tablet Take 800 mg by mouth 3 (three) times daily as needed for muscle spasms.    [provider]  metFORMIN (GLUCOPHAGE-XR) 500 MG 24 hr tablet Take 500 mg by mouth 2 (two) times daily. 03/05/18   [provider]  methocarbamol (ROBAXIN) 750 MG tablet Take 750 mg by mouth 3 (three) times daily as needed for muscle spasms.    [provider]  montelukast (SINGULAIR) 10 MG tablet Take 10 mg by mouth at bedtime.    [provider]  potassium chloride (K-DUR,KLOR-CON) 10 MEQ tablet Take 10 mEq by mouth daily.    [provider]  rizatriptan (MAXALT) 10 MG tablet Take 10 mg by mouth as needed for migraine. May repeat in 2 hours if needed    [provider]  rosuvastatin (CRESTOR) 20 MG tablet Take 20 mg by mouth daily.    [provider]  topiramate (TOPAMAX) 100 MG tablet Take 1 tablet (100 mg total) by mouth 4 (four) times daily. 03/13/18 04/12/18  Ihor AustinPyreddy, Pavan, MD  triamcinolone ointment (KENALOG) 0.1 % Apply to skin lesions on the legs twice daily as directed    [provider]  trimethoprim-polymyxin b (POLYTRIM) ophthalmic solution Place 2 drops into the right eye every 4 (four) hours. 04/07/20   Enid DerryWagner, Alba Perillo, PA-C  valACYclovir (VALTREX) 1000 MG tablet Take 1,000 mg by mouth daily. 12/16/17   [provider]  vitamin B-12 (CYANOCOBALAMIN) 1000 MCG tablet Take 1,000 mcg by mouth daily.    [provider]    Allergies Latex and Tape  Family History  Problem Relation Age of Onset  . Hypertension Other     Social History Social History   Tobacco Use  . Smoking status: Never Smoker  . Smokeless tobacco: Never Used   Vaping Use  . Vaping Use: Never used  Substance Use Topics  . Alcohol use: Yes    Alcohol/week: 0.0 standard drinks  . Drug use: Never     Review of Systems  Cardiovascular: No chest pain. Respiratory: No SOB. Gastrointestinal:  No nausea, no vomiting.  Musculoskeletal: Negative for musculoskeletal pain. Skin: Negative for rash, abrasions, lacerations, ecchymosis. Neurological: Negative for headaches   ____________________________________________   PHYSICAL EXAM:  VITAL SIGNS: ED Triage Vitals  Enc Vitals Group     BP 04/07/20 0714 (!) 147/94     Pulse Rate 04/07/20 0714 (!) 110     Resp 04/07/20 0714 17     Temp 04/07/20 0714 98 F (36.7 C)     Temp Source 04/07/20 0714 Oral     SpO2 04/07/20 0714 99 %  Weight 04/07/20 0715 140 lb (63.5 kg)     Height 04/07/20 0715 5\' 2"  (1.575 m)     Head Circumference --      Peak Flow --      Pain Score 04/07/20 0725 9     Pain Loc --      Pain Edu? --      Excl. in GC? --      Constitutional: Alert and oriented. Well appearing and in no acute distress. Eyes: Right conjunctiva is mildly injected. PERRL. EOMI. 1/2 cm thin linear defect on fluorescein stain overlying inferior iris.  Mild swelling to upper and lower right eyelids.  Watery discharge. Head: Atraumatic. ENT:      Ears:      Nose: No congestion/rhinnorhea.      Mouth/Throat: Mucous membranes are moist.  Neck: No stridor. Cardiovascular: Normal rate, regular rhythm.  Good peripheral circulation. Respiratory: Normal respiratory effort without tachypnea or retractions. Lungs CTAB. Good air entry to the bases with no decreased or absent breath sounds. Gastrointestinal: Bowel sounds 4 quadrants. Soft and nontender to palpation. No guarding or rigidity. No palpable masses. No distention.  Musculoskeletal: Full range of motion to all extremities. No gross deformities appreciated. Neurologic:  Normal speech and language. No gross focal neurologic deficits are  appreciated.  Skin:  Skin is warm, dry and intact. No rash noted. Psychiatric: Mood and affect are normal. Speech and behavior are normal. Patient exhibits appropriate insight and judgement.   ____________________________________________   LABS (all labs ordered are listed, but only abnormal results are displayed)  Labs Reviewed - No data to display ____________________________________________  EKG   ____________________________________________  RADIOLOGY   No results found.  ____________________________________________    PROCEDURES  Procedure(s) performed:    Procedures    Medications  fluorescein ophthalmic strip 1 strip (has no administration in time range)     ____________________________________________   INITIAL IMPRESSION / ASSESSMENT AND PLAN / ED COURSE  Pertinent labs & imaging results that were available during my care of the patient were reviewed by me and considered in my medical decision making (see chart for details).  Review of the [place] CSRS was performed in accordance of the [facility] prior to dispensing any controlled drugs.   Patient's diagnosis is consistent with corneal abrasion.  Exam is consistent with a corneal abrasion.  Patient will be discharged home with prescriptions for Polytrim. Patient is to follow up with ophthalmology as directed. Patient is given ED precautions to return to the ED for any worsening or new symptoms.  Sara [name] was evaluated in Emergency Department on 04/07/2020 for the symptoms described in the history of present illness. She was evaluated in the context of the global COVID-19 pandemic, which necessitated consideration that the patient might be at risk for infection with the SARS-CoV-2 virus that causes COVID-19. Institutional protocols and algorithms that pertain to the evaluation of patients at risk for COVID-19 are in a state of rapid change based on information released by regulatory bodies including  the CDC and federal and state organizations. These policies and algorithms were followed during the patient's care in the ED.   ____________________________________________  FINAL CLINICAL IMPRESSION(S) / ED DIAGNOSES  Final diagnoses:  Abrasion of right cornea, initial encounter      NEW MEDICATIONS STARTED DURING THIS VISIT:  ED Discharge Orders         Ordered    trimethoprim-polymyxin b (POLYTRIM) ophthalmic solution  Every 4 hours,   Status:  Discontinued  04/07/20 0910    trimethoprim-polymyxin b (POLYTRIM) ophthalmic solution  Every 4 hours        04/07/20 0931              This chart was dictated using voice recognition software/Dragon. Despite best efforts to proofread, errors can occur which can change the meaning. Any change was purely unintentional.    Enid Derry, PA-C 04/07/20 1158    Jene Every, MD 04/07/20 [phone]

## 2020-04-07 NOTE — ED Notes (Signed)
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

## 2020-04-07 NOTE — ED Triage Notes (Signed)
Pt presents c/o right eye pain and swelling after hitting self in eye yesterday. + swelling. + blurred vision.

## 2020-05-27 ENCOUNTER — Encounter: Payer: Self-pay | Admitting: Emergency Medicine

## 2020-05-27 ENCOUNTER — Other Ambulatory Visit: Payer: Self-pay

## 2020-05-27 ENCOUNTER — Emergency Department
Admission: EM | Admit: 2020-05-27 | Discharge: 2020-05-27 | Disposition: A | Discharge status: Home / Self Care | Payer: Medicare Other | Attending: Emergency Medicine | Admitting: Emergency Medicine

## 2020-05-27 DIAGNOSIS — Z91040 Latex allergy status: Secondary | ICD-10-CM | POA: Diagnosis not present

## 2020-05-27 DIAGNOSIS — Z794 Long term (current) use of insulin: Secondary | ICD-10-CM | POA: Insufficient documentation

## 2020-05-27 DIAGNOSIS — E109 Type 1 diabetes mellitus without complications: Secondary | ICD-10-CM | POA: Diagnosis not present

## 2020-05-27 DIAGNOSIS — X58XXXA Exposure to other specified factors, initial encounter: Secondary | ICD-10-CM | POA: Diagnosis not present

## 2020-05-27 DIAGNOSIS — L02811 Cutaneous abscess of head [any part, except face]: Secondary | ICD-10-CM | POA: Diagnosis not present

## 2020-05-27 DIAGNOSIS — Z7984 Long term (current) use of oral hypoglycemic drugs: Secondary | ICD-10-CM | POA: Insufficient documentation

## 2020-05-27 DIAGNOSIS — I10 Essential (primary) hypertension: Secondary | ICD-10-CM | POA: Diagnosis not present

## 2020-05-27 DIAGNOSIS — S0101XA Laceration without foreign body of scalp, initial encounter: Secondary | ICD-10-CM | POA: Diagnosis present

## 2020-05-27 DIAGNOSIS — L0291 Cutaneous abscess, unspecified: Secondary | ICD-10-CM

## 2020-05-27 MED ORDER — CEPHALEXIN 500 MG PO CAPS: 500.0000 mg | ORAL_CAPSULE | Freq: Three times a day (TID) | ORAL | 0 refills | Status: AC

## 2020-05-27 MED ORDER — DOXYCYCLINE HYCLATE 100 MG PO CAPS: 100.0000 mg | ORAL_CAPSULE | Freq: Two times a day (BID) | ORAL | 0 refills | Status: DC

## 2020-05-27 MED ORDER — CEPHALEXIN 500 MG PO CAPS: 500.0000 mg | ORAL_CAPSULE | Freq: Three times a day (TID) | ORAL | 0 refills | Status: DC

## 2020-05-27 MED ORDER — DOXYCYCLINE HYCLATE 100 MG PO CAPS: 100.0000 mg | ORAL_CAPSULE | Freq: Two times a day (BID) | ORAL | 0 refills | Status: AC

## 2020-05-27 NOTE — ED Notes (Signed)
See triage note- draining abscess on R forehead x3 days.

## 2020-05-27 NOTE — ED Triage Notes (Signed)
Pt presents to ED via POV with c/o abscess to side of head that is draining. Pt states today noted another abscess to her jaw line.

## 2020-05-27 NOTE — ED Provider Notes (Signed)
Emergency Department Provider Note  ____________________________________________  Time seen: Approximately 6:44 PM  I have reviewed the triage vital signs and the nursing notes.   HISTORY  Chief Complaint Abscess   Historian Patient     HPI Sara [name] is a 44 y.o. female presents to the emergency department with a 3 cm x 2 cm right-sided scalp laceration.  Patient states that she has had symptoms for the past 3 days.  She has had some low-grade fever at home and has attempted drainage at home without success.  She denies experiencing similar abscesses in the past.  No other alleviating measures have been attempted.    Past Medical History:  Diagnosis Date  . Diabetes mellitus without complication (HCC)   . Hypertension   . Seizures (HCC)      Immunizations up to date:  Yes.     Past Medical History:  Diagnosis Date  . Diabetes mellitus without complication (HCC)   . Hypertension   . Seizures [facility])     Patient Active Problem List   Diagnosis Date Noted  . Seizure (HCC) 03/12/2018  . Low back pain 05/22/2015  . Type 1 diabetes mellitus (HCC) 04/04/2015  . Epilepsy (HCC) 04/04/2015  . Muscle spasm of calf 04/04/2015  . Ehlers-Danlos syndrome type III 04/04/2015  . Lower leg pain 04/04/2015  . Migraine 04/04/2015    Past Surgical History:  Procedure Laterality Date  . CARPAL TUNNEL RELEASE    . CERVICAL FUSION    . CESAREAN SECTION  x3  . HIP ARTHROSCOPY    . PARTIAL HYSTERECTOMY      Prior to Admission medications   Medication Sig Start Date End Date Taking? Authorizing Provider  albuterol (PROVENTIL HFA;VENTOLIN HFA) 108 (90 Base) MCG/ACT inhaler Inhale 2 to 3 puffs into the lungs every 6 hours as needed (respiratory problems)    [provider]  ALPRAZolam (XANAX) 0.5 MG tablet Take 0.5 mg by mouth daily as needed for anxiety or sleep.    [provider]  augmented betamethasone dipropionate (DIPROLENE-AF) 0.05 %  ointment Apply 1 application topically daily as needed (for itchy and/or raised skin spots).    [provider]  Benzoyl Peroxide (BENZOYL PEROXIDE) 10 % LIQD Apply 1 application topically daily.    [provider]  cephALEXin (KEFLEX) 500 MG capsule Take 1 capsule (500 mg total) by mouth 3 (three) times daily for 7 days. 05/27/20 06/03/20  Orvil Feil, PA-C  ciclopirox (PENLAC) 8 % solution Apply topically at bedtime. Apply over nail and surrounding skin. Apply daily over previous coat. After seven (7) days, may remove with alcohol and continue cycle.    [provider]  clindamycin (CLEOCIN T) 1 % external solution Apply 1 application topically 2 (two) times daily. (face, chest, back and shoulders)    [provider]  cyclobenzaprine (FLEXERIL) 10 MG tablet Take 10 mg by mouth 3 (three) times daily as needed for muscle spasms.    [provider]  Diclofenac Sodium 1.5 % SOLN Place 40 drops onto the skin 4 (four) times daily as needed (pain/inflammation).    [provider]  dicyclomine (BENTYL) 10 MG capsule Take 10 mg by mouth 3 (three) times daily as needed for spasms.    [provider]  doxycycline (VIBRAMYCIN) 100 MG capsule Take 1 capsule (100 mg total) by mouth 2 (two) times daily for 7 days. 05/27/20 06/03/20  Orvil Feil, PA-C  DULoxetine (CYMBALTA) 60 MG capsule Take 60 mg by  mouth daily.    [provider]  EMGALITY 120 MG/ML SOAJ Inject 120 mg into the skin every 28 (twenty-eight) days.  03/05/18   [provider]  ergocalciferol (VITAMIN D2) 50000 units capsule Take 50,000 Units by mouth 2 (two) times a week.    [provider]  ferrous sulfate 325 (65 FE) MG tablet Take 325 mg by mouth daily with breakfast.    [provider]  glucagon (GLUCAGEN) 1 MG SOLR injection Inject 1 mg into the vein once as needed for low blood sugar.    [provider]  hydroquinone 4 % cream  Apply 1 application topically daily. (up to 90 days)    [provider]  insulin aspart (NOVOLOG) 100 UNIT/ML injection For insulin pump - administration average 160u over course of day    [provider]  insulin detemir (LEVEMIR) 100 UNIT/ML injection Inject 40 Units into the skin 2 (two) times daily. (USE ONLY IF INSULIN PUMP FAILS)    [provider]  Lacosamide (VIMPAT) 150 MG TABS Take 1 tablet (150 mg total) by mouth 2 (two) times daily. 03/13/18 04/12/18  Ihor Austin, MD  lamoTRIgine (LAMICTAL) 100 MG tablet Take 1 tablet (100 mg total) by mouth 2 (two) times daily. 03/13/18 04/12/18  Ihor Austin, MD  magnesium oxide (MAG-OX) 400 MG tablet Take 400 mg by mouth daily.    [provider]  metaxalone (SKELAXIN) 800 MG tablet Take 800 mg by mouth 3 (three) times daily as needed for muscle spasms.    [provider]  metFORMIN (GLUCOPHAGE-XR) 500 MG 24 hr tablet Take 500 mg by mouth 2 (two) times daily. 03/05/18   [provider]  methocarbamol (ROBAXIN) 750 MG tablet Take 750 mg by mouth 3 (three) times daily as needed for muscle spasms.    [provider]  montelukast (SINGULAIR) 10 MG tablet Take 10 mg by mouth at bedtime.    [provider]  potassium chloride (K-DUR,KLOR-CON) 10 MEQ tablet Take 10 mEq by mouth daily.    [provider]  rizatriptan (MAXALT) 10 MG tablet Take 10 mg by mouth as needed for migraine. May repeat in 2 hours if needed    [provider]  rosuvastatin (CRESTOR) 20 MG tablet Take 20 mg by mouth daily.    [provider]  topiramate (TOPAMAX) 100 MG tablet Take 1 tablet (100 mg total) by mouth 4 (four) times daily. 03/13/18 04/12/18  Ihor Austin, MD  triamcinolone ointment (KENALOG) 0.1 % Apply to skin lesions on the legs twice daily as directed    [provider]  trimethoprim-polymyxin b (POLYTRIM) ophthalmic solution Place 2 drops into the right eye every 4  (four) hours. 04/07/20   Enid Derry, PA-C  valACYclovir (VALTREX) 1000 MG tablet Take 1,000 mg by mouth daily. 12/16/17   [provider]  vitamin B-12 (CYANOCOBALAMIN) 1000 MCG tablet Take 1,000 mcg by mouth daily.    [provider]    Allergies Latex and Tape  Family History  Problem Relation Age of Onset  . Hypertension Other     Social History Social History   Tobacco Use  . Smoking status: Never Smoker  . Smokeless tobacco: Never Used  Vaping Use  . Vaping Use: Never used  Substance Use Topics  . Alcohol use: Yes    Alcohol/week: 0.0 standard drinks  . Drug use: Never     Review of Systems  Constitutional: No fever/chills Eyes:  No discharge ENT: No  upper respiratory complaints. Respiratory: no cough. No SOB/ use of accessory muscles to breath Gastrointestinal:   No nausea, no vomiting.  No diarrhea.  No constipation. Musculoskeletal: Negative for musculoskeletal pain. Skin: Patient has scalp abscess.     ____________________________________________   PHYSICAL EXAM:  VITAL SIGNS: ED Triage Vitals [05/27/20 1725]  Enc Vitals Group     BP 119/86     Pulse Rate 100     Resp 20     Temp 99.6 F (37.6 C)     Temp Source Oral     SpO2 100 %     Weight 145 lb (65.8 kg)     Height 5' 1.5" (1.562 m)     Head Circumference      Peak Flow      Pain Score 8     Pain Loc      Pain Edu?      Excl. in GC?      Constitutional: Alert and oriented. Well appearing and in no acute distress. Eyes: Conjunctivae are normal. PERRL. EOMI. Head: Atraumatic. ENT:      Ears: TMs are pearly.       Nose: No congestion/rhinnorhea.      Mouth/Throat: Mucous membranes are moist.  Neck: No stridor.  No cervical spine tenderness to palpation. FROM.  Cardiovascular: Normal rate, regular rhythm. Normal S1 and S2.  Good peripheral circulation. Respiratory: Normal respiratory effort without tachypnea or retractions. Lungs CTAB. Good air entry to the bases  with no decreased or absent breath sounds Gastrointestinal: Bowel sounds x 4 quadrants. Soft and nontender to palpation. No guarding or rigidity. No distention. Musculoskeletal: Full range of motion to all extremities. No obvious deformities noted Neurologic:  Normal for age. No gross focal neurologic deficits are appreciated.  Skin: Patient has a 3 cm x 2 cm right sided parietal scalp laceration with surrounding erythema and palpable fluctuance. Psychiatric: Mood and affect are normal for age. Speech and behavior are normal.   ____________________________________________   LABS (all labs ordered are listed, but only abnormal results are displayed)  Labs Reviewed - No data to display ____________________________________________  EKG   ____________________________________________  RADIOLOGY   No results found.  ____________________________________________    PROCEDURES  Procedure(s) performed:     Marland KitchenMarland KitchenIncision and Drainage  Date/Time: 05/27/2020 6:49 PM Performed by: Orvil Feil, PA-C Authorized by: Orvil Feil, PA-C   Consent:    Consent obtained:  Verbal   Consent given by:  Patient   Risks discussed:  Bleeding, incomplete drainage, pain and damage to other organs   Alternatives discussed:  No treatment Universal protocol:    Procedure explained and questions answered to patient or proxy's satisfaction: yes     Relevant documents present and verified: yes     Test results available and properly labeled: yes     Imaging studies available: yes     Required blood products, implants, devices, and special equipment available: yes     Site/side marked: yes     Immediately prior to procedure a time out was called: yes     Patient identity confirmed:  Verbally with patient Location:    Type:  Abscess Pre-procedure details:    Skin preparation:  Betadine Anesthesia (see MAR for exact dosages):    Anesthesia method:  Local infiltration   Local anesthetic:   Lidocaine 1% WITH epi Procedure type:    Complexity:  Simple Procedure details:    Incision types:  Single straight   Incision depth:  Subcutaneous   Scalpel blade:  11   Wound management:  Probed and deloculated, irrigated with saline and extensive cleaning   Drainage:  Purulent   Drainage amount:  Moderate   Packing materials:  None Post-procedure details:    Patient tolerance of procedure:  Tolerated well, no immediate complications       Medications - No data to display   ____________________________________________   INITIAL IMPRESSION / ASSESSMENT AND PLAN / ED COURSE  Pertinent labs & imaging results that were available during my care of the patient were reviewed by me and considered in my medical decision making (see chart for details).      Assessment and Plan: Scalp abscess:  44 year old female presents to the emergency department with a 3 cm x 2 cm right-sided parietal scalp abscess.  Physical exam, patient had surrounding erythema and palpable fluctuance.  She underwent incision and drainage without complication.  Patient was discharged with doxycycline and Keflex.  Return precautions were given to return with new or worsening symptoms.  All patient questions were answered.   ____________________________________________  FINAL CLINICAL IMPRESSION(S) / ED DIAGNOSES  Final diagnoses:  Abscess      NEW MEDICATIONS STARTED DURING THIS VISIT:  ED Discharge Orders         Ordered    doxycycline (VIBRAMYCIN) 100 MG capsule  2 times daily        05/27/20 1842    cephALEXin (KEFLEX) 500 MG capsule  3 times daily        05/27/20 1842              This chart was dictated using voice recognition software/Dragon. Despite best efforts to proofread, errors can occur which can change the meaning. Any change was purely unintentional.     Orvil FeilWoods, Itza Maniaci M, PA-C 05/27/20 1851    Phineas SemenGoodman, [place], MD 05/27/20 2013

## 2020-05-27 NOTE — Discharge Instructions (Addendum)
Take Doxycycline twice daily for the next seven days. Take Keflex three times daily for the next seven days.

## 2020-05-28 ENCOUNTER — Emergency Department
Admission: EM | Admit: 2020-05-28 | Discharge: 2020-05-28 | Disposition: A | Discharge status: Home / Self Care | Payer: Medicare Other | Attending: Emergency Medicine | Admitting: Emergency Medicine

## 2020-05-28 ENCOUNTER — Other Ambulatory Visit: Payer: Self-pay

## 2020-05-28 DIAGNOSIS — R519 Headache, unspecified: Secondary | ICD-10-CM | POA: Insufficient documentation

## 2020-05-28 DIAGNOSIS — Z5321 Procedure and treatment not carried out due to patient leaving prior to being seen by health care provider: Secondary | ICD-10-CM | POA: Diagnosis not present

## 2020-05-28 DIAGNOSIS — R220 Localized swelling, mass and lump, head: Secondary | ICD-10-CM | POA: Diagnosis not present

## 2020-05-28 DIAGNOSIS — L02811 Cutaneous abscess of head [any part, except face]: Secondary | ICD-10-CM | POA: Insufficient documentation

## 2020-05-28 NOTE — ED Triage Notes (Signed)
Pt comes via POV from home with c/o right eye swelling. Pt states she was here yesterday for abscess and had it drained. Pt states she was prescribed Doxycycline and Cephalexin and took first dose yesterday and another one this am.  Pt states this morning when she woke up she noticed the eye swelling and face pain.

## 2020-06-12 ENCOUNTER — Encounter: Payer: Self-pay | Admitting: Ophthalmology

## 2020-06-12 ENCOUNTER — Other Ambulatory Visit: Payer: Self-pay

## 2020-06-14 ENCOUNTER — Other Ambulatory Visit
Admission: RE | Admit: 2020-06-14 | Discharge: 2020-06-14 | Disposition: A | Discharge status: Home / Self Care | Payer: Medicare Other | Source: Ambulatory Visit | Attending: Ophthalmology | Admitting: Ophthalmology

## 2020-06-14 ENCOUNTER — Other Ambulatory Visit: Payer: Self-pay

## 2020-06-14 DIAGNOSIS — Z01812 Encounter for preprocedural laboratory examination: Secondary | ICD-10-CM | POA: Insufficient documentation

## 2020-06-14 DIAGNOSIS — Z20822 Contact with and (suspected) exposure to covid-19: Secondary | ICD-10-CM | POA: Diagnosis not present

## 2020-06-15 LAB — SARS CORONAVIRUS 2 (TAT 6-24 HRS): SARS Coronavirus 2: NEGATIVE

## 2020-06-17 NOTE — Discharge Instructions (Signed)

## 2020-06-18 ENCOUNTER — Encounter
Admission: RE | Disposition: A | Discharge status: Home / Self Care | Payer: Self-pay | Source: Home / Self Care | Attending: Ophthalmology

## 2020-06-18 ENCOUNTER — Other Ambulatory Visit: Payer: Self-pay

## 2020-06-18 ENCOUNTER — Ambulatory Visit: Payer: Medicare Other | Admitting: Anesthesiology

## 2020-06-18 ENCOUNTER — Encounter: Payer: Self-pay | Admitting: Ophthalmology

## 2020-06-18 ENCOUNTER — Ambulatory Visit
Admission: RE | Admit: 2020-06-18 | Discharge: 2020-06-18 | Disposition: A | Discharge status: Home / Self Care | Payer: Medicare Other | Attending: Ophthalmology | Admitting: Ophthalmology

## 2020-06-18 DIAGNOSIS — H2511 Age-related nuclear cataract, right eye: Secondary | ICD-10-CM | POA: Insufficient documentation

## 2020-06-18 DIAGNOSIS — F17210 Nicotine dependence, cigarettes, uncomplicated: Secondary | ICD-10-CM | POA: Insufficient documentation

## 2020-06-18 DIAGNOSIS — E1136 Type 2 diabetes mellitus with diabetic cataract: Secondary | ICD-10-CM | POA: Diagnosis not present

## 2020-06-18 HISTORY — DX: Other specified health status: Z78.9

## 2020-06-18 HISTORY — DX: Epilepsy, unspecified, not intractable, without status epilepticus: G40.909

## 2020-06-18 HISTORY — DX: Encounter for adoption services: Z02.82

## 2020-06-18 HISTORY — DX: Ehlers-Danlos syndrome, unspecified: Q79.60

## 2020-06-18 HISTORY — DX: Other complications of anesthesia, initial encounter: T88.59XA

## 2020-06-18 HISTORY — DX: Unspecified osteoarthritis, unspecified site: M19.90

## 2020-06-18 HISTORY — DX: Migraine, unspecified, not intractable, without status migrainosus: G43.909

## 2020-06-18 HISTORY — DX: Unspecified asthma, uncomplicated: J45.909

## 2020-06-18 HISTORY — PX: CATARACT EXTRACTION W/PHACO: SHX586

## 2020-06-18 LAB — GLUCOSE, CAPILLARY
Glucose-Capillary: 130 mg/dL — ABNORMAL HIGH (ref 70–99)
Glucose-Capillary: 142 mg/dL — ABNORMAL HIGH (ref 70–99)

## 2020-06-18 SURGERY — PHACOEMULSIFICATION, CATARACT, WITH IOL INSERTION
Anesthesia: Monitor Anesthesia Care | Site: Eye | Laterality: Right

## 2020-06-18 MED ORDER — MOXIFLOXACIN HCL 0.5 % OP SOLN
OPHTHALMIC | Status: DC | PRN
  Administered 2020-06-18: 0.2 mL via OPHTHALMIC

## 2020-06-18 MED ORDER — PHENYLEPHRINE HCL 10 % OP SOLN
1.0000 [drp] | OPHTHALMIC | Status: DC | PRN
  Administered 2020-06-18 (×2): 1 [drp] via OPHTHALMIC

## 2020-06-18 MED ORDER — NA CHONDROIT SULF-NA HYALURON 40-17 MG/ML IO SOLN
INTRAOCULAR | Status: DC | PRN
  Administered 2020-06-18: 1 mL via INTRAOCULAR

## 2020-06-18 MED ORDER — ARMC OPHTHALMIC DILATING DROPS
1.0000 "application " | OPHTHALMIC | Status: DC | PRN
  Administered 2020-06-18 (×3): 1 via OPHTHALMIC

## 2020-06-18 MED ORDER — EPINEPHRINE PF 1 MG/ML IJ SOLN
INTRAOCULAR | Status: DC | PRN
  Administered 2020-06-18: 99 mL via OPHTHALMIC

## 2020-06-18 MED ORDER — LACTATED RINGERS IV SOLN: INTRAVENOUS | Status: DC

## 2020-06-18 MED ORDER — LIDOCAINE HCL (PF) 2 % IJ SOLN
INTRAOCULAR | Status: DC | PRN
  Administered 2020-06-18: 1 mL

## 2020-06-18 MED ORDER — BRIMONIDINE TARTRATE-TIMOLOL 0.2-0.5 % OP SOLN
OPHTHALMIC | Status: DC | PRN
  Administered 2020-06-18: 1 [drp] via OPHTHALMIC

## 2020-06-18 MED ORDER — TETRACAINE HCL 0.5 % OP SOLN
1.0000 [drp] | OPHTHALMIC | Status: DC | PRN
  Administered 2020-06-18 (×3): 1 [drp] via OPHTHALMIC

## 2020-06-18 MED ORDER — MIDAZOLAM HCL 2 MG/2ML IJ SOLN
INTRAMUSCULAR | Status: DC | PRN
  Administered 2020-06-18 (×2): 1 mg via INTRAVENOUS

## 2020-06-18 MED ORDER — FENTANYL CITRATE (PF) 100 MCG/2ML IJ SOLN
INTRAMUSCULAR | Status: DC | PRN
Start: 2020-06-18 — End: 2020-06-18
  Administered 2020-06-18 (×2): 50 ug via INTRAVENOUS

## 2020-06-18 SURGICAL SUPPLY — 19 items
CANNULA ANT/CHMB 27G (MISCELLANEOUS) ×2 IMPLANT
CANNULA ANT/CHMB 27GA (MISCELLANEOUS) ×6 IMPLANT
GLOVE SURG LX 8.0 MICRO (GLOVE) ×2
GLOVE SURG LX STRL 8.0 MICRO (GLOVE) ×1 IMPLANT
GLOVE SURG TRIUMPH 8.0 PF LTX (GLOVE) ×3 IMPLANT
GOWN STRL REUS W/ TWL LRG LVL3 (GOWN DISPOSABLE) ×2 IMPLANT
GOWN STRL REUS W/TWL LRG LVL3 (GOWN DISPOSABLE) ×6
LENS IOL TECNIS EYHANCE 23.5 (Intraocular Lens) ×2 IMPLANT
MARKER SKIN DUAL TIP RULER LAB (MISCELLANEOUS) ×3 IMPLANT
NDL FILTER BLUNT 18X1 1/2 (NEEDLE) ×1 IMPLANT
NEEDLE FILTER BLUNT 18X 1/2SAF (NEEDLE) ×2
NEEDLE FILTER BLUNT 18X1 1/2 (NEEDLE) ×1 IMPLANT
PACK EYE AFTER SURG (MISCELLANEOUS) ×3 IMPLANT
PACK OPTHALMIC (MISCELLANEOUS) ×3 IMPLANT
PACK PORFILIO (MISCELLANEOUS) ×3 IMPLANT
SYR 3ML LL SCALE MARK (SYRINGE) ×3 IMPLANT
SYR TB 1ML LUER SLIP (SYRINGE) ×3 IMPLANT
WATER STERILE IRR 250ML POUR (IV SOLUTION) ×3 IMPLANT
WIPE NON LINTING 3.25X3.25 (MISCELLANEOUS) ×3 IMPLANT

## 2020-06-18 NOTE — Anesthesia Preprocedure Evaluation (Signed)
Anesthesia Evaluation  Patient identified by MRN, date of birth, ID band Patient awake    Reviewed: Allergy & Precautions, H&P , NPO status , Patient's Chart, lab work & pertinent test results  Airway Mallampati: III  TM Distance: >3 FB Neck ROM: full    Dental no notable dental hx.    Pulmonary asthma , Current Smoker and Patient abstained from smoking.,    Pulmonary exam normal breath sounds clear to auscultation       Cardiovascular hypertension, Normal cardiovascular exam Rhythm:regular Rate:Normal     Neuro/Psych Seizures -,     GI/Hepatic   Endo/Other  diabetes  Renal/GU      Musculoskeletal   Abdominal   Peds  Hematology   Anesthesia Other Findings   Reproductive/Obstetrics                             Anesthesia Physical Anesthesia Plan  ASA: III  Anesthesia Plan: MAC   Post-op Pain Management:    Induction:   PONV Risk Score and Plan: 2 and Treatment may vary due to age or medical condition, TIVA and Midazolam  Airway Management Planned:   Additional Equipment:   Intra-op Plan:   Post-operative Plan:   Informed Consent: I have reviewed the patients History and Physical, chart, labs and discussed the procedure including the risks, benefits and alternatives for the proposed anesthesia with the patient or authorized representative who has indicated his/her understanding and acceptance.     Dental Advisory Given  Plan Discussed with: CRNA  Anesthesia Plan Comments:         Anesthesia Quick Evaluation

## 2020-06-18 NOTE — Anesthesia Postprocedure Evaluation (Signed)
Anesthesia Post Note  Patient: Sara [name]  Procedure(s) Performed: CATARACT EXTRACTION PHACO AND INTRAOCULAR LENS PLACEMENT (IOC) RIGHT DIABETIC 13.23 01:42.7 (Right Eye)     Patient location during evaluation: PACU Anesthesia Type: MAC Level of consciousness: awake and alert and oriented Pain management: satisfactory to patient Vital Signs Assessment: post-procedure vital signs reviewed and stable Respiratory status: spontaneous breathing, nonlabored ventilation and respiratory function stable Cardiovascular status: blood pressure returned to baseline and stable Postop Assessment: Adequate PO intake and No signs of nausea or vomiting Anesthetic complications: no   No complications documented.  Raliegh Ip

## 2020-06-18 NOTE — H&P (Signed)
[facility] [place]   Primary Care Physician:  Thomes Dinning, MD Ophthalmologist: Dr. Druscilla Brownie  Pre-Procedure History & Physical: HPI:  Sara [name] is a 44 y.o. female here for cataract surgery.   Past Medical History:  Diagnosis Date  . Adopted   . Arthritis    hips, lower back  . Asthma   . Complication of anesthesia    Was able to feel procedure during neck surgery  . Diabetes mellitus without complication (HCC)   . Ehlers-Danlos disease   . Epilepsy (HCC)   . Hypertension   . Migraine headache    approx 6x yr (now getting injections from neurology)  . Seizures (HCC)    most recent 05/2020    Past Surgical History:  Procedure Laterality Date  . CARPAL TUNNEL RELEASE    . CERVICAL FUSION    . CESAREAN SECTION  x3  . HIP ARTHROSCOPY    . PARTIAL HYSTERECTOMY      Prior to Admission medications   Medication Sig Start Date End Date Taking? Authorizing Provider  albuterol (PROVENTIL HFA;VENTOLIN HFA) 108 (90 Base) MCG/ACT inhaler Inhale 2 to 3 puffs into the lungs every 6 hours as needed (respiratory problems)   Yes [provider]  Cholecalciferol (VITAMIN D3) 50 MCG (2000 UT) CAPS Take by mouth daily.   Yes [provider]  Cyanocobalamin (VITAMIN B-12 IJ) Inject as directed every 30 (thirty) days.   Yes [provider]  FERROUS SULFATE PO Take by mouth daily.   Yes [provider]  gabapentin (NEURONTIN) 100 MG capsule Take 100 mg by mouth 3 (three) times daily.   Yes [provider]  glucagon (GLUCAGEN) 1 MG SOLR injection Inject 1 mg into the vein once as needed for low blood sugar.   Yes [provider]  Insulin Glargine (TOUJEO SOLOSTAR ) Inject 34 Units into the skin at bedtime.   Yes [provider]  insulin lispro (HUMALOG) 100 UNIT/ML injection Inject into the skin 3 (three) times daily before meals. Sliding scale   Yes [provider]  lamoTRIgine (LAMICTAL) 100 MG tablet Take  1 tablet (100 mg total) by mouth 2 (two) times daily. Patient taking differently: Take 250 mg by mouth 2 (two) times daily.  03/13/18 06/12/20 Yes Pyreddy, Vivien Rota, MD  levETIRAcetam (KEPPRA) 750 MG tablet Take 750 mg by mouth 2 (two) times daily.   Yes [provider]  losartan (COZAAR) 25 MG tablet Take 25 mg by mouth daily.   Yes [provider]  montelukast (SINGULAIR) 10 MG tablet Take 10 mg by mouth at bedtime.   Yes [provider]  rosuvastatin (CRESTOR) 20 MG tablet Take 20 mg by mouth daily.   Yes [provider]  senna (SENOKOT) 8.6 MG tablet Take 1 tablet by mouth daily.   Yes [provider]  topiramate (TOPAMAX) 100 MG tablet Take 1 tablet (100 mg total) by mouth 4 (four) times daily. Patient taking differently: Take 100 mg by mouth 2 (two) times daily.  03/13/18 06/12/20 Yes Pyreddy, Vivien Rota, MD  insulin aspart (NOVOLOG) 100 UNIT/ML injection For insulin pump - administration average 160u over course of day Patient not taking: Reported on 06/12/2020    [provider]  insulin detemir (LEVEMIR) 100 UNIT/ML injection Inject 40 Units into the skin 2 (two) times daily. (USE ONLY IF INSULIN PUMP FAILS) Patient not taking: Reported on 06/12/2020    [provider]  oxycodone (OXY-IR) 5 MG capsule Take 5 mg by mouth. Patient  not taking: Reported on 06/12/2020    [provider]    Allergies as of 05/14/2020 - Review Complete 04/07/2020  Allergen Reaction Noted  . Latex  04/03/2015  . Tape  04/03/2015    Family History  Problem Relation Age of Onset  . Hypertension Other     Social History   Socioeconomic History  . Marital status: Single    Spouse name: Not on file  . Number of children: Not on file  . Years of education: Not on file  . Highest education level: Not on file  Occupational History  . Not on file  Tobacco Use  . Smoking status: Current Every Day Smoker    Packs/day: 0.33    Types:  Cigarettes  . Smokeless tobacco: Never Used  . Tobacco comment: "off and on" since age 89  Vaping Use  . Vaping Use: Never used  Substance and Sexual Activity  . Alcohol use: Yes    Alcohol/week: 1.0 standard drink    Types: 1 Glasses of wine per week  . Drug use: Never  . Sexual activity: Yes  Other Topics Concern  . Not on file  Social History Narrative   Lives with parents. Independent.   Social Determinants of Health   Financial Resource Strain:   . Difficulty of Paying Living Expenses: Not on file  Food Insecurity:   . Worried About Programme researcher, broadcasting/film/video in the Last Year: Not on file  . Ran Out of Food in the Last Year: Not on file  Transportation Needs:   . Lack of Transportation (Medical): Not on file  . Lack of Transportation (Non-Medical): Not on file  Physical Activity:   . Days of Exercise per Week: Not on file  . Minutes of Exercise per Session: Not on file  Stress:   . Feeling of Stress : Not on file  Social Connections:   . Frequency of Communication with Friends and Family: Not on file  . Frequency of Social Gatherings with Friends and Family: Not on file  . Attends Religious Services: Not on file  . Active Member of Clubs or Organizations: Not on file  . Attends Banker Meetings: Not on file  . Marital Status: Not on file  Intimate Partner Violence:   . Fear of Current or Ex-Partner: Not on file  . Emotionally Abused: Not on file  . Physically Abused: Not on file  . Sexually Abused: Not on file    Review of Systems: See HPI, otherwise negative ROS  Physical Exam: BP (!) 144/89   Pulse 100   Temp (!) 97.2 F (36.2 C) (Temporal)   Ht 5' 1.5" (1.562 m)   Wt 73 kg   SpO2 100%   BMI 29.93 kg/m  General:   Alert,  pleasant and cooperative in NAD Head:  Normocephalic and atraumatic. Lungs:  Clear to auscultation.    Heart:  Regular rate and rhythm.   Impression/Plan: Sara [name] is here for cataract surgery.  Risks,  benefits, limitations, and alternatives regarding cataract surgery have been reviewed with the patient.  Questions have been answered.  All parties agreeable.   Galen Manila, MD  06/18/2020, 7:20 AM

## 2020-06-18 NOTE — Transfer of Care (Signed)
Immediate Anesthesia Transfer of Care Note  Patient: Sara [name]  Procedure(s) Performed: CATARACT EXTRACTION PHACO AND INTRAOCULAR LENS PLACEMENT (IOC) RIGHT DIABETIC 13.23 01:42.7 (Right Eye)  Patient Location: PACU  Anesthesia Type: MAC  Level of Consciousness: awake, alert  and patient cooperative  Airway and Oxygen Therapy: Patient Spontanous Breathing and Patient connected to supplemental oxygen  Post-op Assessment: Post-op Vital signs reviewed, Patient's Cardiovascular Status Stable, Respiratory Function Stable, Patent Airway and No signs of Nausea or vomiting  Post-op Vital Signs: Reviewed and stable  Complications: No complications documented.

## 2020-06-18 NOTE — Anesthesia Procedure Notes (Signed)
Procedure Name: MAC Date/Time: 06/18/2020 7:45 AM Performed by: Silvana Newness, CRNA Pre-anesthesia Checklist: Patient identified, Emergency Drugs available, Suction available, Patient being monitored and Timeout performed Patient Re-evaluated:Patient Re-evaluated prior to induction Oxygen Delivery Method: Nasal cannula Placement Confirmation: positive ETCO2

## 2020-06-18 NOTE — Op Note (Signed)
PREOPERATIVE DIAGNOSIS:  Nuclear sclerotic cataract of the right eye.   POSTOPERATIVE DIAGNOSIS:  H25.041 Cataract   OPERATIVE PROCEDURE:@   SURGEON:  Galen Manila, MD.   ANESTHESIA:  Anesthesiologist: Ranee Gosselin, MD CRNA: Michaele Offer, CRNA  1.      Managed anesthesia care. 2.      0.47ml of Shugarcaine was instilled in the eye following the paracentesis.   COMPLICATIONS:  None.   TECHNIQUE:   Stop and chop   DESCRIPTION OF PROCEDURE:  The patient was examined and consented in the preoperative holding area where the aforementioned topical anesthesia was applied to the right eye and then brought back to the Operating Room where the right eye was prepped and draped in the usual sterile ophthalmic fashion and a lid speculum was placed. A paracentesis was created with the side port blade and the anterior chamber was filled with viscoelastic. A near clear corneal incision was performed with the steel keratome. A continuous curvilinear capsulorrhexis was performed with a cystotome followed by the capsulorrhexis forceps. Hydrodissection and hydrodelineation were carried out with BSS on a blunt cannula. The lens was removed in a stop and chop  technique and the remaining cortical material was removed with the irrigation-aspiration handpiece. The capsular bag was inflated with viscoelastic and the Technis [id number]  lens was placed in the capsular bag without complication. The remaining viscoelastic was removed from the eye with the irrigation-aspiration handpiece. The wounds were hydrated. The anterior chamber was flushed with BSS and the eye was inflated to physiologic pressure. 0.33ml of Vigamox was placed in the anterior chamber. The wounds were found to be water tight. The eye was dressed with Combigan. The patient was given protective glasses to wear throughout the day and a shield with which to sleep tonight. The patient was also given drops with which to begin a drop regimen today and will  follow-up with me in one day. Implant Name Type Inv. Item Serial No. Manufacturer Lot No. LRB No. Used Action  LENS IOL TECNIS EYHANCE 23.5 - [id number] Intraocular Lens LENS IOL TECNIS EYHANCE 23.5 [id number] JOHNSON   Right 1 Implanted   Procedure(s) with comments: CATARACT EXTRACTION PHACO AND INTRAOCULAR LENS PLACEMENT (IOC) RIGHT DIABETIC 13.23 01:42.7 (Right) - Diabetic - insulin  Electronically signed: Galen Manila 06/18/2020 8:09 AM

## 2020-06-19 ENCOUNTER — Encounter: Payer: Self-pay | Admitting: Ophthalmology

## 2020-07-03 ENCOUNTER — Encounter: Payer: Self-pay | Admitting: Ophthalmology

## 2020-07-03 ENCOUNTER — Other Ambulatory Visit: Payer: Self-pay

## 2020-07-05 ENCOUNTER — Other Ambulatory Visit
Admission: RE | Admit: 2020-07-05 | Discharge: 2020-07-05 | Disposition: A | Discharge status: Home / Self Care | Payer: Medicare Other | Source: Ambulatory Visit | Attending: Ophthalmology | Admitting: Ophthalmology

## 2020-07-05 ENCOUNTER — Other Ambulatory Visit: Payer: Self-pay

## 2020-07-05 DIAGNOSIS — Z01812 Encounter for preprocedural laboratory examination: Secondary | ICD-10-CM | POA: Diagnosis present

## 2020-07-05 DIAGNOSIS — Z20822 Contact with and (suspected) exposure to covid-19: Secondary | ICD-10-CM | POA: Diagnosis not present

## 2020-07-05 NOTE — Discharge Instructions (Signed)

## 2020-07-06 LAB — SARS CORONAVIRUS 2 (TAT 6-24 HRS): SARS Coronavirus 2: NEGATIVE

## 2020-07-09 ENCOUNTER — Ambulatory Visit: Payer: Medicare Other | Admitting: Anesthesiology

## 2020-07-09 ENCOUNTER — Ambulatory Visit
Admission: RE | Admit: 2020-07-09 | Discharge: 2020-07-09 | Disposition: A | Discharge status: Home / Self Care | Payer: Medicare Other | Attending: Ophthalmology | Admitting: Ophthalmology

## 2020-07-09 ENCOUNTER — Encounter
Admission: RE | Disposition: A | Discharge status: Home / Self Care | Payer: Self-pay | Source: Home / Self Care | Attending: Ophthalmology

## 2020-07-09 ENCOUNTER — Other Ambulatory Visit: Payer: Self-pay

## 2020-07-09 ENCOUNTER — Encounter: Payer: Self-pay | Admitting: Ophthalmology

## 2020-07-09 DIAGNOSIS — I10 Essential (primary) hypertension: Secondary | ICD-10-CM | POA: Insufficient documentation

## 2020-07-09 DIAGNOSIS — Z8249 Family history of ischemic heart disease and other diseases of the circulatory system: Secondary | ICD-10-CM | POA: Diagnosis not present

## 2020-07-09 DIAGNOSIS — F17210 Nicotine dependence, cigarettes, uncomplicated: Secondary | ICD-10-CM | POA: Insufficient documentation

## 2020-07-09 DIAGNOSIS — Z9841 Cataract extraction status, right eye: Secondary | ICD-10-CM | POA: Insufficient documentation

## 2020-07-09 DIAGNOSIS — Z794 Long term (current) use of insulin: Secondary | ICD-10-CM | POA: Insufficient documentation

## 2020-07-09 DIAGNOSIS — H2512 Age-related nuclear cataract, left eye: Secondary | ICD-10-CM | POA: Diagnosis present

## 2020-07-09 DIAGNOSIS — G40909 Epilepsy, unspecified, not intractable, without status epilepticus: Secondary | ICD-10-CM | POA: Diagnosis not present

## 2020-07-09 DIAGNOSIS — E1036 Type 1 diabetes mellitus with diabetic cataract: Secondary | ICD-10-CM | POA: Diagnosis not present

## 2020-07-09 DIAGNOSIS — Q796 Ehlers-Danlos syndrome, unspecified: Secondary | ICD-10-CM | POA: Insufficient documentation

## 2020-07-09 DIAGNOSIS — J45909 Unspecified asthma, uncomplicated: Secondary | ICD-10-CM | POA: Diagnosis not present

## 2020-07-09 DIAGNOSIS — Z79899 Other long term (current) drug therapy: Secondary | ICD-10-CM | POA: Diagnosis not present

## 2020-07-09 HISTORY — PX: CATARACT EXTRACTION W/PHACO: SHX586

## 2020-07-09 LAB — GLUCOSE, CAPILLARY
Glucose-Capillary: 156 mg/dL — ABNORMAL HIGH (ref 70–99)
Glucose-Capillary: 61 mg/dL — ABNORMAL LOW (ref 70–99)

## 2020-07-09 SURGERY — PHACOEMULSIFICATION, CATARACT, WITH IOL INSERTION
Anesthesia: Monitor Anesthesia Care | Site: Eye | Laterality: Left

## 2020-07-09 MED ORDER — ARMC OPHTHALMIC DILATING DROPS
1.0000 "application " | OPHTHALMIC | Status: DC | PRN
  Administered 2020-07-09 (×3): 1 via OPHTHALMIC

## 2020-07-09 MED ORDER — BRIMONIDINE TARTRATE-TIMOLOL 0.2-0.5 % OP SOLN
OPHTHALMIC | Status: DC | PRN
  Administered 2020-07-09: 1 [drp] via OPHTHALMIC

## 2020-07-09 MED ORDER — TETRACAINE HCL 0.5 % OP SOLN
1.0000 [drp] | OPHTHALMIC | Status: DC | PRN
  Administered 2020-07-09 (×3): 1 [drp] via OPHTHALMIC

## 2020-07-09 MED ORDER — MOXIFLOXACIN HCL 0.5 % OP SOLN
OPHTHALMIC | Status: DC | PRN
  Administered 2020-07-09: 0.2 mL via OPHTHALMIC

## 2020-07-09 MED ORDER — FENTANYL CITRATE (PF) 100 MCG/2ML IJ SOLN
INTRAMUSCULAR | Status: DC | PRN
  Administered 2020-07-09: 50 ug via INTRAVENOUS

## 2020-07-09 MED ORDER — ONDANSETRON HCL 4 MG/2ML IJ SOLN: 4.0000 mg | Freq: Once | INTRAMUSCULAR | Status: DC | PRN

## 2020-07-09 MED ORDER — EPINEPHRINE PF 1 MG/ML IJ SOLN
INTRAOCULAR | Status: DC | PRN
  Administered 2020-07-09: 86 mL via OPHTHALMIC

## 2020-07-09 MED ORDER — NA CHONDROIT SULF-NA HYALURON 40-17 MG/ML IO SOLN
INTRAOCULAR | Status: DC | PRN
  Administered 2020-07-09: 1 mL via INTRAOCULAR

## 2020-07-09 MED ORDER — MIDAZOLAM HCL 2 MG/2ML IJ SOLN
INTRAMUSCULAR | Status: DC | PRN
  Administered 2020-07-09: 2 mg via INTRAVENOUS

## 2020-07-09 MED ORDER — LACTATED RINGERS IV SOLN: INTRAVENOUS | Status: DC

## 2020-07-09 MED ORDER — LIDOCAINE HCL (PF) 2 % IJ SOLN
INTRAOCULAR | Status: DC | PRN
  Administered 2020-07-09: 2 mL

## 2020-07-09 MED ORDER — ACETAMINOPHEN 10 MG/ML IV SOLN: 1000.0000 mg | Freq: Once | INTRAVENOUS | Status: DC | PRN

## 2020-07-09 SURGICAL SUPPLY — 19 items
CANNULA ANT/CHMB 27G (MISCELLANEOUS) ×2 IMPLANT
CANNULA ANT/CHMB 27GA (MISCELLANEOUS) ×6 IMPLANT
GLOVE SURG LX 8.0 MICRO (GLOVE) ×2
GLOVE SURG LX STRL 8.0 MICRO (GLOVE) ×1 IMPLANT
GLOVE SURG TRIUMPH 8.0 PF LTX (GLOVE) ×3 IMPLANT
GOWN STRL REUS W/ TWL LRG LVL3 (GOWN DISPOSABLE) ×2 IMPLANT
GOWN STRL REUS W/TWL LRG LVL3 (GOWN DISPOSABLE) ×6
LENS IOL TECNIS EYHANCE 23.5 (Intraocular Lens) ×2 IMPLANT
MARKER SKIN DUAL TIP RULER LAB (MISCELLANEOUS) ×3 IMPLANT
NDL FILTER BLUNT 18X1 1/2 (NEEDLE) ×1 IMPLANT
NEEDLE FILTER BLUNT 18X 1/2SAF (NEEDLE) ×2
NEEDLE FILTER BLUNT 18X1 1/2 (NEEDLE) ×1 IMPLANT
PACK EYE AFTER SURG (MISCELLANEOUS) ×3 IMPLANT
PACK OPTHALMIC (MISCELLANEOUS) ×3 IMPLANT
PACK PORFILIO (MISCELLANEOUS) ×3 IMPLANT
SYR 3ML LL SCALE MARK (SYRINGE) ×3 IMPLANT
SYR TB 1ML LUER SLIP (SYRINGE) ×3 IMPLANT
WATER STERILE IRR 250ML POUR (IV SOLUTION) ×3 IMPLANT
WIPE NON LINTING 3.25X3.25 (MISCELLANEOUS) ×3 IMPLANT

## 2020-07-09 NOTE — Transfer of Care (Signed)
Immediate Anesthesia Transfer of Care Note  Patient: Sara [name]  Procedure(s) Performed: CATARACT EXTRACTION PHACO AND INTRAOCULAR LENS PLACEMENT (IOC) LEFT DIABETIC 9.99 01:19.3  (Left Eye)  Patient Location: PACU  Anesthesia Type: MAC  Level of Consciousness: awake, alert  and patient cooperative  Airway and Oxygen Therapy: Patient Spontanous Breathing and Patient connected to supplemental oxygen  Post-op Assessment: Post-op Vital signs reviewed, Patient's Cardiovascular Status Stable, Respiratory Function Stable, Patent Airway and No signs of Nausea or vomiting  Post-op Vital Signs: Reviewed and stable  Complications: No complications documented.

## 2020-07-09 NOTE — Op Note (Signed)
PREOPERATIVE DIAGNOSIS:  Nuclear sclerotic cataract of the left eye.   POSTOPERATIVE DIAGNOSIS:  Nuclear sclerotic cataract of the left eye.   OPERATIVE PROCEDURE:@   SURGEON:  Galen Manila, MD.   ANESTHESIA:  No anesthesia staff entered.  1.      Managed anesthesia care. 2.     0.56ml of Shugarcaine was instilled following the paracentesis   COMPLICATIONS:  None.   TECHNIQUE:   Stop and chop   DESCRIPTION OF PROCEDURE:  The patient was examined and consented in the preoperative holding area where the aforementioned topical anesthesia was applied to the left eye and then brought back to the Operating Room where the left eye was prepped and draped in the usual sterile ophthalmic fashion and a lid speculum was placed. A paracentesis was created with the side port blade and the anterior chamber was filled with viscoelastic. A near clear corneal incision was performed with the steel keratome. A continuous curvilinear capsulorrhexis was performed with a cystotome followed by the capsulorrhexis forceps. Hydrodissection and hydrodelineation were carried out with BSS on a blunt cannula. The lens was removed in a stop and chop  technique and the remaining cortical material was removed with the irrigation-aspiration handpiece. The capsular bag was inflated with viscoelastic and the Technis [id number] lens was placed in the capsular bag without complication. The remaining viscoelastic was removed from the eye with the irrigation-aspiration handpiece. The wounds were hydrated. The anterior chamber was flushed with BSS and the eye was inflated to physiologic pressure. 0.33ml Vigamox was placed in the anterior chamber. The wounds were found to be water tight. The eye was dressed with Combigan. The patient was given protective glasses to wear throughout the day and a shield with which to sleep tonight. The patient was also given drops with which to begin a drop regimen today and will follow-up with me in one  day. Implant Name Type Inv. Item Serial No. Manufacturer Lot No. LRB No. Used Action  LENS IOL TECNIS EYHANCE 23.5 - [id number] Intraocular Lens LENS IOL TECNIS EYHANCE 23.5 [id number] JOHNSON   Left 1 Implanted    Procedure(s) with comments: CATARACT EXTRACTION PHACO AND INTRAOCULAR LENS PLACEMENT (IOC) LEFT DIABETIC 9.99 01:19.3  (Left) - Diabetic - insulin  Electronically signed: Galen Manila 07/09/2020 8:45 AM

## 2020-07-09 NOTE — Anesthesia Procedure Notes (Signed)
Procedure Name: MAC Date/Time: 07/09/2020 8:22 AM Performed by: Cameron Ali, CRNA Pre-anesthesia Checklist: Patient identified, Emergency Drugs available, Suction available, Timeout performed and Patient being monitored Patient Re-evaluated:Patient Re-evaluated prior to induction Oxygen Delivery Method: Nasal cannula Placement Confirmation: positive ETCO2

## 2020-07-09 NOTE — Anesthesia Postprocedure Evaluation (Signed)
Anesthesia Post Note  Patient: Sara [name]  Procedure(s) Performed: CATARACT EXTRACTION PHACO AND INTRAOCULAR LENS PLACEMENT (IOC) LEFT DIABETIC 9.99 01:19.3  (Left Eye)     Patient location during evaluation: PACU Anesthesia Type: MAC Level of consciousness: awake and alert Pain management: pain level controlled Vital Signs Assessment: post-procedure vital signs reviewed and stable Respiratory status: spontaneous breathing, nonlabored ventilation, respiratory function stable and patient connected to nasal cannula oxygen Cardiovascular status: stable and blood pressure returned to baseline Postop Assessment: no apparent nausea or vomiting Anesthetic complications: no   No complications documented.  [name]  [name]

## 2020-07-09 NOTE — Anesthesia Preprocedure Evaluation (Signed)
Anesthesia Evaluation  Patient identified by MRN, date of birth, ID band Patient awake    Reviewed: Allergy & Precautions, H&P , NPO status , Patient's Chart, lab work & pertinent test results, reviewed documented beta blocker date and time   History of Anesthesia Complications Negative for: history of anesthetic complications  Airway Mallampati: III  TM Distance: >3 FB Neck ROM: full    Dental no notable dental hx.    Pulmonary asthma , Current Smoker and Patient abstained from smoking.,    Pulmonary exam normal breath sounds clear to auscultation       Cardiovascular hypertension, (-) angina(-) DOE Normal cardiovascular exam Rhythm:regular Rate:Normal     Neuro/Psych  Headaches, Seizures -,     GI/Hepatic neg GERD  ,  Endo/Other  diabetes, Type 1  Renal/GU      Musculoskeletal  (+) Arthritis ,  Ehlers-Danlos   Abdominal (+) + obese (BMI 30),   Peds  Hematology   Anesthesia Other Findings   Reproductive/Obstetrics                             Anesthesia Physical  Anesthesia Plan  ASA: II  Anesthesia Plan: MAC   Post-op Pain Management:    Induction: Intravenous  PONV Risk Score and Plan: 2 and Treatment may vary due to age or medical condition, TIVA and Midazolam  Airway Management Planned: Nasal Cannula  Additional Equipment:   Intra-op Plan:   Post-operative Plan:   Informed Consent: I have reviewed the patients History and Physical, chart, labs and discussed the procedure including the risks, benefits and alternatives for the proposed anesthesia with the patient or authorized representative who has indicated his/her understanding and acceptance.     Dental Advisory Given  Plan Discussed with: CRNA  Anesthesia Plan Comments:         Anesthesia Quick Evaluation

## 2020-07-09 NOTE — H&P (Signed)
[facility]   Primary Care Physician:  Thomes Dinning, MD Ophthalmologist: Dr. Druscilla Brownie  Pre-Procedure History & Physical: HPI:  Sara [name] is a 44 y.o. female here for cataract surgery.   Past Medical History:  Diagnosis Date  . Adopted   . Arthritis    hips, lower back  . Asthma   . Complication of anesthesia    Was able to feel procedure during neck surgery  . Diabetes mellitus without complication (HCC)   . Ehlers-Danlos disease   . Epilepsy (HCC)   . Hypertension   . Migraine headache    approx 6x yr (now getting injections from neurology)  . Seizures (HCC)    most recent 05/2020    Past Surgical History:  Procedure Laterality Date  . CARPAL TUNNEL RELEASE    . CATARACT EXTRACTION W/PHACO Right 06/18/2020   Procedure: CATARACT EXTRACTION PHACO AND INTRAOCULAR LENS PLACEMENT (IOC) RIGHT DIABETIC 13.23 01:42.7;  Surgeon: Galen Manila, MD;  Location: [facility];  Service: Ophthalmology;  Laterality: Right;  Diabetic - insulin  . CERVICAL FUSION    . CESAREAN SECTION  x3  . HIP ARTHROSCOPY    . PARTIAL HYSTERECTOMY      Prior to Admission medications   Medication Sig Start Date End Date Taking? Authorizing Provider  albuterol (PROVENTIL HFA;VENTOLIN HFA) 108 (90 Base) MCG/ACT inhaler Inhale 2 to 3 puffs into the lungs every 6 hours as needed (respiratory problems)   Yes [provider]  Cholecalciferol (VITAMIN D3) 50 MCG (2000 UT) CAPS Take by mouth daily.   Yes [provider]  Cyanocobalamin (VITAMIN B-12 IJ) Inject as directed every 30 (thirty) days.   Yes [provider]  FERROUS SULFATE PO Take by mouth daily.   Yes [provider]  gabapentin (NEURONTIN) 100 MG capsule Take 100 mg by mouth 3 (three) times daily.   Yes [provider]  glucagon (GLUCAGEN) 1 MG SOLR injection Inject 1 mg into the vein once as needed for low blood sugar.   Yes [provider]  Insulin Glargine  (TOUJEO SOLOSTAR [place]) Inject 34 Units into the skin at bedtime.   Yes [provider]  insulin lispro (HUMALOG) 100 UNIT/ML injection Inject into the skin 3 (three) times daily before meals. Sliding scale   Yes [provider]  lamoTRIgine (LAMICTAL) 100 MG tablet Take 1 tablet (100 mg total) by mouth 2 (two) times daily. Patient taking differently: Take 250 mg by mouth 2 (two) times daily.  03/13/18 07/09/20 Yes Pyreddy, Vivien Rota, MD  levETIRAcetam (KEPPRA) 750 MG tablet Take 750 mg by mouth 2 (two) times daily.   Yes [provider]  losartan (COZAAR) 25 MG tablet Take 25 mg by mouth daily.   Yes [provider]  montelukast (SINGULAIR) 10 MG tablet Take 10 mg by mouth at bedtime.   Yes [provider]  senna (SENOKOT) 8.6 MG tablet Take 1 tablet by mouth daily.   Yes [provider]  topiramate (TOPAMAX) 100 MG tablet Take 1 tablet (100 mg total) by mouth 4 (four) times daily. Patient taking differently: Take 100 mg by mouth 2 (two) times daily.  03/13/18 07/09/20 Yes Pyreddy, Vivien Rota, MD  insulin aspart (NOVOLOG) 100 UNIT/ML injection For insulin pump - administration average 160u over course of day Patient not taking: Reported on 06/12/2020    [provider]  insulin detemir (LEVEMIR) 100 UNIT/ML injection Inject 40 Units into the skin 2 (two) times daily. (USE ONLY IF INSULIN PUMP FAILS)  Patient not taking: Reported on 06/12/2020    [provider]  oxycodone (OXY-IR) 5 MG capsule Take 5 mg by mouth. Patient not taking: Reported on 06/12/2020    [provider]  rosuvastatin (CRESTOR) 20 MG tablet Take 20 mg by mouth daily.    [provider]    Allergies as of 06/21/2020 - Review Complete 06/18/2020  Allergen Reaction Noted  . Coconut oil Itching and Swelling 06/12/2020  . Latex Itching and Rash 04/03/2015  . Tape Rash 04/03/2015    Family History  Problem Relation Age of Onset  . Hypertension Other      Social History   Socioeconomic History  . Marital status: Single    Spouse name: Not on file  . Number of children: Not on file  . Years of education: Not on file  . Highest education level: Not on file  Occupational History  . Not on file  Tobacco Use  . Smoking status: Current Every Day Smoker    Packs/day: 0.33    Types: Cigarettes  . Smokeless tobacco: Never Used  . Tobacco comment: "off and on" since age 2  Vaping Use  . Vaping Use: Never used  Substance and Sexual Activity  . Alcohol use: Yes    Alcohol/week: 1.0 standard drink    Types: 1 Glasses of wine per week  . Drug use: Never  . Sexual activity: Yes  Other Topics Concern  . Not on file  Social History Narrative   Lives with parents. Independent.   Social Determinants of Health   Financial Resource Strain:   . Difficulty of Paying Living Expenses: Not on file  Food Insecurity:   . Worried About Programme researcher, broadcasting/film/video in the Last Year: Not on file  . Ran Out of Food in the Last Year: Not on file  Transportation Needs:   . Lack of Transportation (Medical): Not on file  . Lack of Transportation (Non-Medical): Not on file  Physical Activity:   . Days of Exercise per Week: Not on file  . Minutes of Exercise per Session: Not on file  Stress:   . Feeling of Stress : Not on file  Social Connections:   . Frequency of Communication with Friends and Family: Not on file  . Frequency of Social Gatherings with Friends and Family: Not on file  . Attends Religious Services: Not on file  . Active Member of Clubs or Organizations: Not on file  . Attends Banker Meetings: Not on file  . Marital Status: Not on file  Intimate Partner Violence:   . Fear of Current or Ex-Partner: Not on file  . Emotionally Abused: Not on file  . Physically Abused: Not on file  . Sexually Abused: Not on file    Review of Systems: See HPI, otherwise negative ROS  Physical Exam: BP (!) 145/94   Pulse (!) 105   Temp  (!) 97.5 F (36.4 C) (Temporal)   Resp 16   Ht 5\' 2"  (1.575 m)   Wt 75.3 kg   SpO2 100%   BMI 30.36 kg/m  General:   Alert,  pleasant and cooperative in NAD Head:  Normocephalic and atraumatic. Respiratory:  Normal work of breathing. Heart:  Regular rate and rhythm.  Impression/Plan: Alice Burnside is here for cataract surgery.  Risks, benefits, limitations, and alternatives regarding cataract surgery have been reviewed with the patient.  Questions have been answered.  All parties agreeable.   Rockey Situ, MD  07/09/2020, 8:14  AM

## 2020-10-15 DIAGNOSIS — I639 Cerebral infarction, unspecified: Secondary | ICD-10-CM

## 2020-10-15 HISTORY — DX: Cerebral infarction, unspecified: I63.9

## 2020-11-05 DIAGNOSIS — F17200 Nicotine dependence, unspecified, uncomplicated: Secondary | ICD-10-CM | POA: Insufficient documentation

## 2021-02-14 DIAGNOSIS — H3500 Unspecified background retinopathy: Secondary | ICD-10-CM | POA: Insufficient documentation

## 2021-02-14 DIAGNOSIS — M160 Bilateral primary osteoarthritis of hip: Secondary | ICD-10-CM | POA: Insufficient documentation

## 2021-03-03 DIAGNOSIS — E1065 Type 1 diabetes mellitus with hyperglycemia: Secondary | ICD-10-CM | POA: Insufficient documentation

## 2021-03-17 ENCOUNTER — Emergency Department
Admission: EM | Admit: 2021-03-17 | Discharge: 2021-03-17 | Disposition: A | Discharge status: Home / Self Care | Payer: Medicare Other | Attending: Emergency Medicine | Admitting: Emergency Medicine

## 2021-03-17 ENCOUNTER — Other Ambulatory Visit: Payer: Self-pay

## 2021-03-17 DIAGNOSIS — R140 Abdominal distension (gaseous): Secondary | ICD-10-CM | POA: Diagnosis not present

## 2021-03-17 DIAGNOSIS — Z5321 Procedure and treatment not carried out due to patient leaving prior to being seen by health care provider: Secondary | ICD-10-CM | POA: Diagnosis not present

## 2021-03-17 DIAGNOSIS — R1031 Right lower quadrant pain: Secondary | ICD-10-CM | POA: Diagnosis not present

## 2021-03-17 DIAGNOSIS — R1110 Vomiting, unspecified: Secondary | ICD-10-CM | POA: Diagnosis not present

## 2021-03-17 HISTORY — DX: Blindness, one eye, unspecified eye: H54.40

## 2021-03-17 LAB — CBC
HCT: 37.8 % (ref 36.0–46.0)
Hemoglobin: 11.9 g/dL — ABNORMAL LOW (ref 12.0–15.0)
MCH: 24.7 pg — ABNORMAL LOW (ref 26.0–34.0)
MCHC: 31.5 g/dL (ref 30.0–36.0)
MCV: 78.4 fL — ABNORMAL LOW (ref 80.0–100.0)
Platelets: 190 10*3/uL (ref 150–400)
RBC: 4.82 MIL/uL (ref 3.87–5.11)
RDW: 13.8 % (ref 11.5–15.5)
WBC: 8.3 10*3/uL (ref 4.0–10.5)
nRBC: 0 % (ref 0.0–0.2)

## 2021-03-17 LAB — COMPREHENSIVE METABOLIC PANEL
ALT: 18 U/L (ref 0–44)
AST: 18 U/L (ref 15–41)
Albumin: 3.7 g/dL (ref 3.5–5.0)
Alkaline Phosphatase: 73 U/L (ref 38–126)
Anion gap: 9 (ref 5–15)
BUN: 7 mg/dL (ref 6–20)
CO2: 27 mmol/L (ref 22–32)
Calcium: 8.9 mg/dL (ref 8.9–10.3)
Chloride: 99 mmol/L (ref 98–111)
Creatinine, Ser: 0.48 mg/dL (ref 0.44–1.00)
GFR, Estimated: >60 mL/min (ref >60)
Glucose, Bld: 255 mg/dL — ABNORMAL HIGH (ref 70–99)
Potassium: 3.7 mmol/L (ref 3.5–5.1)
Sodium: 135 mmol/L (ref 135–145)
Total Bilirubin: 0.7 mg/dL (ref 0.3–1.2)
Total Protein: 7.5 g/dL (ref 6.5–8.1)

## 2021-03-17 LAB — LIPASE, BLOOD: Lipase: 59 U/L — ABNORMAL HIGH (ref 11–51)

## 2021-03-17 NOTE — ED Triage Notes (Signed)
Pt arrives to triage via EMS: pt reports RLQ abdominal pain and distention onset 3pm yesterday associated with two episodes of emesis.

## 2021-03-17 NOTE — ED Notes (Signed)
Pt assisted to BR by Latanya Presser RN

## 2021-03-17 NOTE — ED Notes (Signed)
Pt decided to leave to go to [facility], pt taken via wheelchair out to car with family.

## 2021-10-31 DIAGNOSIS — M7989 Other specified soft tissue disorders: Secondary | ICD-10-CM | POA: Insufficient documentation

## 2022-01-06 DIAGNOSIS — E669 Obesity, unspecified: Secondary | ICD-10-CM | POA: Insufficient documentation

## 2022-02-10 DIAGNOSIS — R252 Cramp and spasm: Secondary | ICD-10-CM | POA: Insufficient documentation

## 2022-03-17 DIAGNOSIS — K219 Gastro-esophageal reflux disease without esophagitis: Secondary | ICD-10-CM | POA: Insufficient documentation

## 2022-03-28 ENCOUNTER — Emergency Department (HOSPITAL_COMMUNITY)
Admission: EM | Admit: 2022-03-28 | Discharge: 2022-03-29 | Disposition: A | Discharge status: Home / Self Care | Payer: Medicare Other | Attending: Emergency Medicine | Admitting: Emergency Medicine

## 2022-03-28 ENCOUNTER — Encounter (HOSPITAL_COMMUNITY): Payer: Self-pay | Admitting: *Deleted

## 2022-03-28 ENCOUNTER — Other Ambulatory Visit: Payer: Self-pay

## 2022-03-28 ENCOUNTER — Emergency Department (HOSPITAL_COMMUNITY): Payer: Medicare Other

## 2022-03-28 DIAGNOSIS — S93401A Sprain of unspecified ligament of right ankle, initial encounter: Secondary | ICD-10-CM | POA: Insufficient documentation

## 2022-03-28 DIAGNOSIS — Y92410 Unspecified street and highway as the place of occurrence of the external cause: Secondary | ICD-10-CM | POA: Diagnosis not present

## 2022-03-28 DIAGNOSIS — S99911A Unspecified injury of right ankle, initial encounter: Secondary | ICD-10-CM | POA: Diagnosis present

## 2022-03-28 DIAGNOSIS — R1030 Lower abdominal pain, unspecified: Secondary | ICD-10-CM | POA: Diagnosis not present

## 2022-03-28 LAB — URINALYSIS, ROUTINE W REFLEX MICROSCOPIC
Bilirubin Urine: NEGATIVE
Glucose, UA: 50 mg/dL — AB
Ketones, ur: NEGATIVE mg/dL
Leukocytes,Ua: NEGATIVE
Nitrite: NEGATIVE
Protein, ur: 30 mg/dL — AB
Specific Gravity, Urine: 1.015 (ref 1.005–1.030)
pH: 5 (ref 5.0–8.0)

## 2022-03-28 LAB — COMPREHENSIVE METABOLIC PANEL
ALT: 15 U/L (ref 0–44)
AST: 15 U/L (ref 15–41)
Albumin: 3.3 g/dL — ABNORMAL LOW (ref 3.5–5.0)
Alkaline Phosphatase: 69 U/L (ref 38–126)
Anion gap: 7 (ref 5–15)
BUN: 11 mg/dL (ref 6–20)
CO2: 26 mmol/L (ref 22–32)
Calcium: 9 mg/dL (ref 8.9–10.3)
Chloride: 105 mmol/L (ref 98–111)
Creatinine, Ser: 0.73 mg/dL (ref 0.44–1.00)
GFR, Estimated: >60 mL/min (ref >60)
Glucose, Bld: 196 mg/dL — ABNORMAL HIGH (ref 70–99)
Potassium: 3.5 mmol/L (ref 3.5–5.1)
Sodium: 138 mmol/L (ref 135–145)
Total Bilirubin: 0.3 mg/dL (ref 0.3–1.2)
Total Protein: 6.6 g/dL (ref 6.5–8.1)

## 2022-03-28 LAB — CBC
HCT: 33.3 % — ABNORMAL LOW (ref 36.0–46.0)
Hemoglobin: 10.1 g/dL — ABNORMAL LOW (ref 12.0–15.0)
MCH: 24.2 pg — ABNORMAL LOW (ref 26.0–34.0)
MCHC: 30.3 g/dL (ref 30.0–36.0)
MCV: 79.7 fL — ABNORMAL LOW (ref 80.0–100.0)
Platelets: 199 10*3/uL (ref 150–400)
RBC: 4.18 MIL/uL (ref 3.87–5.11)
RDW: 14.6 % (ref 11.5–15.5)
WBC: 6.7 10*3/uL (ref 4.0–10.5)
nRBC: 0 % (ref 0.0–0.2)

## 2022-03-28 LAB — LIPASE, BLOOD: Lipase: 41 U/L (ref 11–51)

## 2022-03-28 MED ORDER — HYDROMORPHONE HCL 1 MG/ML IJ SOLN
1.0000 mg | Freq: Once | INTRAMUSCULAR | Status: AC
  Administered 2022-03-28: 1 mg via INTRAVENOUS
  Filled 2022-03-28: qty 1

## 2022-03-28 MED ORDER — ONDANSETRON HCL 4 MG/2ML IJ SOLN
4.0000 mg | Freq: Once | INTRAMUSCULAR | Status: AC
  Administered 2022-03-28: 4 mg via INTRAVENOUS
  Filled 2022-03-28: qty 2
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
## 2022-03-29 ENCOUNTER — Emergency Department (HOSPITAL_COMMUNITY): Payer: Medicare Other

## 2022-03-29 MED ORDER — IOHEXOL 300 MG/ML  SOLN
100.0000 mL | Freq: Once | INTRAMUSCULAR | Status: AC | PRN
  Administered 2022-03-29: 100 mL via INTRAVENOUS

## 2022-03-29 MED ORDER — HYDROCODONE-ACETAMINOPHEN 5-325 MG PO TABS: 1.0000 | ORAL_TABLET | Freq: Four times a day (QID) | ORAL | 0 refills | Status: DC | PRN

## 2022-03-29 MED ORDER — CYCLOBENZAPRINE HCL 10 MG PO TABS: 10.0000 mg | ORAL_TABLET | Freq: Three times a day (TID) | ORAL | 0 refills | Status: AC | PRN

## 2022-03-29 NOTE — Progress Notes (Signed)
Orthopedic Tech Progress Note Patient Details:  Sara [name] 02/24/1976 [id number]  Ortho Devices Type of Ortho Device: Crutches, CAM walker Ortho Device/Splint Location: rle Ortho Device/Splint Interventions: Ordered, Application, Adjustment   Post Interventions Patient Tolerated: Well Instructions Provided: Care of device, Adjustment of device  Trinna Post 03/29/2022, 12:29 AM

## 2022-04-01 ENCOUNTER — Ambulatory Visit (INDEPENDENT_AMBULATORY_CARE_PROVIDER_SITE_OTHER): Payer: Medicare Other

## 2022-04-01 ENCOUNTER — Encounter: Payer: Self-pay | Admitting: Podiatry

## 2022-04-01 ENCOUNTER — Ambulatory Visit (INDEPENDENT_AMBULATORY_CARE_PROVIDER_SITE_OTHER): Payer: Medicare Other | Admitting: Podiatry

## 2022-04-01 DIAGNOSIS — E559 Vitamin D deficiency, unspecified: Secondary | ICD-10-CM | POA: Insufficient documentation

## 2022-04-01 DIAGNOSIS — E669 Obesity, unspecified: Secondary | ICD-10-CM | POA: Insufficient documentation

## 2022-04-01 DIAGNOSIS — E11610 Type 2 diabetes mellitus with diabetic neuropathic arthropathy: Secondary | ICD-10-CM

## 2022-04-01 DIAGNOSIS — J4520 Mild intermittent asthma, uncomplicated: Secondary | ICD-10-CM | POA: Insufficient documentation

## 2022-04-01 DIAGNOSIS — IMO0002 Reserved for concepts with insufficient information to code with codable children: Secondary | ICD-10-CM | POA: Insufficient documentation

## 2022-04-01 DIAGNOSIS — E538 Deficiency of other specified B group vitamins: Secondary | ICD-10-CM | POA: Insufficient documentation

## 2022-04-01 DIAGNOSIS — J45909 Unspecified asthma, uncomplicated: Secondary | ICD-10-CM | POA: Insufficient documentation

## 2022-04-01 NOTE — Patient Instructions (Signed)
[facility] [address] [place], [place] [address] Phone: [phone] Fax: [phone]

## 2022-04-05 NOTE — Progress Notes (Signed)
  Subjective:  Patient ID: Sara [name], female    DOB: 09-Mar-1976,  MRN: [id number]  Chief Complaint  Patient presents with   Diabetes   Peripheral Neuropathy   Charcot disease    Patient is here for second opinion    46 y.o. female presents with the above complaint. History confirmed with patient.  She has been dealing this for a few months waxing and waning, she has been in a CAM boot walking for nearly 2 months now  Objective:  Physical Exam: warm, good capillary refill, no trophic changes or ulcerative lesions, normal DP and PT pulses, and abnormal sensory exam with loss of protective sensation, there is edema in the right midfoot.  Overall foot is plantigrade.  No ulceration.  No skin breakdown or preulcerative callus   Radiographs: Multiple views x-ray of the right foot: Overall plantigrade foot type, pes planus deformity noted, there is some subluxation of the tarsometatarsal joints Assessment:   1. Charcot foot due to diabetes mellitus (HCC)      Plan:  Patient was evaluated and treated and all questions answered.  We discussed and reviewed the radiographs with him today and the etiology and treatment options of Charcot neuroarthropathy in detail.  We discussed operative and nonoperative treatment.  Currently there is no indication for surgical reconstruction but I can see.  She appears to be in a stable plantigrade position.  She has been immobilized in a cam boot for nearly 2 months now.  Appears to be transitioning out of the acute phase and her Charcot seems to be quiescent.  I recommended a long-term brace to be made and a prescription for a CROW AFO was written for her and she will obtain this at [facility].  I will see her back in 1 month for new x-rays  No follow-ups on file.

## 2022-04-30 ENCOUNTER — Ambulatory Visit (INDEPENDENT_AMBULATORY_CARE_PROVIDER_SITE_OTHER): Payer: Medicare Other

## 2022-04-30 ENCOUNTER — Ambulatory Visit (INDEPENDENT_AMBULATORY_CARE_PROVIDER_SITE_OTHER): Payer: Medicare Other | Admitting: Podiatry

## 2022-04-30 DIAGNOSIS — E11610 Type 2 diabetes mellitus with diabetic neuropathic arthropathy: Secondary | ICD-10-CM

## 2022-04-30 DIAGNOSIS — M14671 Charcot's joint, right ankle and foot: Secondary | ICD-10-CM

## 2022-05-03 NOTE — Progress Notes (Signed)
  Subjective:  Patient ID: Sara [name], female    DOB: Mar 27, 1976,  MRN: [id number]  Chief Complaint  Patient presents with   Routine Post Op    Ankle pain at a 7 today    46 y.o. female returns for follow-up with the above complaint. History confirmed with patient.  Says it has been doing well no increased swelling or pain  Objective:  Physical Exam: warm, good capillary refill, no trophic changes or ulcerative lesions, normal DP and PT pulses, and abnormal sensory exam with loss of protective sensation, there is today minimal edema in the right midfoot.  Overall foot is plantigrade.  No ulceration.  No skin breakdown or preulcerative callus   Radiographs: Multiple views x-ray of the right foot: Overall plantigrade foot type, pes planus deformity noted, there is some subluxation of the tarsometatarsal joints, overall unchanged alignment since last visit Assessment:   1. Charcot ankle, right   2. Charcot foot due to diabetes mellitus ([place])      Plan:  Patient was evaluated and treated and all questions answered. Overall set improvement and seems stable.  She was casted for her Crow orthosis yesterday.  Expect she will need this long-term should be ready in 3 to 4 weeks.  I recommend until she receives this to remain in her cam boot.  I will see her back in 6 weeks for new x-rays.  Return in about 6 weeks (around 06/11/2022) for follow up with new xrays right foot.

## 2022-05-21 ENCOUNTER — Ambulatory Visit (INDEPENDENT_AMBULATORY_CARE_PROVIDER_SITE_OTHER): Payer: Medicare Other | Admitting: Podiatry

## 2022-05-21 ENCOUNTER — Telehealth: Payer: Self-pay | Admitting: *Deleted

## 2022-05-21 DIAGNOSIS — L959 Vasculitis limited to the skin, unspecified: Secondary | ICD-10-CM | POA: Diagnosis not present

## 2022-05-21 DIAGNOSIS — L309 Dermatitis, unspecified: Secondary | ICD-10-CM

## 2022-05-21 MED ORDER — BETAMETHASONE DIPROPIONATE 0.05 % EX CREA: TOPICAL_CREAM | Freq: Two times a day (BID) | CUTANEOUS | 1 refills | Status: DC

## 2022-05-21 MED ORDER — TRAMADOL HCL 50 MG PO TABS: 50.0000 mg | ORAL_TABLET | Freq: Four times a day (QID) | ORAL | 0 refills | Status: AC | PRN

## 2022-05-21 NOTE — Telephone Encounter (Signed)
Patient is calling in because she has just noticed a rash on the top of  right foot, feels sore, scaly, is starting to turn black w/ redness in some places.Please advise / schedule for a new problem appointment.

## 2022-05-22 NOTE — Telephone Encounter (Signed)
Patient is also requesting the results of her blood work from 1 day ago. Please advise.

## 2022-05-22 NOTE — Telephone Encounter (Signed)
Patient notified

## 2022-05-23 LAB — CBC WITH DIFFERENTIAL/PLATELET
Basophils Absolute: 0 10*3/uL (ref 0.0–0.2)
Basos: 0 %
EOS (ABSOLUTE): 0.1 10*3/uL (ref 0.0–0.4)
Eos: 2 %
Hematocrit: 35.9 % (ref 34.0–46.6)
Hemoglobin: 11.1 g/dL (ref 11.1–15.9)
Immature Grans (Abs): 0 10*3/uL (ref 0.0–0.1)
Immature Granulocytes: 0 %
Lymphocytes Absolute: 1.5 10*3/uL (ref 0.7–3.1)
Lymphs: 22 %
MCH: 24.6 pg — ABNORMAL LOW (ref 26.6–33.0)
MCHC: 30.9 g/dL — ABNORMAL LOW (ref 31.5–35.7)
MCV: 80 fL (ref 79–97)
Monocytes Absolute: 0.4 10*3/uL (ref 0.1–0.9)
Monocytes: 6 %
Neutrophils Absolute: 4.7 10*3/uL (ref 1.4–7.0)
Neutrophils: 70 %
Platelets: 194 10*3/uL (ref 150–450)
RBC: 4.51 x10E6/uL (ref 3.77–5.28)
RDW: 13 % (ref 11.7–15.4)
WBC: 6.8 10*3/uL (ref 3.4–10.8)

## 2022-05-23 LAB — ANCA PROFILE
Anti-MPO Antibodies: <0.2 units (ref 0.0–0.9)
Anti-PR3 Antibodies: <0.2 units (ref 0.0–0.9)
Atypical pANCA: <1:20 {titer}
C-ANCA: <1:20 {titer}
P-ANCA: <1:20 {titer}

## 2022-05-23 LAB — BASIC METABOLIC PANEL
BUN/Creatinine Ratio: 13 (ref 9–23)
BUN: 10 mg/dL (ref 6–24)
CO2: 22 mmol/L (ref 20–29)
Calcium: 9.1 mg/dL (ref 8.7–10.2)
Chloride: 102 mmol/L (ref 96–106)
Creatinine, Ser: 0.79 mg/dL (ref 0.57–1.00)
Glucose: 167 mg/dL — ABNORMAL HIGH (ref 70–99)
Potassium: 4.4 mmol/L (ref 3.5–5.2)
Sodium: 139 mmol/L (ref 134–144)
eGFR: 93 mL/min/{1.73_m2} (ref >59)

## 2022-05-23 LAB — C3 AND C4
Complement C3, Serum: 169 mg/dL — ABNORMAL HIGH (ref 82–167)
Complement C4, Serum: 38 mg/dL (ref 12–38)

## 2022-05-23 LAB — VON WILLEBRAND PANEL
Factor VIII Activity: 203 % — ABNORMAL HIGH (ref 56–140)
Von Willebrand Ag: 204 % — ABNORMAL HIGH (ref 50–200)
Von Willebrand Factor: 104 % (ref 50–200)

## 2022-05-23 LAB — C-REACTIVE PROTEIN: CRP: 2 mg/L (ref 0–10)

## 2022-05-23 LAB — COAG STUDIES INTERP REPORT

## 2022-05-23 LAB — SEDIMENTATION RATE: Sed Rate: 55 mm/hr — ABNORMAL HIGH (ref 0–32)

## 2022-05-25 NOTE — Progress Notes (Signed)
  Subjective:  Patient ID: Parks Neptune, female    DOB: 01-03-1976,  MRN: [id number]  Chief Complaint  Patient presents with   Rash    Urgent work in - Pt has rash on R foot thats turning top of foot black. (okay Per [place]) Very painful, top of right foot.    46 y.o. female presents with the above complaint. History confirmed with patient.  New issue that developed quite quickly over the last day no change in shoe gear soaps lotions or cosmetic products that she knows of  Objective:  Physical Exam: warm, good capillary refill, no trophic changes or ulcerative lesions, normal DP and PT pulses, normal sensory exam, and dermatitis rash on the dorsal portion of the right foot and lateral ankle with small hemorrhagic petechiae.     Assessment:   1. Dermatitis of right foot   2. Vasculitis of skin      Plan:  Patient was evaluated and treated and all questions answered.  Discussed with her this is likely a dermatitis or vasculitis eruption.  I did order lab work to evaluate for the possibility of vasculitis and reviewed the results of this with her.  I discussed that I would recommend a referral to rheumatology which was placed.  Rx for topical betamethasone sent to pharmacy as well as tramadol for pain control that is very painful for her.  I will see her back at her scheduled visit next month  No follow-ups on file.

## 2022-05-26 NOTE — Progress Notes (Signed)
Referral documents were faxed today to [facility].

## 2022-05-28 ENCOUNTER — Telehealth: Payer: Self-pay | Admitting: *Deleted

## 2022-05-28 NOTE — Telephone Encounter (Signed)
Patient is calling to request another location for [facility], booked out until Jan./April She does not want to wait until then, Will send referral to [facility]-[phone] for first available, has openings next month.

## 2022-05-29 ENCOUNTER — Other Ambulatory Visit: Payer: Self-pay

## 2022-05-29 ENCOUNTER — Emergency Department (HOSPITAL_COMMUNITY): Payer: Medicare Other

## 2022-05-29 ENCOUNTER — Encounter (HOSPITAL_COMMUNITY): Payer: Self-pay

## 2022-05-29 ENCOUNTER — Emergency Department (HOSPITAL_COMMUNITY)
Admission: EM | Admit: 2022-05-29 | Discharge: 2022-05-29 | Disposition: A | Discharge status: Home / Self Care | Payer: Medicare Other | Attending: Emergency Medicine | Admitting: Emergency Medicine

## 2022-05-29 ENCOUNTER — Telehealth: Payer: Self-pay | Admitting: Podiatry

## 2022-05-29 DIAGNOSIS — S91301A Unspecified open wound, right foot, initial encounter: Secondary | ICD-10-CM

## 2022-05-29 DIAGNOSIS — R569 Unspecified convulsions: Secondary | ICD-10-CM | POA: Diagnosis present

## 2022-05-29 DIAGNOSIS — Z91040 Latex allergy status: Secondary | ICD-10-CM | POA: Insufficient documentation

## 2022-05-29 DIAGNOSIS — D649 Anemia, unspecified: Secondary | ICD-10-CM | POA: Insufficient documentation

## 2022-05-29 DIAGNOSIS — E109 Type 1 diabetes mellitus without complications: Secondary | ICD-10-CM | POA: Insufficient documentation

## 2022-05-29 DIAGNOSIS — Z794 Long term (current) use of insulin: Secondary | ICD-10-CM | POA: Diagnosis not present

## 2022-05-29 DIAGNOSIS — G40909 Epilepsy, unspecified, not intractable, without status epilepticus: Secondary | ICD-10-CM | POA: Diagnosis not present

## 2022-05-29 DIAGNOSIS — L089 Local infection of the skin and subcutaneous tissue, unspecified: Secondary | ICD-10-CM

## 2022-05-29 LAB — I-STAT VENOUS BLOOD GAS, ED
Acid-base deficit: 2 mmol/L (ref 0.0–2.0)
Bicarbonate: 22.5 mmol/L (ref 20.0–28.0)
Calcium, Ion: 1.21 mmol/L (ref 1.15–1.40)
HCT: 35 % — ABNORMAL LOW (ref 36.0–46.0)
Hemoglobin: 11.9 g/dL — ABNORMAL LOW (ref 12.0–15.0)
O2 Saturation: 78 %
Potassium: 3.9 mmol/L (ref 3.5–5.1)
Sodium: 136 mmol/L (ref 135–145)
TCO2: 24 mmol/L (ref 22–32)
pCO2, Ven: 35.4 mmHg — ABNORMAL LOW (ref 44–60)
pH, Ven: 7.411 (ref 7.25–7.43)
pO2, Ven: 42 mmHg (ref 32–45)

## 2022-05-29 LAB — URINALYSIS, ROUTINE W REFLEX MICROSCOPIC
Bacteria, UA: NONE SEEN
Bilirubin Urine: NEGATIVE
Glucose, UA: NEGATIVE mg/dL
Ketones, ur: NEGATIVE mg/dL
Leukocytes,Ua: NEGATIVE
Nitrite: NEGATIVE
Protein, ur: 30 mg/dL — AB
Specific Gravity, Urine: 1.026 (ref 1.005–1.030)
pH: 5 (ref 5.0–8.0)

## 2022-05-29 LAB — CBC WITH DIFFERENTIAL/PLATELET
Abs Immature Granulocytes: 0.07 10*3/uL (ref 0.00–0.07)
Basophils Absolute: 0 10*3/uL (ref 0.0–0.1)
Basophils Relative: 0 %
Eosinophils Absolute: 0.1 10*3/uL (ref 0.0–0.5)
Eosinophils Relative: 2 %
HCT: 35.2 % — ABNORMAL LOW (ref 36.0–46.0)
Hemoglobin: 11.1 g/dL — ABNORMAL LOW (ref 12.0–15.0)
Immature Granulocytes: 1 %
Lymphocytes Relative: 20 %
Lymphs Abs: 1.3 10*3/uL (ref 0.7–4.0)
MCH: 25.2 pg — ABNORMAL LOW (ref 26.0–34.0)
MCHC: 31.5 g/dL (ref 30.0–36.0)
MCV: 79.8 fL — ABNORMAL LOW (ref 80.0–100.0)
Monocytes Absolute: 0.4 10*3/uL (ref 0.1–1.0)
Monocytes Relative: 7 %
Neutro Abs: 4.5 10*3/uL (ref 1.7–7.7)
Neutrophils Relative %: 70 %
Platelets: 193 10*3/uL (ref 150–400)
RBC: 4.41 MIL/uL (ref 3.87–5.11)
RDW: 13.2 % (ref 11.5–15.5)
WBC: 6.5 10*3/uL (ref 4.0–10.5)
nRBC: 0 % (ref 0.0–0.2)

## 2022-05-29 LAB — BASIC METABOLIC PANEL
Anion gap: 9 (ref 5–15)
BUN: 12 mg/dL (ref 6–20)
CO2: 23 mmol/L (ref 22–32)
Calcium: 9.2 mg/dL (ref 8.9–10.3)
Chloride: 106 mmol/L (ref 98–111)
Creatinine, Ser: 0.6 mg/dL (ref 0.44–1.00)
GFR, Estimated: >60 mL/min (ref >60)
Glucose, Bld: 179 mg/dL — ABNORMAL HIGH (ref 70–99)
Potassium: 4 mmol/L (ref 3.5–5.1)
Sodium: 138 mmol/L (ref 135–145)

## 2022-05-29 LAB — I-STAT CHEM 8, ED
BUN: 11 mg/dL (ref 6–20)
Calcium, Ion: 1.21 mmol/L (ref 1.15–1.40)
Chloride: 103 mmol/L (ref 98–111)
Creatinine, Ser: 0.5 mg/dL (ref 0.44–1.00)
Glucose, Bld: 182 mg/dL — ABNORMAL HIGH (ref 70–99)
HCT: 36 % (ref 36.0–46.0)
Hemoglobin: 12.2 g/dL (ref 12.0–15.0)
Potassium: 3.9 mmol/L (ref 3.5–5.1)
Sodium: 137 mmol/L (ref 135–145)
TCO2: 22 mmol/L (ref 22–32)

## 2022-05-29 LAB — RAPID URINE DRUG SCREEN, HOSP PERFORMED
Amphetamines: NOT DETECTED
Barbiturates: NOT DETECTED
Benzodiazepines: NOT DETECTED
Cocaine: NOT DETECTED
Opiates: POSITIVE — AB
Tetrahydrocannabinol: NOT DETECTED

## 2022-05-29 LAB — SEDIMENTATION RATE: Sed Rate: 38 mm/hr — ABNORMAL HIGH (ref 0–22)

## 2022-05-29 LAB — CK: Total CK: 56 U/L (ref 38–234)

## 2022-05-29 LAB — MAGNESIUM: Magnesium: 1.9 mg/dL (ref 1.7–2.4)

## 2022-05-29 LAB — C-REACTIVE PROTEIN: CRP: 0.6 mg/dL (ref <1.0)

## 2022-05-29 MED ORDER — LACTATED RINGERS IV BOLUS
1000.0000 mL | Freq: Once | INTRAVENOUS | Status: AC
  Administered 2022-05-29: 1000 mL via INTRAVENOUS

## 2022-05-29 MED ORDER — MORPHINE SULFATE (PF) 4 MG/ML IV SOLN
4.0000 mg | Freq: Once | INTRAVENOUS | Status: AC
  Administered 2022-05-29: 4 mg via INTRAVENOUS
  Filled 2022-05-29: qty 1

## 2022-05-29 MED ORDER — TETRACAINE HCL 0.5 % OP SOLN
2.0000 [drp] | Freq: Once | OPHTHALMIC | Status: AC
  Administered 2022-05-29: 2 [drp] via OPHTHALMIC
  Filled 2022-05-29: qty 4

## 2022-05-29 MED ORDER — VALACYCLOVIR HCL 500 MG PO TABS
1000.0000 mg | ORAL_TABLET | Freq: Once | ORAL | Status: AC
  Administered 2022-05-29: 1000 mg via ORAL
  Filled 2022-05-29: qty 2

## 2022-05-29 MED ORDER — VANCOMYCIN HCL 1250 MG/250ML IV SOLN: 1250.0000 mg | INTRAVENOUS | Status: DC

## 2022-05-29 MED ORDER — LEVETIRACETAM IN NACL 1000 MG/100ML IV SOLN
1000.0000 mg | Freq: Once | INTRAVENOUS | Status: AC
  Administered 2022-05-29: 1000 mg via INTRAVENOUS
  Filled 2022-05-29: qty 100

## 2022-05-29 MED ORDER — OXYCODONE HCL 5 MG PO TABS
5.0000 mg | ORAL_TABLET | ORAL | Status: DC | PRN
  Administered 2022-05-29: 5 mg via ORAL
  Filled 2022-05-29: qty 1

## 2022-05-29 MED ORDER — LORAZEPAM 2 MG/ML IJ SOLN
INTRAMUSCULAR | Status: AC
  Filled 2022-05-29: qty 1

## 2022-05-29 MED ORDER — VANCOMYCIN HCL 1750 MG/350ML IV SOLN
1750.0000 mg | Freq: Once | INTRAVENOUS | Status: AC
  Administered 2022-05-29: 1750 mg via INTRAVENOUS
  Filled 2022-05-29: qty 350

## 2022-05-29 MED ORDER — SODIUM CHLORIDE 0.9 % IV SOLN
3.0000 g | Freq: Once | INTRAVENOUS | Status: AC
  Administered 2022-05-29: 3 g via INTRAVENOUS
  Filled 2022-05-29: qty 8

## 2022-05-29 MED ORDER — FLUORESCEIN SODIUM 1 MG OP STRP
1.0000 | ORAL_STRIP | Freq: Once | OPHTHALMIC | Status: AC
  Administered 2022-05-29: 1 via OPHTHALMIC
  Filled 2022-05-29: qty 1

## 2022-05-29 MED ORDER — EPINEPHRINE 0.3 MG/0.3ML IJ SOAJ
INTRAMUSCULAR | Status: AC
  Filled 2022-05-29: qty 0.3

## 2022-05-29 MED ORDER — IOHEXOL 350 MG/ML SOLN
60.0000 mL | Freq: Once | INTRAVENOUS | Status: AC | PRN
  Administered 2022-05-29: 60 mL via INTRAVENOUS

## 2022-05-29 MED ORDER — ONDANSETRON HCL 4 MG/2ML IJ SOLN
4.0000 mg | Freq: Once | INTRAMUSCULAR | Status: AC
  Administered 2022-05-29: 4 mg via INTRAVENOUS
  Filled 2022-05-29: qty 2

## 2022-05-29 NOTE — Plan of Care (Signed)
Briefly discussed with Dr. 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 Mayra Neer has now been narrowed to Keppra monotherapy She reports not sleeping well due to pain which may be lowering her seizure threshold, alternatively she may be having pseudoseizures secondary to pain/stress  If she is otherwise at her baseline, adding back Vimpat 50 mg twice daily (EKG reviewed and does not demonstrate PR prolongation) pending close outpatient follow-up with neurology is reasonable.  Topiramate and lamotrigine have to be titrated more slowly so Vimpat is the easiest choice to add back  If she continues to have further spells or full neurological consultation is needed please do not hesitate to page neurology  [place] [phone]

## 2022-05-29 NOTE — ED Notes (Signed)
RN reviewed discharge instructions with pt. Pt verbalized understanding and had no further questions. VSS upon discharge.  

## 2022-05-29 NOTE — ED Notes (Signed)
Seizure precautions at the bedside. Suction, O2, and pads

## 2022-05-29 NOTE — Progress Notes (Signed)
Pharmacy Antibiotic Note  Sara [name] is a 46 y.o. female admitted on 05/29/2022 with periorbital cellulitis. Pharmacy has been consulted for vancomycin dosing.  Plan: Vancomycin 1750mg  once, then 1250mg  Q24h (eAUC 522.3, Scr 0.8, Vd 0.5) Unasyn and Valtrex per MD Monitor renal function, s/s of infection, follow cultures for pathogen directed therapy  Height: 5\' 2"  (157.5 cm) Weight: 77.1 kg (170 lb) IBW/kg (Calculated) : 50.1  Temp (24hrs), Avg:98.9 F (37.2 C), Min:98.9 F (37.2 C), Max:98.9 F (37.2 C)  Recent Labs  Lab 05/29/22 1435 05/29/22 1443  WBC 6.5  --   CREATININE 0.60 0.50    Estimated Creatinine Clearance: 84.5 mL/min (by C-G formula based on SCr of 0.5 mg/dL).    Allergies  Allergen Reactions   Other Hives, Itching and Rash    Coban, patient states that skin turn red and has serve itching  Coban, patient states that skin turn red and has serve itching  Coban, patient states that skin turn red and has serve itching  Coban, patient states that skin turn red and has serve itching     Coconut (Cocos Nucifera) Itching and Swelling    Coconut,  Facial swelling   Coconut Fatty Acids Itching   Latex Itching and Rash    (Elastic in underwear)   Tape Rash    Antimicrobials this admission: Valtrex 10/13 >>  Unasyn 10/13 >>  Vancomycin 10/13 >>  Dose adjustments this admission: N/a  Microbiology results: 10/13 BCx: Collected  Thank you for allowing pharmacy to be a part of this patient's care.  Titus Dubin, PharmD PGY1 Pharmacy Resident 05/29/2022 4:24 PM

## 2022-05-29 NOTE — ED Notes (Signed)
Pt has skin breakdown on her right foot.   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 Pt states eye is painful

## 2022-05-29 NOTE — ED Provider Notes (Signed)
[facility] EMERGENCY DEPARTMENT Provider Note   CSN: [id number] Arrival date & time: 05/29/22  1230     History  Chief Complaint  Patient presents with   Facial Swelling    Sara [name] is a 46 y.o. female.  With PMH of DM1, epilepsy, Ehlers-Danlos, blindness of left eye who presents with 2 days of worsening left eye swelling and pain.  Patient said she was seen yesterday at an urgent care where she was given antibiotics for a mild infection around her face which she did not start taking and then when she woke up today she had significant increased swelling of the left eye with pain with eye movements.  She has no change in vision because at baseline she is blind in the left eye with very blurred vision.  She developed a rash as well surrounding the eye.  She did not have any trauma to the eye or scratch to the eye or injury.  While she was in the waiting room today, she did have a seizure which is known due to her underlying epilepsy.  According to her daughter she has seizures when she has increased increased stress or infection.  She has had no fevers, no vomiting, no cough, congestion or rhinorrhea.  No ear pain or change in hearing.  No facial droop.  She endorses some numbness of her right face.  She also has a chronic right foot wound from previous injury being in a boot for prolonged extended period of time.  She is currently applying topical medication to it but is unsure what it is.  HPI     Home Medications Prior to Admission medications   Medication Sig Start Date End Date Taking? Authorizing Provider  blood glucose meter kit and supplies by Other route once for 1 dose. Use as instructed 04/21/22  Yes [provider]  albuterol (PROVENTIL HFA;VENTOLIN HFA) 108 (90 Base) MCG/ACT inhaler Inhale 2 to 3 puffs into the lungs every 6 hours as needed (respiratory problems)    [provider]  ALPRAZolam Duanne Moron) 1 MG tablet Take by mouth.     [provider]  amLODipine (NORVASC) 10 MG tablet Take 1 tablet by mouth daily. 09/09/21 09/09/22  [provider]  betamethasone dipropionate 0.05 % cream Apply topically 2 (two) times daily. 05/21/22   McDonald, Stephan Minister, DPM  Buprenorphine HCl-Naloxone HCl 8-2 MG FILM Place under the tongue 2 (two) times daily. 03/17/22   [provider]  Calcium Carbonate-Simethicone (MAALOX MAX) 1000-60 MG CHEW Chew by mouth. 03/17/22   [provider]  Cholecalciferol (VITAMIN D3) 50 MCG (2000 UT) CAPS Take by mouth daily.    [provider]  Cyanocobalamin (VITAMIN B-12 IJ) Inject as directed every 30 (thirty) days.    [provider]  cyclobenzaprine (FLEXERIL) 10 MG tablet Take 1 tablet (10 mg total) by mouth 3 (three) times daily as needed for muscle spasms. 03/29/22   Montine Circle, PA-C  cyclobenzaprine (FLEXERIL) 10 MG tablet TAKE 1 TABLET BY MOUTH NIGHTLY AS NEEDED FOR REST 01/13/22   [provider]  diclofenac (VOLTAREN) 75 MG EC tablet     [provider]  dicyclomine (BENTYL) 10 MG capsule Take 10 mg by mouth every 6 (six) hours. 03/18/22   [provider]  donepezil (ARICEPT) 10 MG tablet Take by mouth.    [provider]  doxycycline (VIBRAMYCIN) 100 MG capsule Take 100 mg by mouth 2 (two) times daily. 05/28/22   [provider]  famotidine (PEPCID) 40 MG tablet SMARTSIG:1 Tablet(s) By Mouth Every Evening 05/07/22   [provider]  FERROUS SULFATE PO Take by mouth daily.    [provider]  furosemide (LASIX) 40 MG tablet Take 40 mg by mouth daily. 11/04/21   [provider]  gabapentin (NEURONTIN) 100 MG capsule Take 100 mg by mouth 3 (three) times daily.    [provider]  gabapentin (NEURONTIN) 600 MG tablet Take by mouth.    [provider]  glucagon (GLUCAGEN) 1 MG SOLR injection Inject 1 mg into the vein once as needed for low blood sugar.    [provider]  hydrochlorothiazide (HYDRODIURIL) 25 MG tablet Take 1 tablet by mouth daily. 11/04/21 11/04/22  [provider]  ibuprofen (ADVIL) 800 MG tablet Take 800 mg by mouth every 8 (eight) hours as needed. 01/13/22   [provider]  insulin aspart (NOVOLOG) 100 UNIT/ML injection For insulin pump - administration average 160u over course of day    [provider]  insulin detemir (LEVEMIR) 100 UNIT/ML injection Inject 40 Units into the skin 2 (two) times daily. (USE ONLY IF INSULIN PUMP FAILS)    [provider]  Insulin Glargine (TOUJEO SOLOSTAR Riley) Inject 34 Units into the skin at bedtime.    [provider]  insulin lispro (HUMALOG) 100 UNIT/ML injection Inject into the skin 3 (three) times daily before meals. Sliding scale    [provider]  Lacosamide (VIMPAT) 150 MG TABS     [provider]  lamoTRIgine (LAMICTAL) 100 MG tablet Take 1 tablet (100 mg total) by mouth 2 (two) times daily. Patient taking differently: Take 250 mg by mouth 2 (two) times daily.  03/13/18 07/09/20  Saundra Shelling, MD  lamoTRIgine (LAMICTAL) 100 MG tablet  04/26/20   [provider]  levETIRAcetam (KEPPRA) 750 MG tablet Take 750 mg by mouth 2 (two) times daily.    [provider]  levETIRAcetam (KEPPRA) 750 MG tablet Take by mouth. 01/13/22 01/13/23  [provider]  lisinopril (ZESTRIL) 10 MG tablet Take by mouth.    [provider]  loperamide (IMODIUM) 2 MG capsule Take by mouth. 03/18/22   [provider]  losartan (COZAAR) 25 MG tablet Take 25 mg by mouth daily.    [provider]  losartan (COZAAR) 50 MG tablet Take by mouth. 09/20/19 11/20/22  [provider]  methocarbamol (ROBAXIN) 750 MG tablet Take 1 tablet by mouth every 8 (eight) hours.    [provider]  montelukast (SINGULAIR) 10 MG tablet Take 10 mg by mouth at bedtime.    [provider]  Multiple  Vitamins-Minerals (B COMPLEX-C-E-ZINC) tablet Take 1 tablet by mouth daily. 08/04/18   [provider]  Oxycodone HCl 10 MG TABS Take 10 mg by mouth 3 (three) times daily. 05/07/22   [provider]  pregabalin (LYRICA) 100 MG capsule Take by mouth. 08/19/21 08/19/22  [provider]  prochlorperazine (COMPAZINE) 5 MG tablet Take by mouth. 03/18/22   [provider]  ropivacaine, PF, 5 mg/mL, 0.5%, (NAROPIN) 5 MG/ML injection Inject into the articular space. 07/14/21   [provider]  rosuvastatin (CRESTOR) 20 MG tablet Take 20 mg by mouth daily.    [provider]  Semaglutide,0.25 or 0.5MG /DOS, (OZEMPIC, 0.25 OR 0.5 MG/DOSE,) 2 MG/3ML SOPN INJECT 0.25 MG SUBCUTANEOUSLY  WEEKLY FOR 4 WEEKS, THEN 0.5 MG  WEEKLY THEREAFTER 02/16/22   [provider]  Bethel Born  8.6-50 MG tablet Take 2 tablets by mouth daily. 03/17/22   [provider]  senna (SENOKOT) 8.6 MG tablet Take 1 tablet by mouth daily.    [provider]  topiramate (TOPAMAX) 100 MG tablet Take 1 tablet (100 mg total) by mouth 4 (four) times daily. Patient taking differently: Take 100 mg by mouth 2 (two) times daily.  03/13/18 07/09/20  Saundra Shelling, MD  topiramate (TOPAMAX) 100 MG tablet     [provider]      Allergies    Other, Coconut (cocos nucifera), Coconut fatty acids, Latex, and Tape    Review of Systems   Review of Systems  Physical Exam Updated Vital Signs BP (!) 144/96   Pulse (!) 107   Temp 98.9 F (37.2 C) (Oral)   Resp 14   Ht $R'5\' 2"'CF$  (1.575 m)   Wt 77.1 kg   SpO2 100%   BMI 31.09 kg/m  Physical Exam Eye exam summary:  Right Left   LLL(lids, lahses,lacrimals) No lesions Left periorbital edema and erythema with fluctuant tender region at left  lateral zygomatic region with erythematous papular rash of the forehead crease between both eyes  CS(conjunctiva, sclera) White and quiet White and quiet  K(corneaA) No fluorescein uptake  No fluorescein uptake, no dendrites or pseudodendrites  Pupils/Iris Round and reactive Round and reactive  L(lens) Clear Clear  Acuity  Hands/objects  IOP  22/24  EOM Intact with pain of left EOM  Retina NA  Constitutional: Alert and oriented.  Uncomfortable but nontoxic ENT      Head: Normocephalic and atraumatic.  Erythematous maculopapular rash around the forehead region between both eyes, see media      Nose: No congestion.      Mouth/Throat: Mucous membranes are moist.      Neck: No stridor. Cardiovascular: S1, S2, tachycardic, regular rhythm, palpable dp pulses Respiratory: Normal respiratory effort. Breath sounds are normal. Gastrointestinal: Soft and nontender.  Musculoskeletal: Normal range of motion in all extremities.      Right lower leg: Tender right dorsal foot ulcerated lesions with mild surrounding erythema, no active discharge, see media      Left lower leg: No tenderness or edema. Neurologic: Initially postictal but improved over time.  EOMI.  PERRL.  No facial droop.  Reported decrease sensation on right side of face.  Moving all extremities equally. Skin: Skin is warm, dry.  Right foot lesions as described under musculoskeletal and seen in media.  There is left periorbital swelling and edema with fluctuance of the left zygomatic region. Psychiatric: Mood and affect are normal. Speech and behavior are normal.        ED Results / Procedures / Treatments   Labs (all labs ordered are listed, but only abnormal results are displayed) Labs Reviewed  CBC WITH DIFFERENTIAL/PLATELET - Abnormal; Notable for the following components:      Result Value   Hemoglobin 11.1 (*)    HCT 35.2 (*)    MCV 79.8 (*)    MCH 25.2 (*)    All other components within normal limits  I-STAT VENOUS BLOOD GAS, ED - Abnormal; Notable for the following components:   pCO2, Ven 35.4 (*)    HCT 35.0 (*)    Hemoglobin 11.9 (*)    All other components within normal limits  I-STAT CHEM 8, ED  - Abnormal; Notable for the following components:   Glucose, Bld 182 (*)    All other components within normal limits  CULTURE, BLOOD (ROUTINE  X 2)  CULTURE, BLOOD (ROUTINE X 2)  BASIC METABOLIC PANEL  SEDIMENTATION RATE  C-REACTIVE PROTEIN    EKG EKG Interpretation  Date/Time:  Friday May 29 2022 13:22:22 EDT Ventricular Rate:  116 PR Interval:  126 QRS Duration: 89 QT Interval:  340 QTC Calculation: 473 R Axis:   118 Text Interpretation: Right and left arm electrode reversal, interpretation assumes no reversal Sinus tachycardia Right axis deviation Borderline repolarization abnormality Confirmed by Georgina Snell [phone]) on 05/29/2022 1:37:58 PM  Radiology DG Foot Complete Right  Result Date: 05/29/2022 CLINICAL DATA:  Infection.  Skin breakdown of right foot. EXAM: RIGHT FOOT COMPLETE - 3+ VIEW COMPARISON:  Right foot radiographs 04/30/2022 FINDINGS: Severe second and moderate third tarsometatarsal joint space narrowing. Mild moderate third tarsometatarsal lateral subchondral degenerative cystic changes. Mild fourth and fifth tarsometatarsal joint space narrowing. Moderate dorsal tarsometatarsal degenerative osteophytes on lateral view. Moderate plantar calcaneal heel spur. Mild chronic enthesopathic change at the Achilles insertion on the calcaneus. No acute fracture or dislocation. No cortical erosion is seen to indicate radiographic evidence of acute osteomyelitis. IMPRESSION: 1. 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 2. No radiographic evidence of acute osteomyelitis. Electronically Signed   By: Yvonne Kendall M.D.   On: 05/29/2022 15:05    Procedures Procedures  Remain on constant cardiac monitoring, mainly sinus tachycardia.  Medications Ordered in ED Medications  valACYclovir (VALTREX) tablet 1,000 mg (has no administration in time range)  morphine (PF) 4 MG/ML injection 4 mg (4 mg Intravenous Given 05/29/22 1358)  ondansetron (ZOFRAN)  injection 4 mg (4 mg Intravenous Given 05/29/22 1400)  Ampicillin-Sulbactam (UNASYN) 3 g in sodium chloride 0.9 % 100 mL IVPB (3 g Intravenous New Bag/Given 05/29/22 1434)  fluorescein ophthalmic strip 1 strip (1 strip Left Eye Given 05/29/22 1403)  tetracaine (PONTOCAINE) 0.5 % ophthalmic solution 2 drop (2 drops Left Eye Given 05/29/22 1403)  levETIRAcetam (KEPPRA) IVPB 1000 mg/100 mL premix (0 mg Intravenous Stopped 05/29/22 1429)    ED Course/ Medical Decision Making/ A&P                           Medical Decision Making Vyctoria Dickman is a 46 y.o. female.  With PMH of DM1, epilepsy, Ehlers-Danlos, blindness of left eye who presents with 2 days of worsening left eye swelling and pain.  Patient's exam is concerning for periorbital versus orbital cellulitis.  I considered herpes ophthalmicus and superimposed herpes infection although there were no lesions across the midline and there are no pseudodendrites or dendrites on exam but with the abnormal associated facial rash, cover patient broadly with IV vancomycin, Unasyn and p.o. Valtrex.  Additionally, regarding her seizure, suspect possible breakthrough seizure in the setting of infection.  I covered her with IV Keppra load.  She is back at mental baseline.  There are no focal deficits but will obtain CT head with contrast to ensure there is no other deep space infection associated with left periorbital infection and CT face to further evaluate for deep space infection.  She has chronic right foot wound which can be seen in media.  Unclear if this was a breakdown wound with superimposed infection possible fungal or bacterial.  Also consider possible rheumatologic disease.  Foot is neurovascularly intact, unlikely vascular etiology.  Her white blood cell count was normal 6.5.  No left shift.  Mild anemia hemoglobin 11.1.  Glucose 182 normal creatinine 0.5 on istat.  We will continue to be covered  with IV antibiotics.  Likely plan for  admission.  Signed out to oncoming Dr Mayra Neer pending remainder of labs and CT scan results..  Amount and/or Complexity of Data Reviewed Labs: ordered. Radiology: ordered.  Risk Prescription drug management.    Final Clinical Impression(s) / ED Diagnoses Final diagnoses:  Seizure [facility])    Rx / DC Orders ED Discharge Orders     None         Elgie Congo, MD 05/29/22 1511

## 2022-05-29 NOTE — ED Provider Notes (Signed)
3:02 PM Assumed care of patient from off-going team. For more details, please see note from same day.  In brief, this is a 46 y.o. female T1DM, Ehlers-Danlos type III, migraines, seizures, asthma, and GERD who p/w worsening periorbital edema, pain w/ eye movement, seizure in lobby.  Plan/Dispo at time of sign-out & ED Course since sign-out: [ ]  CTH w/o contrast, CT face w/ contrast to eval for preseptal vs orbital cellulitis vs deep space infection S/p vanc, unasyn, & PO valtrex.  Ocular exam did not demonstrate concern for herpes ophthalmicus.   BP (!) 144/96   Pulse (!) 107   Temp 98.9 F (37.2 C) (Oral)   Resp 14   Ht 5\' 2"  (1.575 m)   Wt 77.1 kg   SpO2 100%   BMI 31.09 kg/m    ED Course:   Clinical Course as of 05/29/22 2226  Fri May 29, 2022  1536 Patient had a generalized tonic clonic seizure in CT scanner, approx 3-4 minutes, terminated on its own. Now post-ictal, moving all four extremities, EOMI. Already received 1000 mg IV keppra. Daughter at bedside states her last seizure before today was 2 weeks ago, and before that it was months and months ago. Hasn't missed any AED doses. [HN]  1539 DG Foot Complete Right 1. 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 2. No radiographic evidence of acute osteomyelitis.   [HN]  9357 D/w neurology, who states likely breakthrough seizure in s/o infection. 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 Daughter at bedside notes that these happen more frequently when her mother is "stressed out." Neuro recommending UA/UDS, Mg, and CK to round out workup. Not advising any additional seizure meds at this time. Will d/w family. [HN]  1705 Pulse Rate(!): 103 Pulse from chart review seems to be normally ~100 bpm. [HN]  1811 Magnesium: 1.9 [HN]  1811 CK Total: 56 No rhabdo [HN]  2011 Patient's lab w/u reassuring. Patient is reevaluated, states she still has a lot of pain in her face and  foot. States she has only been taking Keppra, no other seizure medications. Last dose was this AM, has not missed any doses.  [HN]  2111 Neurology recommended restarting vimpat [HN]  2158 Discussed with fiance and patient. Discussed restarting vimpat and the fiance maintains that the seizures today were "definitely due to stress."  He states that they are not interested in restarting the Vimpat because they "do not want to go backwards," and that the "Keppra has been working very well for her."  I reiterated that this was the recommendation by neurology but he states that she already follows with a neurologist and they can get recommendations from them. Patient is very frustrated that nobody can explain to her what is wrong with her foot.  She states that she has an appointment with a skin doctor on 19 October, and I relayed that this is the correct individual to see, as well as to follow-up with the rheumatologist she was referred to. [HN]    Clinical Course User Index [HN] Audley Hose, MD   Patient states she would like to be discharged at this time.  Will have follow-up with neurology, rheumatology, dermatology.  Instructed to start taking her doxycycline that was prescribed to her by urgent care, twice daily x7 days.  Ellene Route states that they will pick this up at the pharmacy tomorrow morning.  Patient is already covered with antibiotics for today.  Discharge into the care of her  fianc.  Dispo: DC w/ discharge instructions/return precautions ------------------------------- Cindee Lame, MD Emergency Medicine  This note was created using dictation software, which may contain spelling or grammatical errors.   Audley Hose, MD 05/29/22 2229

## 2022-05-29 NOTE — ED Notes (Signed)
Pt brought back from lobby d/t seizure. Pt seizure lasted for about 6 minutes. Pt was gazing and posturing arms inward.   Pt daughter states pt has hx of epilepsy.

## 2022-05-29 NOTE — ED Notes (Signed)
Pt stated that her eye is still hurting with movement and touch. Rn applied gauze over eye to help pt from straining and moving. EDP  aware

## 2022-05-29 NOTE — ED Notes (Signed)
Pt started seizing at [facility]. Pt transported back to room. EDP, NT, and RN at the bedside

## 2022-05-29 NOTE — Telephone Encounter (Signed)
Sandy from [facility] called and they got the referral but need a demographic form sent to them with address and number.The fax # is [phone]. I will fax it.

## 2022-05-29 NOTE — Telephone Encounter (Signed)
Refaxed the referral w/ demographics to [facility].

## 2022-05-29 NOTE — Discharge Instructions (Signed)
Thank you for coming to [facility] Emergency Department. You were seen for pain of the left face and left eye area. We did an exam, labs, and imaging, and these showed an infected sebaceous (epidermoid) cyst in your left cheek.  You received antibiotics while here in the emergency department and for treatment at home please take the doxycycline that was prescribed to you by urgent care.  This will be twice per day for 7 days.  You had seizures while you were here and neurology was consulted, they recommended restarting your Vimpat but you stated that you would rather continue taking just your Keppra.  Please follow-up with your neurologist in the next couple of weeks. You also stated that you follow-up with a dermatologist on 1019, please go to this appointment, as they will further investigate the rash on your foot and potentially can discuss the sebaceous cyst in your face.  Additionally, you were referred to rheumatology for the rash on your foot, please follow-up with them as originally scheduled.   Do not hesitate to return to the ED or call 911 if you experience: -Worsening symptoms -Lightheadedness, passing out -Fevers/chills -Anything else that concerns you

## 2022-06-01 LAB — LAMOTRIGINE LEVEL: Lamotrigine Lvl: <1 ug/mL — ABNORMAL LOW (ref 2.0–20.0)

## 2022-06-01 LAB — TOPIRAMATE LEVEL: Topiramate Lvl: <1.5 ug/mL — ABNORMAL LOW (ref 2.0–25.0)

## 2022-06-02 LAB — LACOSAMIDE: Lacosamide: <0.5 ug/mL — ABNORMAL LOW (ref 5.0–10.0)

## 2022-06-03 LAB — CULTURE, BLOOD (ROUTINE X 2)
Culture: NO GROWTH
Culture: NO GROWTH
Special Requests: ADEQUATE
Special Requests: ADEQUATE

## 2022-06-11 ENCOUNTER — Ambulatory Visit: Payer: Self-pay | Admitting: Podiatry

## 2022-06-16 ENCOUNTER — Other Ambulatory Visit: Payer: Self-pay | Admitting: Specialist

## 2022-06-16 DIAGNOSIS — Z8673 Personal history of transient ischemic attack (TIA), and cerebral infarction without residual deficits: Secondary | ICD-10-CM

## 2022-06-16 DIAGNOSIS — G40209 Localization-related (focal) (partial) symptomatic epilepsy and epileptic syndromes with complex partial seizures, not intractable, without status epilepticus: Secondary | ICD-10-CM

## 2022-06-16 DIAGNOSIS — G40409 Other generalized epilepsy and epileptic syndromes, not intractable, without status epilepticus: Secondary | ICD-10-CM

## 2022-07-20 ENCOUNTER — Ambulatory Visit
Admission: RE | Admit: 2022-07-20 | Discharge: 2022-07-20 | Disposition: A | Discharge status: Home / Self Care | Payer: Medicare Other | Source: Ambulatory Visit | Attending: Specialist | Admitting: Specialist

## 2022-07-20 DIAGNOSIS — G40209 Localization-related (focal) (partial) symptomatic epilepsy and epileptic syndromes with complex partial seizures, not intractable, without status epilepticus: Secondary | ICD-10-CM

## 2022-07-20 DIAGNOSIS — Z8673 Personal history of transient ischemic attack (TIA), and cerebral infarction without residual deficits: Secondary | ICD-10-CM

## 2022-07-20 DIAGNOSIS — G40409 Other generalized epilepsy and epileptic syndromes, not intractable, without status epilepticus: Secondary | ICD-10-CM

## 2022-07-20 MED ORDER — GADOPICLENOL 0.5 MMOL/ML IV SOLN
7.5000 mL | Freq: Once | INTRAVENOUS | Status: AC | PRN
  Administered 2022-07-20: 7.5 mL via INTRAVENOUS

## 2022-07-20 MED ORDER — GADOPICLENOL 0.5 MMOL/ML IV SOLN: 7.5000 mL | Freq: Once | INTRAVENOUS | Status: DC | PRN

## 2022-07-29 ENCOUNTER — Telehealth: Payer: Self-pay | Admitting: *Deleted

## 2022-07-29 NOTE — Telephone Encounter (Signed)
[facility] is calling for a medication refill( bethametasone dipropionate,0.05% cream )requested by patient. They will refax request to be signed by physician.Marland Kitchen

## 2022-08-06 ENCOUNTER — Telehealth: Payer: Self-pay | Admitting: Podiatry

## 2022-08-06 NOTE — Telephone Encounter (Signed)
Would like to speak with Dr. Lilian Kapur regarding patient care. Please call and if not answer, please leave a callback number that he can reach you back.

## 2022-08-06 NOTE — Telephone Encounter (Signed)
Spoke to the patient and the patient states that her PCP Dr. Golden Pop is wanting you to call him for an update on the patients care plan.

## 2022-08-18 ENCOUNTER — Ambulatory Visit (INDEPENDENT_AMBULATORY_CARE_PROVIDER_SITE_OTHER): Payer: Medicare Other | Admitting: Podiatry

## 2022-08-18 ENCOUNTER — Ambulatory Visit (INDEPENDENT_AMBULATORY_CARE_PROVIDER_SITE_OTHER): Payer: Medicare Other

## 2022-08-18 DIAGNOSIS — E11610 Type 2 diabetes mellitus with diabetic neuropathic arthropathy: Secondary | ICD-10-CM | POA: Diagnosis not present

## 2022-08-18 DIAGNOSIS — M14671 Charcot's joint, right ankle and foot: Secondary | ICD-10-CM | POA: Diagnosis not present

## 2022-08-18 NOTE — Progress Notes (Signed)
  Subjective:  Patient ID: Sara [name], female    DOB: 09-28-1975,  MRN: [id number]  Chief Complaint  Patient presents with   Rash    Dermatitis of right foot - she thinks her boot is causing it    47 y.o. female presents with the above complaint. History confirmed with patient.  She did receive her CROW AFO and has been wearing it.  She feels like it is uncomfortable.  She is worried that the foot is getting worse as far as the skin goes  Objective:  Physical Exam: warm, good capillary refill, no trophic changes or ulcerative lesions, normal DP and PT pulses, normal sensory exam, and dermatitis improved since last visit with me, has pain and tenderness over the lateral midtarsal joint, no ulceration preulcerative callus or gross deformity foot is plantigrade   Radiographs taken today show arthrosis of the lateral fourth and fifth tarsometatarsal joints, stable alignment no collapse of the midtarsal arch  Assessment:   1. Charcot foot due to diabetes mellitus ([place])      Plan:  Patient was evaluated and treated and all questions answered.  We reviewed her x-rays.  I discussed with her that given the stability of the foot and lack of impending ulceration or collapse and the fact that the foot is plantigrade I would recommend she continue to be treated nonoperatively using the AFO.  I do not think surgery is likely to offer much benefit other than reduction of pain is a possibility but would carry the risk of both bone healing and wound healing complications.  I discussed with her that it may be worth having a second opinion with an orthopedist at [facility] that is more familiar with Charcot reconstruction.  Regarding her skin she will schedule follow-up with the dermatologist at [facility] as well  Return in about 4 months (around 12/17/2022) for R foot Charcot f/u (new xrays).

## 2022-10-09 ENCOUNTER — Other Ambulatory Visit: Payer: Self-pay | Admitting: Podiatry

## 2022-10-28 ENCOUNTER — Ambulatory Visit (INDEPENDENT_AMBULATORY_CARE_PROVIDER_SITE_OTHER): Payer: Medicare Other | Admitting: Podiatry

## 2022-10-28 DIAGNOSIS — S90822A Blister (nonthermal), left foot, initial encounter: Secondary | ICD-10-CM | POA: Diagnosis not present

## 2022-10-28 MED ORDER — CEPHALEXIN 500 MG PO CAPS: 500.0000 mg | ORAL_CAPSULE | Freq: Three times a day (TID) | ORAL | 0 refills | Status: DC

## 2022-10-28 NOTE — Progress Notes (Signed)
  Subjective:  Patient ID: Sara [name], female    DOB: 1975/12/15,  MRN: [id number]  Chief Complaint  Patient presents with   lesion    bottom of left foot close to heel is a red spot that has come up - looked like a blood blister (she has a picture of it). It drained when she got out of the shower this morning - concerning to her because she is diabetic    47 y.o. female presents with the above complaint. History confirmed with patient.   Objective:  Physical Exam: warm, good capillary refill, no trophic changes or ulcerative lesions, normal DP and PT pulses, abnormal sensory exam, there is a blister on the plantar left heel no evidence of ulceration or puncture wound, slight tenderness here.  Assessment:   1. Blister of left heel, initial encounter      Plan:  Patient was evaluated and treated and all questions answered.  She had a large blood blister present on the plantar left heel, appeared to be friction in shoe gear related which I discussed with her to offload this.  Recommended painting with Betadine, it ruptured this morning, she does have a picture of what it looks like when it was filled with blood.  Utilize Betadine daily.  I did place her on cephalexin in order to reduce the risk of infection of the area due to her diabetes.  I will see her back in 3 weeks for follow-up hopefully should be resolved at that point.  Return in about 3 weeks (around 11/18/2022) for f/u on bilster left heel (if still hurting take xrays).

## 2022-11-07 ENCOUNTER — Other Ambulatory Visit: Payer: Self-pay | Admitting: Podiatry

## 2022-11-19 ENCOUNTER — Ambulatory Visit (INDEPENDENT_AMBULATORY_CARE_PROVIDER_SITE_OTHER): Payer: Medicare Other | Admitting: Podiatry

## 2022-11-19 DIAGNOSIS — B351 Tinea unguium: Secondary | ICD-10-CM

## 2022-11-23 NOTE — Progress Notes (Signed)
  Subjective:  Patient ID: Sara [name], female    DOB: 1976-08-04,  MRN: [id number]  Chief Complaint  Patient presents with   Blister    3wk for f/u on bilster left heel (if still hurting take xrays).    47 y.o. female presents with the above complaint. History confirmed with patient.  The blister is doing fine.  She notes discoloration and thickening in the nail  Objective:  Physical Exam: warm, good capillary refill, no trophic changes or ulcerative lesions, normal DP and PT pulses, abnormal sensory exam, and ulcer healing well there is dry skin, there is thickening of the hallux nail plate with discoloration.  Assessment:   1. Onychomycosis      Plan:  Patient was evaluated and treated and all questions answered.  Blister is healed can leave open to air and apply moisturizing lotion daily.  Nail plate has thickening and discoloration suspect likely onychomycosis or nail dystrophy.  Culture of the nail plate was taken and sent to lab for evaluation I will let her know what this shows for further treatment.  No follow-ups on file.

## 2022-11-25 ENCOUNTER — Emergency Department (HOSPITAL_COMMUNITY)
Admission: EM | Admit: 2022-11-25 | Discharge: 2022-11-26 | Disposition: A | Discharge status: Home / Self Care | Payer: Medicare Other | Source: Home / Self Care | Attending: Emergency Medicine | Admitting: Emergency Medicine

## 2022-11-25 ENCOUNTER — Encounter (HOSPITAL_COMMUNITY): Payer: Self-pay

## 2022-11-25 ENCOUNTER — Emergency Department (HOSPITAL_COMMUNITY): Payer: Medicare Other

## 2022-11-25 ENCOUNTER — Other Ambulatory Visit: Payer: Self-pay

## 2022-11-25 DIAGNOSIS — Z794 Long term (current) use of insulin: Secondary | ICD-10-CM | POA: Insufficient documentation

## 2022-11-25 DIAGNOSIS — R112 Nausea with vomiting, unspecified: Secondary | ICD-10-CM

## 2022-11-25 DIAGNOSIS — R1031 Right lower quadrant pain: Secondary | ICD-10-CM

## 2022-11-25 DIAGNOSIS — E1065 Type 1 diabetes mellitus with hyperglycemia: Secondary | ICD-10-CM | POA: Insufficient documentation

## 2022-11-25 DIAGNOSIS — Z91040 Latex allergy status: Secondary | ICD-10-CM | POA: Insufficient documentation

## 2022-11-25 DIAGNOSIS — F4489 Other dissociative and conversion disorders: Secondary | ICD-10-CM | POA: Diagnosis not present

## 2022-11-25 DIAGNOSIS — G40909 Epilepsy, unspecified, not intractable, without status epilepticus: Secondary | ICD-10-CM | POA: Insufficient documentation

## 2022-11-25 DIAGNOSIS — Z79899 Other long term (current) drug therapy: Secondary | ICD-10-CM | POA: Insufficient documentation

## 2022-11-25 DIAGNOSIS — R569 Unspecified convulsions: Secondary | ICD-10-CM | POA: Diagnosis not present

## 2022-11-25 DIAGNOSIS — R000 Tachycardia, unspecified: Secondary | ICD-10-CM | POA: Insufficient documentation

## 2022-11-25 DIAGNOSIS — N83202 Unspecified ovarian cyst, left side: Secondary | ICD-10-CM

## 2022-11-25 LAB — CBC WITH DIFFERENTIAL/PLATELET
Abs Immature Granulocytes: 0.03 10*3/uL (ref 0.00–0.07)
Basophils Absolute: 0 10*3/uL (ref 0.0–0.1)
Basophils Relative: 0 %
Eosinophils Absolute: 0 10*3/uL (ref 0.0–0.5)
Eosinophils Relative: 0 %
HCT: 41 % (ref 36.0–46.0)
Hemoglobin: 12.5 g/dL (ref 12.0–15.0)
Immature Granulocytes: 0 %
Lymphocytes Relative: 12 %
Lymphs Abs: 1.2 10*3/uL (ref 0.7–4.0)
MCH: 24.3 pg — ABNORMAL LOW (ref 26.0–34.0)
MCHC: 30.5 g/dL (ref 30.0–36.0)
MCV: 79.8 fL — ABNORMAL LOW (ref 80.0–100.0)
Monocytes Absolute: 0.5 10*3/uL (ref 0.1–1.0)
Monocytes Relative: 5 %
Neutro Abs: 8.3 10*3/uL — ABNORMAL HIGH (ref 1.7–7.7)
Neutrophils Relative %: 83 %
Platelets: 270 10*3/uL (ref 150–400)
RBC: 5.14 MIL/uL — ABNORMAL HIGH (ref 3.87–5.11)
RDW: 13.6 % (ref 11.5–15.5)
WBC: 10.1 10*3/uL (ref 4.0–10.5)
nRBC: 0 % (ref 0.0–0.2)

## 2022-11-25 LAB — HCG, SERUM, QUALITATIVE: Preg, Serum: NEGATIVE

## 2022-11-25 LAB — COMPREHENSIVE METABOLIC PANEL
ALT: 20 U/L (ref 0–44)
AST: 23 U/L (ref 15–41)
Albumin: 4.1 g/dL (ref 3.5–5.0)
Alkaline Phosphatase: 90 U/L (ref 38–126)
Anion gap: 13 (ref 5–15)
BUN: 16 mg/dL (ref 6–20)
CO2: 24 mmol/L (ref 22–32)
Calcium: 9.1 mg/dL (ref 8.9–10.3)
Chloride: 97 mmol/L — ABNORMAL LOW (ref 98–111)
Creatinine, Ser: 1 mg/dL (ref 0.44–1.00)
GFR, Estimated: >60 mL/min (ref >60)
Glucose, Bld: 288 mg/dL — ABNORMAL HIGH (ref 70–99)
Potassium: 3.6 mmol/L (ref 3.5–5.1)
Sodium: 134 mmol/L — ABNORMAL LOW (ref 135–145)
Total Bilirubin: 0.9 mg/dL (ref 0.3–1.2)
Total Protein: 8.4 g/dL — ABNORMAL HIGH (ref 6.5–8.1)

## 2022-11-25 LAB — LIPASE, BLOOD: Lipase: 67 U/L — ABNORMAL HIGH (ref 11–51)

## 2022-11-25 LAB — CBG MONITORING, ED: Glucose-Capillary: 257 mg/dL — ABNORMAL HIGH (ref 70–99)

## 2022-11-25 LAB — ETHANOL: Alcohol, Ethyl (B): <10 mg/dL (ref <10)

## 2022-11-25 MED ORDER — LORAZEPAM 2 MG/ML IJ SOLN
1.0000 mg | Freq: Once | INTRAMUSCULAR | Status: AC
  Administered 2022-11-25: 1 mg via INTRAVENOUS
  Filled 2022-11-25: qty 1

## 2022-11-25 MED ORDER — IOHEXOL 300 MG/ML  SOLN
100.0000 mL | Freq: Once | INTRAMUSCULAR | Status: AC | PRN
  Administered 2022-11-25: 100 mL via INTRAVENOUS

## 2022-11-25 MED ORDER — SODIUM CHLORIDE 0.9 % IV SOLN
2000.0000 mg | Freq: Once | INTRAVENOUS | Status: AC
  Administered 2022-11-25: 2000 mg via INTRAVENOUS
  Filled 2022-11-25: qty 20

## 2022-11-25 MED ORDER — MIDAZOLAM HCL 2 MG/2ML IJ SOLN
4.0000 mg | INTRAMUSCULAR | Status: AC
  Administered 2022-11-25: 4 mg via INTRAVENOUS
  Filled 2022-11-25: qty 4

## 2022-11-25 MED ORDER — LORAZEPAM 2 MG/ML IJ SOLN
INTRAMUSCULAR | Status: AC
  Administered 2022-11-25: 1 mg via INTRAMUSCULAR
  Filled 2022-11-25: qty 1

## 2022-11-25 MED ORDER — LACTATED RINGERS IV BOLUS
1000.0000 mL | Freq: Once | INTRAVENOUS | Status: AC
  Administered 2022-11-25: 1000 mL via INTRAVENOUS

## 2022-11-25 MED ORDER — LORAZEPAM 2 MG/ML IJ SOLN: 1.0000 mg | Freq: Once | INTRAMUSCULAR | Status: AC

## 2022-11-25 MED ORDER — ONDANSETRON HCL 4 MG/2ML IJ SOLN
4.0000 mg | Freq: Once | INTRAMUSCULAR | Status: AC
  Administered 2022-11-25: 4 mg via INTRAVENOUS
  Filled 2022-11-25: qty 2

## 2022-11-25 NOTE — ED Notes (Signed)
Pt having tonic clonic seizure in triage. Pt lowered to floor and placed on side. Seizure lasted 30 seconds. Provider at bedside

## 2022-11-25 NOTE — ED Notes (Signed)
Pt having second seizure, verbal orders for 1mg  ativan IM from Dr. Jarold Motto

## 2022-11-25 NOTE — ED Triage Notes (Signed)
Abdominal pain, nausea, vomiting for 2 days. Worsened today. Pt is lethargic, weak, unable to hold head up. Pt is diabetic. Dexcom is reading 299 on arrival. Pt seized twice during triage, provider in triage. Hx of seizures

## 2022-11-25 NOTE — ED Provider Notes (Incomplete)
[facility]PARTMENT AT [facility] Provider Note   CSN: [id number] Arrival date & time: 11/25/22  2048     History {Add pertinent medical, surgical, social history, OB history to HPI:1} Chief Complaint  Patient presents with   Emesis   Weakness   Abdominal Pain    Sara [name] is a 47 y.o. female.  46 year old female with a history of hysterectomy, diabetes type 1, Ehlers-Danlos syndrome, epilepsy on Keppra who presents to the emergency department with nausea and vomiting and abdominal pain.  History limited due to altered mental status.  Patient had a seizure just prior to my evaluation.  History obtained per husband who states that she has had abdominal pain and nausea and vomiting since last night.  Unsure of diarrhea.  Patient points to her right lower quadrant when asked about where her abdominal pain is located.  Her husband reports that she has been unable to take any of her medications due to her nausea and vomiting including her seizure medication.  Last seizure was approximately 1 month ago.  Only abdominal surgery is hysterectomy.      Home Medications Prior to Admission medications   Medication Sig Start Date End Date Taking? Authorizing Provider  albuterol (PROVENTIL HFA;VENTOLIN HFA) 108 (90 Base) MCG/ACT inhaler Inhale 2 to 3 puffs into the lungs every 6 hours as needed (respiratory problems)    [provider]  ALPRAZolam Prudy Feeler) 1 MG tablet Take by mouth.    [provider]  amLODipine (NORVASC) 10 MG tablet Take 1 tablet by mouth daily. 09/09/21 09/09/22  [provider]  betamethasone dipropionate 0.05 % cream APPLY TOPICALLY TWICE DAILY 11/09/22   Edwin Cap, DPM  blood glucose meter kit and supplies by Other route once for 1 dose. Use as instructed 04/21/22   [provider]  Buprenorphine HCl-Naloxone HCl 8-2 MG FILM Place under the tongue 2 (two) times daily. 03/17/22   [provider]  Calcium Carbonate-Simethicone (MAALOX MAX) 1000-60 MG CHEW Chew by mouth. 03/17/22   [provider]  cephALEXin (KEFLEX) 500 MG capsule Take 1 capsule (500 mg total) by mouth 3 (three) times daily. 10/28/22   Edwin Cap, DPM  Cholecalciferol (VITAMIN D3) 50 MCG (2000 UT) CAPS Take by mouth daily.    [provider]  Cyanocobalamin (VITAMIN B-12 IJ) Inject as directed every 30 (thirty) days.    [provider]  cyclobenzaprine (FLEXERIL) 10 MG tablet Take 1 tablet (10 mg total) by mouth 3 (three) times daily as needed for muscle spasms. 03/29/22   Roxy Horseman, PA-C  cyclobenzaprine (FLEXERIL) 10 MG tablet TAKE 1 TABLET BY MOUTH NIGHTLY AS NEEDED FOR REST 01/13/22   [provider]  diclofenac (VOLTAREN) 75 MG EC tablet     [provider]  dicyclomine (BENTYL) 10 MG capsule Take 10 mg by mouth every 6 (six) hours. 03/18/22   [provider]  donepezil (ARICEPT) 10 MG tablet Take by mouth.    [provider]  doxycycline (VIBRAMYCIN) 100 MG capsule Take 100 mg by mouth 2 (two) times daily. 05/28/22   [provider]  famotidine (PEPCID) 40 MG tablet SMARTSIG:1 Tablet(s) By Mouth Every Evening 05/07/22   [provider]  FERROUS SULFATE PO Take by mouth daily.    [provider]  furosemide (LASIX) 40 MG tablet Take 40 mg by mouth daily. 11/04/21   [provider]  gabapentin (NEURONTIN) 100 MG capsule Take 100 mg by mouth 3 (  three) times daily.    [provider]  gabapentin (NEURONTIN) 600 MG tablet Take by mouth.    [provider]  glucagon (GLUCAGEN) 1 MG SOLR injection Inject 1 mg into the vein once as needed for low blood sugar.    [provider]  hydrochlorothiazide (HYDRODIURIL) 25 MG tablet Take 1 tablet by mouth daily. 11/04/21 11/04/22  [provider]  ibuprofen (ADVIL) 800 MG tablet Take 800 mg by mouth every 8 (eight) hours as needed. 01/13/22    [provider]  insulin aspart (NOVOLOG) 100 UNIT/ML injection For insulin pump - administration average 160u over course of day    [provider]  insulin detemir (LEVEMIR) 100 UNIT/ML injection Inject 40 Units into the skin 2 (two) times daily. (USE ONLY IF INSULIN PUMP FAILS)    [provider]  Insulin Glargine (TOUJEO SOLOSTAR [place]) Inject 34 Units into the skin at bedtime.    [provider]  insulin lispro (HUMALOG) 100 UNIT/ML injection Inject into the skin 3 (three) times daily before meals. Sliding scale    [provider]  Lacosamide (VIMPAT) 150 MG TABS     [provider]  lamoTRIgine (LAMICTAL) 100 MG tablet Take 1 tablet (100 mg total) by mouth 2 (two) times daily. Patient taking differently: Take 250 mg by mouth 2 (two) times daily.  03/13/18 07/09/20  Ihor Austin, MD  lamoTRIgine (LAMICTAL) 100 MG tablet  04/26/20   [provider]  levETIRAcetam (KEPPRA) 750 MG tablet Take 750 mg by mouth 2 (two) times daily.    [provider]  levETIRAcetam (KEPPRA) 750 MG tablet Take by mouth. 01/13/22 01/13/23  [provider]  lisinopril (ZESTRIL) 10 MG tablet Take by mouth.    [provider]  loperamide (IMODIUM) 2 MG capsule Take by mouth. 03/18/22   [provider]  losartan (COZAAR) 25 MG tablet Take 25 mg by mouth daily.    [provider]  losartan (COZAAR) 50 MG tablet Take by mouth. 09/20/19 11/20/22  [provider]  methocarbamol (ROBAXIN) 750 MG tablet Take 1 tablet by mouth every 8 (eight) hours.    [provider]  montelukast (SINGULAIR) 10 MG tablet Take 10 mg by mouth at bedtime.    [provider]  Multiple Vitamins-Minerals (B COMPLEX-C-E-ZINC) tablet Take 1 tablet by mouth daily. 08/04/18   [provider]  Oxycodone HCl 10 MG TABS Take 10 mg by mouth 3 (three) times daily. 05/07/22   [provider]  pregabalin (LYRICA) 100 MG  capsule Take by mouth. 08/19/21 08/19/22  [provider]  prochlorperazine (COMPAZINE) 5 MG tablet Take by mouth. 03/18/22   [provider]  ropivacaine, PF, 5 mg/mL, 0.5%, (NAROPIN) 5 MG/ML injection Inject into the articular space. 07/14/21   [provider]  rosuvastatin (CRESTOR) 20 MG tablet Take 20 mg by mouth daily.    [provider]  Semaglutide,0.25 or 0.5MG /DOS, (OZEMPIC, 0.25 OR 0.5 MG/DOSE,) 2 MG/3ML SOPN INJECT 0.25 MG SUBCUTANEOUSLY  WEEKLY FOR 4 WEEKS, THEN 0.5 MG  WEEKLY THEREAFTER 02/16/22   [provider]  SENEXON-S 8.6-50 MG tablet Take 2 tablets by mouth daily. 03/17/22   [provider]  senna (SENOKOT) 8.6 MG tablet Take 1 tablet by mouth daily.    [provider]  topiramate (TOPAMAX) 100 MG tablet Take 1 tablet (100 mg total) by mouth 4 (four) times daily. Patient taking differently: Take 100 mg by mouth 2 (two) times daily.  03/13/18 07/09/20  Ihor Austin, MD  topiramate (TOPAMAX) 100 MG tablet     [provider]      Allergies    Other, Coconut (cocos nucifera), Coconut fatty acids, Latex, and Tape    Review of Systems   Review of Systems  Physical Exam Updated Vital Signs BP (!) 181/114   Pulse (!) 123   Temp 98.6 F (37 C) (Oral)   Resp (!) 24   Ht 5\' 2"  (1.575 m)   Wt 77.1 kg   SpO2 100%   BMI 31.09 kg/m  Physical Exam Vitals and nursing note reviewed.  Constitutional:      General: She is in acute distress.     Appearance: She is ill-appearing.     Comments: Postictal on my evaluation had seizure that lasted approximately 15 seconds that was generalized tonic-clonic before she had returned to baseline  HENT:     Head: Normocephalic and atraumatic.     Right Ear: External ear normal.     Left Ear: External ear normal.     Nose: Nose normal.  Eyes:     Extraocular Movements: Extraocular movements intact.     Conjunctiva/sclera: Conjunctivae normal.     Pupils: Pupils are equal,  round, and reactive to light.  Cardiovascular:     Rate and Rhythm: Regular rhythm. Tachycardia present.  Pulmonary:     Effort: Pulmonary effort is normal. No respiratory distress.  Abdominal:     General: Abdomen is flat. There is no distension.     Palpations: Abdomen is soft. There is no mass.     Tenderness: There is abdominal tenderness (Right lower quadrant). There is no guarding.  Musculoskeletal:     Cervical back: Normal range of motion and neck supple.     Right lower leg: No edema.     Left lower leg: No edema.  Skin:    General: Skin is warm and dry.  Neurological:     Comments: Initially was seizing and then was able to respond yes or no to questions.  Was able to point to locations where pain was.  Psychiatric:        Mood and Affect: Mood normal.     ED Results / Procedures / Treatments   Labs (all labs ordered are listed, but only abnormal results are displayed) Labs Reviewed  CBG MONITORING, ED - Abnormal; Notable for the following components:      Result Value   Glucose-Capillary 257 (*)    All other components within normal limits    EKG None  Radiology No results found.  Procedures Procedures   Medications Ordered in ED Medications  LORazepam (ATIVAN) 2 MG/ML injection (has no administration in time range)    ED Course/ Medical Decision Making/ A&P                            Medical Decision Making Amount and/or Complexity of Data Reviewed Labs: ordered. Radiology: ordered.  Risk Prescription drug management.   Sayge Brienza is a 47 y.o. female with comorbidities that complicate the patient evaluation including ***   Initial Ddx:  ***   MDM:  ***  Plan:  ***  ED Summary/Re-evaluation:  ***  This patient presents to the ED for concern of complaints listed in HPI, this involves an extensive number of treatment options, and is a complaint that carries with it a high risk of complications and morbidity. Disposition  including potential  need for admission considered.   Dispo: {Disposition:28069}  Additional history obtained from {Additional History:28067} Records reviewed {Records Reviewed:28068} The following labs were independently interpreted: {labs interpreted:28064} and show {lab findings:28250} I independently reviewed the following imaging with scope of interpretation limited to determining acute life threatening conditions related to emergency care: {imaging interpreted:28065} and agree with the radiologist interpretation with the following exceptions: *** I personally reviewed and interpreted cardiac monitoring: {cardiac monitoring:28251} I personally reviewed and interpreted the pt's EKG: see above for interpretation  I have reviewed the patients home medications and made adjustments as needed Consults: {Consultants:28063} Social Determinants of health:  ***  {Document critical care time when appropriate:1} {Document POCUS if Performed:1}   {Document critical care time when appropriate:1} {Document review of labs and clinical decision tools ie heart score, Chads2Vasc2 etc:1}  {Document your independent review of radiology images, and any outside records:1} {Document your discussion with family members, caretakers, and with consultants:1} {Document social determinants of health affecting pt's care:1} {Document your decision making why or why not admission, treatments were needed:1} Final Clinical Impression(s) / ED Diagnoses Final diagnoses:  None    Rx / DC Orders ED Discharge Orders     None

## 2022-11-25 NOTE — ED Notes (Signed)
Pt. CBG 297, RN made aware.

## 2022-11-26 ENCOUNTER — Inpatient Hospital Stay (HOSPITAL_COMMUNITY)
Admission: EM | Admit: 2022-11-26 | Discharge: 2022-12-02 | DRG: 887 | Disposition: A | Discharge status: Home Health | Payer: Medicare Other | Attending: Family Medicine | Admitting: Family Medicine

## 2022-11-26 DIAGNOSIS — K219 Gastro-esophageal reflux disease without esophagitis: Secondary | ICD-10-CM | POA: Diagnosis present

## 2022-11-26 DIAGNOSIS — E10319 Type 1 diabetes mellitus with unspecified diabetic retinopathy without macular edema: Secondary | ICD-10-CM | POA: Diagnosis present

## 2022-11-26 DIAGNOSIS — Z90711 Acquired absence of uterus with remaining cervical stump: Secondary | ICD-10-CM

## 2022-11-26 DIAGNOSIS — H5462 Unqualified visual loss, left eye, normal vision right eye: Secondary | ICD-10-CM | POA: Diagnosis present

## 2022-11-26 DIAGNOSIS — R1110 Vomiting, unspecified: Secondary | ICD-10-CM

## 2022-11-26 DIAGNOSIS — E1065 Type 1 diabetes mellitus with hyperglycemia: Secondary | ICD-10-CM | POA: Diagnosis present

## 2022-11-26 DIAGNOSIS — K2970 Gastritis, unspecified, without bleeding: Secondary | ICD-10-CM | POA: Diagnosis present

## 2022-11-26 DIAGNOSIS — G43909 Migraine, unspecified, not intractable, without status migrainosus: Secondary | ICD-10-CM | POA: Diagnosis present

## 2022-11-26 DIAGNOSIS — I679 Cerebrovascular disease, unspecified: Secondary | ICD-10-CM

## 2022-11-26 DIAGNOSIS — J4520 Mild intermittent asthma, uncomplicated: Secondary | ICD-10-CM | POA: Diagnosis present

## 2022-11-26 DIAGNOSIS — G40909 Epilepsy, unspecified, not intractable, without status epilepticus: Secondary | ICD-10-CM | POA: Diagnosis present

## 2022-11-26 DIAGNOSIS — S069XAS Unspecified intracranial injury with loss of consciousness status unknown, sequela: Secondary | ICD-10-CM

## 2022-11-26 DIAGNOSIS — Z794 Long term (current) use of insulin: Secondary | ICD-10-CM

## 2022-11-26 DIAGNOSIS — G4089 Other seizures: Secondary | ICD-10-CM | POA: Diagnosis present

## 2022-11-26 DIAGNOSIS — F17210 Nicotine dependence, cigarettes, uncomplicated: Secondary | ICD-10-CM | POA: Diagnosis present

## 2022-11-26 DIAGNOSIS — Z8249 Family history of ischemic heart disease and other diseases of the circulatory system: Secondary | ICD-10-CM

## 2022-11-26 DIAGNOSIS — I10 Essential (primary) hypertension: Secondary | ICD-10-CM | POA: Diagnosis present

## 2022-11-26 DIAGNOSIS — Q7962 Hypermobile Ehlers-Danlos syndrome: Secondary | ICD-10-CM

## 2022-11-26 DIAGNOSIS — E871 Hypo-osmolality and hyponatremia: Secondary | ICD-10-CM | POA: Diagnosis present

## 2022-11-26 DIAGNOSIS — I69854 Hemiplegia and hemiparesis following other cerebrovascular disease affecting left non-dominant side: Secondary | ICD-10-CM

## 2022-11-26 DIAGNOSIS — Z7985 Long-term (current) use of injectable non-insulin antidiabetic drugs: Secondary | ICD-10-CM

## 2022-11-26 DIAGNOSIS — Z91040 Latex allergy status: Secondary | ICD-10-CM

## 2022-11-26 DIAGNOSIS — E860 Dehydration: Secondary | ICD-10-CM | POA: Diagnosis present

## 2022-11-26 DIAGNOSIS — T50916A Underdosing of multiple unspecified drugs, medicaments and biological substances, initial encounter: Secondary | ICD-10-CM | POA: Diagnosis present

## 2022-11-26 DIAGNOSIS — F4489 Other dissociative and conversion disorders: Principal | ICD-10-CM | POA: Diagnosis present

## 2022-11-26 DIAGNOSIS — R569 Unspecified convulsions: Principal | ICD-10-CM

## 2022-11-26 DIAGNOSIS — Z79899 Other long term (current) drug therapy: Secondary | ICD-10-CM

## 2022-11-26 DIAGNOSIS — Z5986 Financial insecurity: Secondary | ICD-10-CM

## 2022-11-26 DIAGNOSIS — Z6836 Body mass index (BMI) 36.0-36.9, adult: Secondary | ICD-10-CM

## 2022-11-26 DIAGNOSIS — Z7982 Long term (current) use of aspirin: Secondary | ICD-10-CM

## 2022-11-26 DIAGNOSIS — D649 Anemia, unspecified: Secondary | ICD-10-CM | POA: Diagnosis present

## 2022-11-26 DIAGNOSIS — G894 Chronic pain syndrome: Secondary | ICD-10-CM | POA: Diagnosis present

## 2022-11-26 DIAGNOSIS — Z91138 Patient's unintentional underdosing of medication regimen for other reason: Secondary | ICD-10-CM

## 2022-11-26 DIAGNOSIS — Q796 Ehlers-Danlos syndrome, unspecified: Secondary | ICD-10-CM

## 2022-11-26 DIAGNOSIS — R112 Nausea with vomiting, unspecified: Secondary | ICD-10-CM | POA: Diagnosis present

## 2022-11-26 LAB — URINALYSIS, ROUTINE W REFLEX MICROSCOPIC
Bacteria, UA: NONE SEEN
Bilirubin Urine: NEGATIVE
Glucose, UA: 150 mg/dL — AB
Ketones, ur: 20 mg/dL — AB
Leukocytes,Ua: NEGATIVE
Nitrite: NEGATIVE
Protein, ur: >=300 mg/dL — AB
Specific Gravity, Urine: <=1.005 (ref 1.005–1.030)
pH: 6 (ref 5.0–8.0)

## 2022-11-26 MED ORDER — ONDANSETRON 4 MG PO TBDP: ORAL_TABLET | ORAL | 0 refills | Status: DC

## 2022-11-26 NOTE — ED Notes (Signed)
Patient reports she feels better.

## 2022-11-26 NOTE — ED Notes (Signed)
Assisted patient to restroom, patient unsteady.  Urine sent to lab

## 2022-11-26 NOTE — Discharge Instructions (Signed)
Seizure precautions: Per [place] DMV statutes, patients with seizures are not allowed to drive until they have been seizure-free for six months and cleared by a physician    Use caution when using heavy equipment or power tools. Avoid working on ladders or at heights. Take showers instead of baths. Ensure the water temperature is not too high on the home water heater. Do not go swimming alone. Do not lock yourself in a room alone (i.e. bathroom). When caring for infants or small children, sit down when holding, feeding, or changing them to minimize risk of injury to the child in the event you have a seizure. Maintain good sleep hygiene. Avoid alcohol.  

## 2022-11-27 ENCOUNTER — Other Ambulatory Visit: Payer: Self-pay

## 2022-11-27 ENCOUNTER — Emergency Department (HOSPITAL_COMMUNITY): Payer: Medicare Other

## 2022-11-27 ENCOUNTER — Encounter (HOSPITAL_COMMUNITY): Payer: Self-pay | Admitting: Emergency Medicine

## 2022-11-27 DIAGNOSIS — G4089 Other seizures: Secondary | ICD-10-CM | POA: Diagnosis present

## 2022-11-27 DIAGNOSIS — E1065 Type 1 diabetes mellitus with hyperglycemia: Secondary | ICD-10-CM | POA: Diagnosis present

## 2022-11-27 DIAGNOSIS — T887XXA Unspecified adverse effect of drug or medicament, initial encounter: Secondary | ICD-10-CM

## 2022-11-27 DIAGNOSIS — Q7962 Hypermobile Ehlers-Danlos syndrome: Secondary | ICD-10-CM | POA: Diagnosis not present

## 2022-11-27 DIAGNOSIS — R112 Nausea with vomiting, unspecified: Secondary | ICD-10-CM

## 2022-11-27 DIAGNOSIS — F17210 Nicotine dependence, cigarettes, uncomplicated: Secondary | ICD-10-CM | POA: Diagnosis present

## 2022-11-27 DIAGNOSIS — K219 Gastro-esophageal reflux disease without esophagitis: Secondary | ICD-10-CM | POA: Diagnosis not present

## 2022-11-27 DIAGNOSIS — Z5986 Financial insecurity: Secondary | ICD-10-CM | POA: Diagnosis not present

## 2022-11-27 DIAGNOSIS — I10 Essential (primary) hypertension: Secondary | ICD-10-CM | POA: Diagnosis present

## 2022-11-27 DIAGNOSIS — F445 Conversion disorder with seizures or convulsions: Secondary | ICD-10-CM | POA: Diagnosis not present

## 2022-11-27 DIAGNOSIS — F4489 Other dissociative and conversion disorders: Secondary | ICD-10-CM | POA: Diagnosis present

## 2022-11-27 DIAGNOSIS — E871 Hypo-osmolality and hyponatremia: Secondary | ICD-10-CM | POA: Diagnosis present

## 2022-11-27 DIAGNOSIS — T50916A Underdosing of multiple unspecified drugs, medicaments and biological substances, initial encounter: Secondary | ICD-10-CM | POA: Diagnosis present

## 2022-11-27 DIAGNOSIS — I69854 Hemiplegia and hemiparesis following other cerebrovascular disease affecting left non-dominant side: Secondary | ICD-10-CM | POA: Diagnosis not present

## 2022-11-27 DIAGNOSIS — R1110 Vomiting, unspecified: Secondary | ICD-10-CM | POA: Diagnosis not present

## 2022-11-27 DIAGNOSIS — Z6836 Body mass index (BMI) 36.0-36.9, adult: Secondary | ICD-10-CM | POA: Diagnosis not present

## 2022-11-27 DIAGNOSIS — E860 Dehydration: Secondary | ICD-10-CM | POA: Diagnosis present

## 2022-11-27 DIAGNOSIS — D649 Anemia, unspecified: Secondary | ICD-10-CM | POA: Diagnosis present

## 2022-11-27 DIAGNOSIS — H5462 Unqualified visual loss, left eye, normal vision right eye: Secondary | ICD-10-CM | POA: Diagnosis present

## 2022-11-27 DIAGNOSIS — Z794 Long term (current) use of insulin: Secondary | ICD-10-CM | POA: Diagnosis not present

## 2022-11-27 DIAGNOSIS — G40909 Epilepsy, unspecified, not intractable, without status epilepticus: Secondary | ICD-10-CM | POA: Diagnosis present

## 2022-11-27 DIAGNOSIS — S069XAS Unspecified intracranial injury with loss of consciousness status unknown, sequela: Secondary | ICD-10-CM | POA: Diagnosis not present

## 2022-11-27 DIAGNOSIS — Q796 Ehlers-Danlos syndrome, unspecified: Secondary | ICD-10-CM | POA: Diagnosis not present

## 2022-11-27 DIAGNOSIS — I679 Cerebrovascular disease, unspecified: Secondary | ICD-10-CM | POA: Diagnosis not present

## 2022-11-27 DIAGNOSIS — R569 Unspecified convulsions: Secondary | ICD-10-CM | POA: Diagnosis present

## 2022-11-27 DIAGNOSIS — J4520 Mild intermittent asthma, uncomplicated: Secondary | ICD-10-CM | POA: Diagnosis present

## 2022-11-27 DIAGNOSIS — K2970 Gastritis, unspecified, without bleeding: Secondary | ICD-10-CM | POA: Diagnosis present

## 2022-11-27 DIAGNOSIS — G43909 Migraine, unspecified, not intractable, without status migrainosus: Secondary | ICD-10-CM | POA: Diagnosis present

## 2022-11-27 DIAGNOSIS — G894 Chronic pain syndrome: Secondary | ICD-10-CM | POA: Diagnosis present

## 2022-11-27 DIAGNOSIS — E10319 Type 1 diabetes mellitus with unspecified diabetic retinopathy without macular edema: Secondary | ICD-10-CM | POA: Diagnosis present

## 2022-11-27 LAB — CBC
HCT: 36 % (ref 36.0–46.0)
Hemoglobin: 11.1 g/dL — ABNORMAL LOW (ref 12.0–15.0)
MCH: 24.6 pg — ABNORMAL LOW (ref 26.0–34.0)
MCHC: 30.8 g/dL (ref 30.0–36.0)
MCV: 79.8 fL — ABNORMAL LOW (ref 80.0–100.0)
Platelets: 201 10*3/uL (ref 150–400)
RBC: 4.51 MIL/uL (ref 3.87–5.11)
RDW: 13.4 % (ref 11.5–15.5)
WBC: 7.3 10*3/uL (ref 4.0–10.5)
nRBC: 0 % (ref 0.0–0.2)

## 2022-11-27 LAB — LIPASE, BLOOD: Lipase: 54 U/L — ABNORMAL HIGH (ref 11–51)

## 2022-11-27 LAB — CBC WITH DIFFERENTIAL/PLATELET
Abs Immature Granulocytes: 0.11 10*3/uL — ABNORMAL HIGH (ref 0.00–0.07)
Basophils Absolute: 0 10*3/uL (ref 0.0–0.1)
Basophils Relative: 0 %
Eosinophils Absolute: 0.1 10*3/uL (ref 0.0–0.5)
Eosinophils Relative: 1 %
HCT: 38.1 % (ref 36.0–46.0)
Hemoglobin: 11.5 g/dL — ABNORMAL LOW (ref 12.0–15.0)
Immature Granulocytes: 1 %
Lymphocytes Relative: 15 %
Lymphs Abs: 1.4 10*3/uL (ref 0.7–4.0)
MCH: 24.2 pg — ABNORMAL LOW (ref 26.0–34.0)
MCHC: 30.2 g/dL (ref 30.0–36.0)
MCV: 80 fL (ref 80.0–100.0)
Monocytes Absolute: 0.7 10*3/uL (ref 0.1–1.0)
Monocytes Relative: 7 %
Neutro Abs: 6.8 10*3/uL (ref 1.7–7.7)
Neutrophils Relative %: 76 %
Platelets: 258 10*3/uL (ref 150–400)
RBC: 4.76 MIL/uL (ref 3.87–5.11)
RDW: 13.3 % (ref 11.5–15.5)
WBC: 9.1 10*3/uL (ref 4.0–10.5)
nRBC: 0 % (ref 0.0–0.2)

## 2022-11-27 LAB — HIV ANTIBODY (ROUTINE TESTING W REFLEX): HIV Screen 4th Generation wRfx: NONREACTIVE

## 2022-11-27 LAB — COMPREHENSIVE METABOLIC PANEL
ALT: 17 U/L (ref 0–44)
ALT: 19 U/L (ref 0–44)
AST: 16 U/L (ref 15–41)
AST: 24 U/L (ref 15–41)
Albumin: 3.5 g/dL (ref 3.5–5.0)
Albumin: 3.9 g/dL (ref 3.5–5.0)
Alkaline Phosphatase: 79 U/L (ref 38–126)
Alkaline Phosphatase: 91 U/L (ref 38–126)
Anion gap: 13 (ref 5–15)
Anion gap: 9 (ref 5–15)
BUN: 12 mg/dL (ref 6–20)
BUN: 16 mg/dL (ref 6–20)
CO2: 23 mmol/L (ref 22–32)
CO2: 23 mmol/L (ref 22–32)
Calcium: 8.1 mg/dL — ABNORMAL LOW (ref 8.9–10.3)
Calcium: 8.6 mg/dL — ABNORMAL LOW (ref 8.9–10.3)
Chloride: 101 mmol/L (ref 98–111)
Chloride: 98 mmol/L (ref 98–111)
Creatinine, Ser: 0.77 mg/dL (ref 0.44–1.00)
Creatinine, Ser: 0.77 mg/dL (ref 0.44–1.00)
GFR, Estimated: >60 mL/min (ref >60)
GFR, Estimated: >60 mL/min (ref >60)
Glucose, Bld: 213 mg/dL — ABNORMAL HIGH (ref 70–99)
Glucose, Bld: 249 mg/dL — ABNORMAL HIGH (ref 70–99)
Potassium: 3.6 mmol/L (ref 3.5–5.1)
Potassium: 3.8 mmol/L (ref 3.5–5.1)
Sodium: 133 mmol/L — ABNORMAL LOW (ref 135–145)
Sodium: 134 mmol/L — ABNORMAL LOW (ref 135–145)
Total Bilirubin: 0.6 mg/dL (ref 0.3–1.2)
Total Bilirubin: 0.7 mg/dL (ref 0.3–1.2)
Total Protein: 7.1 g/dL (ref 6.5–8.1)
Total Protein: 8 g/dL (ref 6.5–8.1)

## 2022-11-27 LAB — GLUCOSE, CAPILLARY
Glucose-Capillary: 188 mg/dL — ABNORMAL HIGH (ref 70–99)
Glucose-Capillary: 135 mg/dL — ABNORMAL HIGH (ref 70–99)
Glucose-Capillary: 144 mg/dL — ABNORMAL HIGH (ref 70–99)
Glucose-Capillary: 134 mg/dL — ABNORMAL HIGH (ref 70–99)
Glucose-Capillary: 131 mg/dL — ABNORMAL HIGH (ref 70–99)

## 2022-11-27 LAB — HEMOGLOBIN A1C
Hgb A1c MFr Bld: 7.7 % — ABNORMAL HIGH (ref 4.8–5.6)
Mean Plasma Glucose: 174.29 mg/dL

## 2022-11-27 LAB — CBG MONITORING, ED: Glucose-Capillary: 243 mg/dL — ABNORMAL HIGH (ref 70–99)

## 2022-11-27 LAB — PHOSPHORUS: Phosphorus: 2.9 mg/dL (ref 2.5–4.6)

## 2022-11-27 LAB — CK: Total CK: 214 U/L (ref 38–234)

## 2022-11-27 LAB — MAGNESIUM: Magnesium: 2.2 mg/dL (ref 1.7–2.4)

## 2022-11-27 MED ORDER — METOCLOPRAMIDE HCL 5 MG/ML IJ SOLN: 10.0000 mg | Freq: Three times a day (TID) | INTRAMUSCULAR | Status: DC | PRN

## 2022-11-27 MED ORDER — LORAZEPAM 2 MG/ML IJ SOLN
4.0000 mg | INTRAMUSCULAR | Status: AC | PRN
  Administered 2022-11-28 (×2): 4 mg via INTRAVENOUS
  Filled 2022-11-27 (×2): qty 2

## 2022-11-27 MED ORDER — PANTOPRAZOLE SODIUM 40 MG IV SOLR
40.0000 mg | Freq: Every day | INTRAVENOUS | Status: DC
  Administered 2022-11-27 – 2022-12-02 (×6): 40 mg via INTRAVENOUS
  Filled 2022-11-27 (×6): qty 10

## 2022-11-27 MED ORDER — ALBUTEROL SULFATE (2.5 MG/3ML) 0.083% IN NEBU: 2.5000 mg | INHALATION_SOLUTION | RESPIRATORY_TRACT | Status: DC | PRN

## 2022-11-27 MED ORDER — ONDANSETRON HCL 4 MG PO TABS: 4.0000 mg | ORAL_TABLET | Freq: Four times a day (QID) | ORAL | Status: DC | PRN

## 2022-11-27 MED ORDER — LOSARTAN POTASSIUM 50 MG PO TABS
100.0000 mg | ORAL_TABLET | Freq: Every day | ORAL | Status: DC
  Administered 2022-11-27 – 2022-12-01 (×5): 100 mg via ORAL
  Filled 2022-11-27 (×5): qty 2

## 2022-11-27 MED ORDER — ACETAMINOPHEN 500 MG PO TABS: 500.0000 mg | ORAL_TABLET | Freq: Four times a day (QID) | ORAL | Status: DC | PRN

## 2022-11-27 MED ORDER — ACETAMINOPHEN 325 MG PO TABS
650.0000 mg | ORAL_TABLET | Freq: Four times a day (QID) | ORAL | Status: DC | PRN
  Administered 2022-11-28 – 2022-12-02 (×5): 650 mg via ORAL
  Filled 2022-11-27 (×6): qty 2

## 2022-11-27 MED ORDER — LEVETIRACETAM IN NACL 1000 MG/100ML IV SOLN
1000.0000 mg | Freq: Once | INTRAVENOUS | Status: AC
  Administered 2022-11-27: 1000 mg via INTRAVENOUS
  Filled 2022-11-27: qty 100

## 2022-11-27 MED ORDER — ORAL CARE MOUTH RINSE: 15.0000 mL | OROMUCOSAL | Status: DC | PRN

## 2022-11-27 MED ORDER — ACETAMINOPHEN 650 MG RE SUPP: 650.0000 mg | Freq: Four times a day (QID) | RECTAL | Status: DC | PRN

## 2022-11-27 MED ORDER — SODIUM CHLORIDE 0.9 % IV SOLN: INTRAVENOUS | Status: AC

## 2022-11-27 MED ORDER — ASPIRIN 81 MG PO CHEW
81.0000 mg | CHEWABLE_TABLET | Freq: Every day | ORAL | Status: DC
  Administered 2022-11-27 – 2022-12-02 (×6): 81 mg via ORAL
  Filled 2022-11-27 (×6): qty 1

## 2022-11-27 MED ORDER — PROCHLORPERAZINE EDISYLATE 10 MG/2ML IJ SOLN
10.0000 mg | Freq: Four times a day (QID) | INTRAMUSCULAR | Status: DC | PRN
  Administered 2022-11-27 – 2022-11-28 (×3): 10 mg via INTRAVENOUS
  Filled 2022-11-27 (×3): qty 2

## 2022-11-27 MED ORDER — SUCRALFATE 1 G PO TABS
1.0000 g | ORAL_TABLET | Freq: Two times a day (BID) | ORAL | Status: DC
  Administered 2022-11-27 – 2022-12-02 (×11): 1 g via ORAL
  Filled 2022-11-27 (×11): qty 1

## 2022-11-27 MED ORDER — SODIUM CHLORIDE 0.9 % IV SOLN
100.0000 mg | Freq: Once | INTRAVENOUS | Status: AC
  Administered 2022-11-27: 100 mg via INTRAVENOUS
  Filled 2022-11-27: qty 10

## 2022-11-27 MED ORDER — LEVETIRACETAM IN NACL 1500 MG/100ML IV SOLN
1500.0000 mg | Freq: Two times a day (BID) | INTRAVENOUS | Status: DC
  Administered 2022-11-27 – 2022-11-28 (×3): 1500 mg via INTRAVENOUS
  Filled 2022-11-27 (×3): qty 100

## 2022-11-27 MED ORDER — GABAPENTIN 300 MG PO CAPS
900.0000 mg | ORAL_CAPSULE | Freq: Three times a day (TID) | ORAL | Status: DC
  Administered 2022-11-27 – 2022-12-02 (×17): 900 mg via ORAL
  Filled 2022-11-27 (×17): qty 3

## 2022-11-27 MED ORDER — SODIUM CHLORIDE 0.9 % IV BOLUS
1000.0000 mL | Freq: Once | INTRAVENOUS | Status: AC
  Administered 2022-11-27: 1000 mL via INTRAVENOUS

## 2022-11-27 MED ORDER — HEPARIN SODIUM (PORCINE) 5000 UNIT/ML IJ SOLN
5000.0000 [IU] | Freq: Three times a day (TID) | INTRAMUSCULAR | Status: DC
  Administered 2022-11-27 – 2022-12-02 (×17): 5000 [IU] via SUBCUTANEOUS
  Filled 2022-11-27 (×17): qty 1

## 2022-11-27 MED ORDER — ATORVASTATIN CALCIUM 80 MG PO TABS
80.0000 mg | ORAL_TABLET | Freq: Every day | ORAL | Status: DC
  Administered 2022-11-27 – 2022-12-02 (×6): 80 mg via ORAL
  Filled 2022-11-27: qty 2
  Filled 2022-11-27: qty 1
  Filled 2022-11-27 (×2): qty 2
  Filled 2022-11-27: qty 1
  Filled 2022-11-27: qty 2

## 2022-11-27 MED ORDER — ONDANSETRON HCL 4 MG/2ML IJ SOLN
4.0000 mg | Freq: Four times a day (QID) | INTRAMUSCULAR | Status: DC | PRN
  Filled 2022-11-27: qty 2

## 2022-11-27 MED ORDER — SODIUM CHLORIDE 0.9 % IV SOLN
100.0000 mg | Freq: Two times a day (BID) | INTRAVENOUS | Status: DC
  Administered 2022-11-27 – 2022-11-28 (×3): 100 mg via INTRAVENOUS
  Filled 2022-11-27 (×4): qty 10

## 2022-11-27 MED ORDER — PANTOPRAZOLE SODIUM 40 MG PO TBEC: 40.0000 mg | DELAYED_RELEASE_TABLET | Freq: Every day | ORAL | Status: DC

## 2022-11-27 MED ORDER — LORAZEPAM 2 MG/ML IJ SOLN
1.0000 mg | Freq: Once | INTRAMUSCULAR | Status: AC
  Administered 2022-11-27: 1 mg via INTRAVENOUS
  Filled 2022-11-27: qty 1

## 2022-11-27 MED ORDER — INSULIN ASPART 100 UNIT/ML IJ SOLN
0.0000 [IU] | Freq: Three times a day (TID) | INTRAMUSCULAR | Status: DC
  Administered 2022-11-27: 2 [IU] via SUBCUTANEOUS
  Administered 2022-11-27: 3 [IU] via SUBCUTANEOUS
  Administered 2022-11-27: 2 [IU] via SUBCUTANEOUS
  Administered 2022-11-28: 3 [IU] via SUBCUTANEOUS
  Administered 2022-11-28: 2 [IU] via SUBCUTANEOUS
  Administered 2022-11-28 – 2022-11-29 (×4): 3 [IU] via SUBCUTANEOUS
  Administered 2022-11-30 (×2): 5 [IU] via SUBCUTANEOUS
  Administered 2022-11-30: 2 [IU] via SUBCUTANEOUS
  Administered 2022-12-01: 3 [IU] via SUBCUTANEOUS
  Administered 2022-12-01: 5 [IU] via SUBCUTANEOUS
  Administered 2022-12-01 – 2022-12-02 (×4): 3 [IU] via SUBCUTANEOUS
  Filled 2022-11-27: qty 0.15

## 2022-11-27 MED ORDER — IBUPROFEN 800 MG PO TABS
800.0000 mg | ORAL_TABLET | Freq: Three times a day (TID) | ORAL | Status: DC | PRN
  Administered 2022-11-27: 800 mg via ORAL
  Filled 2022-11-27: qty 1

## 2022-11-27 MED ORDER — AMLODIPINE BESYLATE 10 MG PO TABS
10.0000 mg | ORAL_TABLET | Freq: Every day | ORAL | Status: DC
  Administered 2022-11-27 – 2022-12-01 (×5): 10 mg via ORAL
  Filled 2022-11-27 (×5): qty 1

## 2022-11-27 MED ORDER — INSULIN GLARGINE-YFGN 100 UNIT/ML ~~LOC~~ SOLN
15.0000 [IU] | Freq: Every day | SUBCUTANEOUS | Status: DC
  Administered 2022-11-27: 15 [IU] via SUBCUTANEOUS
  Filled 2022-11-27 (×2): qty 0.15

## 2022-11-27 MED ORDER — ONDANSETRON HCL 4 MG/2ML IJ SOLN
4.0000 mg | Freq: Once | INTRAMUSCULAR | Status: AC
  Administered 2022-11-27: 4 mg via INTRAVENOUS
  Filled 2022-11-27: qty 2

## 2022-11-27 MED ORDER — ORAL CARE MOUTH RINSE
15.0000 mL | OROMUCOSAL | Status: DC
  Administered 2022-11-27 – 2022-11-30 (×11): 15 mL via OROMUCOSAL

## 2022-11-27 NOTE — Progress Notes (Addendum)
PROGRESS NOTE    Sara [name]  FTD:[id number] DOB: 05-22-1976 DOA: 11/26/2022 PCP: Sara Madura, MD   Brief Narrative: Sara [name] is a 47 y.o. female with a history of diabetes mellitus type 2, Ehlers-Danlos, hypertension, migraine, stroke, seizure disorder. Patient presented secondary to nausea/vomiting in addition to seizure episode. Patient admitted and started on IV fluids and antiemetics. IV Keppra started.   Assessment and Plan:  Seizure disorder Patient with seizures secondary to medication non-adherence which was secondary to inability to tolerate oral intake from intractable nausea/vomiting. -Continue Keppra and Vimpat  Addendum: patient developed seizure activity this afternoon around 1515 which aborted spontaneously (lasting about 90 seconds). Ativan 1 mg given. Patient post-ictal but stable. Will add Ativan PRN and upgrade to progressive. Neurology consult placed.  Abdominal pain Secondary   GERD -Protonix  Asthma Stable. -Continue albuterol PRN  Diabetes mellitus type 2 Uncontrolled with hyperglycemia. Hemoglobin A1C of 7.7%. Patient is on Ozempic as an outpatient which was recently increased to 1 mg weekly. Started on Sara [name] and SSI on admission. -Continue SSI -Discontinue Semglee for now  Ehlers-Danlos disease Noted  Hyponatremia Mild. Likely secondary to poor oral intake and dehydration. Patient is also on hydrochlorothiazide which could contribute.  Primary hypertension Patient is on amlodipine, losartan and hydrochlorothiazide as an outpatient. -Continue amlodipine and losartan -Hold hydrochlorothiazide secondary to hyponatremia  DVT prophylaxis: Heparin subq Code Status:   Code Status: Full Code Family Communication: None at bedside Disposition Plan: Discharge home likely in 1-2 days once able to tolerate oral intake   Consultants:  None  Procedures:  None  Antimicrobials: None    Subjective: Patient  reports continued nausea with an episode of emesis this morning. Some abdominal pain. No other concerns.  Objective: BP 126/87 (BP Location: Right Arm)   Pulse (!) 109   Temp 98.7 F (37.1 C) (Oral)   Resp 18   Wt 77.1 kg   SpO2 98%   BMI 31.09 kg/m   Examination:  General exam: Appears calm and comfortable Respiratory system: Clear to auscultation. Respiratory effort normal. Cardiovascular system: S1 & S2 heard, RRR. No murmurs, rubs, gallops or clicks. Gastrointestinal system: Abdomen is nondistended, soft and nontender. No organomegaly or masses felt. Normal bowel sounds heard. Central nervous system: Alert and oriented. No focal neurological deficits. Musculoskeletal: No edema. No calf tenderness Skin: No cyanosis. No rashes Psychiatry: Judgement and insight appear normal. Mood & affect appropriate.    Data Reviewed: I have personally reviewed following labs and imaging studies  CBC Lab Results  Component Value Date   WBC 7.3 11/27/2022   RBC 4.51 11/27/2022   HGB 11.1 (L) 11/27/2022   HCT 36.0 11/27/2022   MCV 79.8 (L) 11/27/2022   MCH 24.6 (L) 11/27/2022   PLT 201 11/27/2022   MCHC 30.8 11/27/2022   RDW 13.4 11/27/2022   LYMPHSABS 1.4 11/27/2022   MONOABS 0.7 11/27/2022   EOSABS 0.1 11/27/2022   BASOSABS 0.0 11/27/2022     Last metabolic panel Lab Results  Component Value Date   NA 133 (L) 11/27/2022   K 3.6 11/27/2022   CL 101 11/27/2022   CO2 23 11/27/2022   BUN 12 11/27/2022   CREATININE 0.77 11/27/2022   GLUCOSE 213 (H) 11/27/2022   GFRNONAA >60 11/27/2022   GFRAA >60 03/12/2018   CALCIUM 8.1 (L) 11/27/2022   PHOS 2.9 11/27/2022   PROT 7.1 11/27/2022   ALBUMIN 3.5 11/27/2022   BILITOT 0.6 11/27/2022   ALKPHOS 79 11/27/2022   [name] 16  11/27/2022   ALT 17 11/27/2022   ANIONGAP 9 11/27/2022    GFR: Estimated Creatinine Clearance: 84.5 mL/min (by C-G formula based on SCr of 0.77 mg/dL).  No results found for this or any previous visit (from  the past 240 hour(s)).    Radiology Studies: CT Head Wo Contrast  Result Date: 11/27/2022 CLINICAL DATA:  Mental status change, unknown cause. EXAM: CT HEAD WITHOUT CONTRAST TECHNIQUE: Contiguous axial images were obtained from the base of the skull through the vertex without intravenous contrast. RADIATION DOSE REDUCTION: This exam was performed according to the departmental dose-optimization program which includes automated exposure control, adjustment of the mA and/or kV according to patient size and/or use of iterative reconstruction technique. COMPARISON:  05/29/2022 FINDINGS: Brain: No acute intracranial abnormality. Specifically, no hemorrhage, hydrocephalus, mass lesion, acute infarction, or significant intracranial injury. Vascular: No hyperdense vessel or unexpected calcification. Skull: No acute calvarial abnormality. Sinuses/Orbits: No acute findings Other: None IMPRESSION: No acute intracranial abnormality. Electronically Signed   By: Sara [name] M.D.   On: 11/27/2022 02:12   CT ABDOMEN PELVIS W CONTRAST  Result Date: 11/25/2022 CLINICAL DATA:  Abdominal pain with nausea and vomiting for 2 days, initial encounter EXAM: CT ABDOMEN AND PELVIS WITH CONTRAST TECHNIQUE: Multidetector CT imaging of the abdomen and pelvis was performed using the standard protocol following bolus administration of intravenous contrast. RADIATION DOSE REDUCTION: This exam was performed according to the departmental dose-optimization program which includes automated exposure control, adjustment of the mA and/or kV according to patient size and/or use of iterative reconstruction technique. CONTRAST:  OMNIPAQUE IOHEXOL 300 MG/ML  SOLN COMPARISON:  03/29/2022 FINDINGS: Lower chest: No acute abnormality. Hepatobiliary: Mild fatty infiltration of the liver is noted. The gallbladder is within normal limits. Pancreas: Unremarkable. No pancreatic ductal dilatation or surrounding inflammatory changes. Spleen: Normal in  size without focal abnormality. Adrenals/Urinary Tract: Adrenal glands are within normal limits. Kidneys are well visualize within normal enhancement pattern. No renal calculi or obstructive changes are seen. The bladder is partially distended. Stomach/Bowel: Colon shows no obstructive or inflammatory changes. The appendix is within normal limits. Small bowel and stomach are within normal limits. Vascular/Lymphatic: Aortic atherosclerosis. No enlarged abdominal or pelvic lymph nodes. Reproductive: Uterus has been removed. Bilateral adnexal cysts are seen. The largest of these measures 3.6 cm on the right slightly enlarged when compared with the prior exam. Other: No abdominal wall hernia or abnormality. No abdominopelvic ascites. Musculoskeletal: No acute or significant osseous findings. IMPRESSION: Bilateral adnexal cysts measuring up to 3.6 cm. These are slightly more prominent than that seen on the prior exam. No follow-up imaging recommended. Note: This recommendation does not apply to premenarchal patients and to those with increased risk (genetic, family history, elevated tumor markers or other high-risk factors) of ovarian cancer. Reference: JACR 2020 Feb; 17(2):248-254 Fatty liver. No other focal abnormality is noted. Electronically Signed   By: Alcide Clever M.D.   On: 11/25/2022 23:42      LOS: 0 days    Jacquelin Hawking, MD [facility] 11/27/2022, 1:28 PM   If 7PM-7AM, please contact night-coverage [email or web address]

## 2022-11-27 NOTE — Progress Notes (Signed)
   11/27/22 1512  Provider Notification  Provider Name/Title Nettey  Date Provider Notified 11/27/22  Time Provider Notified 1512  Method of Notification Page  Notification Reason Other (Comment) (seizure activity)  Provider response At bedside;See new orders (Dr Butler Denmark @ bedside for Dr Caleb Popp, pt assessed, x1 Ativan given per order, VS obtained)  Date of Provider Response 11/27/22  Time of Provider Response 1516

## 2022-11-27 NOTE — H&P (Signed)
History and Physical    Sara [name] YNW:[id number] DOB: February 26, 1976 DOA: 11/26/2022  PCP: Teresita Madura, MD  Patient coming from:   I have personally briefly reviewed patient's old medical records in [facility]  Chief Complaint:   HPI: Sara [name] is a 47 y.o. female with medical history significant of asthma, DMII, Ehlers-Danlos disease, hypertension, migraine ,  history of stroke ,seizure on keppra d/o who presents to ED BIB with complaints of n/v/x 4-5 days not able to keep med down and most recently increase seizure activity.  Patient was seen for same on 4/10 and was noted to have seized twice in triage at that time for which she was treated th 1 mg of IM ativan. Patient evaluated  was thought to have break through seizure due to inability to take medications work up unrevealing and patient was sent home.  Patient now returns with persistent symptoms and recurrent seizures.  Patient continue to have nausea  and not able to tolerate po currently. She also notes epigastric pain. She notes no fevers no chills, no current diarrhea, no sob or chest pain., no dysuria.  She does note interim history of resuming Ozempic   She notes no sick contacts.   ED Course:  Vitals:   Afeb, bp 182/113(repeat 163/96) , hr 113, rr 20 , sat 96%  MVH:QIONG tachycardia Cbc: wbc 9.1, hgb 11.5, plt 258,  NA 134, K 3.8, cl 98, glu 249 , cr 0.77 lipase 54  Ck 214  CT IMPRESSION: Bilateral adnexal cysts measuring up to 3.6 cm. These are slightly more prominent than that seen on the prior exam. UA neg  Tx Zofran,  ns 1L , ativan 48m g iv, Keppra 1gram , vimpat  iv  Neurology was consulted by EDP Who rec load with keppra , add vimpat  and if recurrent sz will need eeg and neurology evaluation  Review of Systems: As per HPI otherwise 10 point review of systems negative.   Past Medical History:  Diagnosis Date   Adopted    Arthritis    hips, lower back   Asthma     Blind left eye    Complication of anesthesia    Was able to feel procedure during neck surgery   Diabetes mellitus without complication    Ehlers-Danlos disease    Epilepsy    Hypertension    Migraine headache    approx 6x yr (now getting injections from neurology)   Seizures    most recent 05/2020   Stroke 10/2020    Past Surgical History:  Procedure Laterality Date   CARPAL TUNNEL RELEASE     CATARACT EXTRACTION W/PHACO Right 06/18/2020   Procedure: CATARACT EXTRACTION PHACO AND INTRAOCULAR LENS PLACEMENT (IOC) RIGHT DIABETIC 13.23 01:42.7;  Surgeon: Galen Manila, MD;  Location: [facility];  Service: Ophthalmology;  Laterality: Right;  Diabetic - insulin   CATARACT EXTRACTION W/PHACO Left 07/09/2020   Procedure: CATARACT EXTRACTION PHACO AND INTRAOCULAR LENS PLACEMENT (IOC) LEFT DIABETIC 9.99 01:19.3 ;  Surgeon: Galen Manila, MD;  Location: [facility];  Service: Ophthalmology;  Laterality: Left;  Diabetic - insulin   CERVICAL FUSION     CESAREAN SECTION  x3   HIP ARTHROSCOPY     PARTIAL HYSTERECTOMY       reports that she has been smoking cigarettes. She has been smoking an average of .33 packs per day. She has never used smokeless tobacco. She reports current alcohol use of about 1.0 standard drink of alcohol per  week. She reports that she does not use drugs.  Allergies  Allergen Reactions   Other Hives, Itching and Rash    Coban, patient states that skin turn red and has serve itching  Coban, patient states that skin turn red and has serve itching  Coban, patient states that skin turn red and has serve itching  Coban, patient states that skin turn red and has serve itching     Coconut (Cocos Nucifera) Itching and Swelling    Coconut,  Facial swelling   Coconut Fatty Acids Itching   Latex Itching and Rash    (Elastic in underwear)   Tape Rash    Family History  Problem Relation Age of Onset   Hypertension Other     Prior to Admission  medications   Medication Sig Start Date End Date Taking? Authorizing Provider  acetaminophen (TYLENOL) 500 MG tablet Take 500 mg by mouth every 6 (six) hours as needed for moderate pain.   Yes [provider]  albuterol (PROVENTIL HFA;VENTOLIN HFA) 108 (90 Base) MCG/ACT inhaler Inhale 2 to 3 puffs into the lungs every 6 hours as needed (respiratory problems)   Yes [provider]  amLODipine (NORVASC) 10 MG tablet Take 10 mg by mouth daily.   Yes [provider]  aspirin 81 MG chewable tablet Chew 81 mg by mouth daily.   Yes [provider]  atorvastatin (LIPITOR) 80 MG tablet Take 80 mg by mouth daily.   Yes [provider]  betamethasone dipropionate 0.05 % cream APPLY TOPICALLY TWICE DAILY Patient taking differently: Apply 1 Application topically 2 (two) times daily. 11/09/22  Yes McDonald, Rachelle Hora, DPM  bisacodyl (DULCOLAX) 10 MG suppository Place 10 mg rectally daily as needed for moderate constipation.   Yes [provider]  Calcium Carbonate-Simethicone (MAALOX MAX) 1000-60 MG CHEW Chew 2 tablets by mouth daily as needed (indigestion). 03/17/22  Yes [provider]  cetirizine (ZYRTEC) 10 MG tablet Take 10 mg by mouth daily.   Yes [provider]  cyclobenzaprine (FLEXERIL) 10 MG tablet Take 1 tablet (10 mg total) by mouth 3 (three) times daily as needed for muscle spasms. Patient taking differently: Take 10 mg by mouth at bedtime. 03/29/22  Yes Roxy Horseman, PA-C  famotidine (PEPCID) 40 MG tablet Take 40 mg by mouth 2 (two) times daily. 05/07/22  Yes [provider]  furosemide (LASIX) 20 MG tablet Take 60 mg by mouth daily.   Yes [provider]  gabapentin (NEURONTIN) 300 MG capsule Take 900 mg by mouth 3 (three) times daily.   Yes [provider]  hydrochlorothiazide (HYDRODIURIL) 50 MG tablet Take 50 mg by mouth daily.   Yes [provider]  ibuprofen (ADVIL) 800 MG tablet Take 800  mg by mouth every 8 (eight) hours as needed for moderate pain. 01/13/22  Yes [provider]  insulin aspart (NOVOLOG) 100 UNIT/ML injection For insulin pump - administration average 160u over course of day   Yes [provider]  insulin glargine (LANTUS) 100 UNIT/ML injection Inject 15 Units into the skin at bedtime.   Yes [provider]  insulin lispro (HUMALOG) 100 UNIT/ML injection Inject 10 Units into the skin 3 (three) times daily before meals. Sliding scale   Yes [provider]  levETIRAcetam (KEPPRA) 750 MG tablet Take 1,500 mg by mouth 2 (two) times daily.   Yes [provider]  loperamide (IMODIUM) 2 MG capsule Take 2 mg by mouth 4 (four) times daily as  needed for diarrhea or loose stools. 03/18/22  Yes [provider]  losartan (COZAAR) 100 MG tablet Take 100 mg by mouth daily.   Yes [provider]  nicotine (NICOTROL) 10 MG inhaler Inhale 1 continuous puffing into the lungs every 2 (two) hours as needed for smoking cessation.   Yes [provider]  omeprazole (PRILOSEC) 20 MG capsule Take 20 mg by mouth 2 (two) times daily before a meal.   Yes [provider]  polyethylene glycol (MIRALAX / GLYCOLAX) 17 g packet Take 17 g by mouth daily.   Yes [provider]  pregabalin (LYRICA) 100 MG capsule Take 100 mg by mouth 3 (three) times daily.   Yes [provider]  prochlorperazine (COMPAZINE) 5 MG tablet Take 5 mg by mouth every 6 (six) hours as needed for nausea or vomiting. 03/18/22  Yes [provider]  Semaglutide, 1 MG/DOSE, (OZEMPIC, 1 MG/DOSE,) 4 MG/3ML SOPN Inject 1 mg into the skin once a week.   Yes [provider]  SENEXON-S 8.6-50 MG tablet Take 2 tablets by mouth daily. 03/17/22  Yes [provider]  tacrolimus (PROTOPIC) 0.1 % ointment Apply 1 Application topically 2 (two) times daily.   Yes [provider]  Thiamine HCl (VITAMIN B-1) 250 MG tablet  Take 250 mg by mouth daily.   Yes [provider]  amLODipine (NORVASC) 10 MG tablet Take 1 tablet by mouth daily. 09/09/21 09/09/22  [provider]  blood glucose meter kit and supplies by Other route once for 1 dose. Use as instructed 04/21/22   [provider]  cephALEXin (KEFLEX) 500 MG capsule Take 1 capsule (500 mg total) by mouth 3 (three) times daily. Patient not taking: Reported on 11/27/2022 10/28/22   Edwin Cap, DPM  hydrochlorothiazide (HYDRODIURIL) 25 MG tablet Take 1 tablet by mouth daily. 11/04/21 11/04/22  [provider]  lamoTRIgine (LAMICTAL) 100 MG tablet Take 1 tablet (100 mg total) by mouth 2 (two) times daily. Patient taking differently: Take 250 mg by mouth 2 (two) times daily.  03/13/18 07/09/20  Ihor Austin, MD  losartan (COZAAR) 50 MG tablet Take by mouth. 09/20/19 11/20/22  [provider]  ondansetron (ZOFRAN-ODT) 4 MG disintegrating tablet  ODT q4 hours prn nausea/vomit Patient not taking: Reported on 11/27/2022 11/26/22   Gilda Crease, MD  pregabalin (LYRICA) 100 MG capsule Take by mouth. 08/19/21 08/19/22  [provider]  topiramate (TOPAMAX) 100 MG tablet Take 1 tablet (100 mg total) by mouth 4 (four) times daily. Patient taking differently: Take 100 mg by mouth 2 (two) times daily.  03/13/18 07/09/20  Ihor Austin, MD    Physical Exam: Vitals:   11/27/22 0300 11/27/22 0345 11/27/22 0415 11/27/22 0430  BP: (!) 168/99 (!) 177/108 (!) 162/96 (!) 164/96  Pulse: (!) 111 (!) 108 (!) 108 (!) 107  Resp: 17 (!) Temp:      TempSrc:      SpO2: 95% 93% 92% 96%  Weight:        Constitutional: NAD, calm, comfortable Vitals:   11/27/22 0300 11/27/22 0345 11/27/22 0415 11/27/22 0430  BP: (!) 168/99 (!) 177/108 (!) 162/96 (!) 164/96  Pulse: (!) 111 (!) 108 (!) 108 (!) 107  Resp: 17 (!) Temp:      TempSrc:      SpO2: 95% 93% 92% 96%  Weight:       Eyes: PERRL, lids and conjunctivae  normal ENMT:  Mucous membranes are dry. Posterior pharynx clear of any exudate or lesions.Normal dentition.  Neck: normal, supple, no masses, no thyromegaly Respiratory: clear to auscultation bilaterally, no wheezing, no crackles. Normal respiratory effort. No accessory muscle use.  Cardiovascular: Regular rate and rhythm, no murmurs / rubs / gallops. No extremity edema. 2+ pedal pulses. No carotid bruits.  Abdomen: + epigastric tenderness, no masses palpated. No hepatosplenomegaly. Bowel sounds positive.  Musculoskeletal: no clubbing / cyanosis. No joint deformity upper and lower extremities. Good ROM, no contractures. Normal muscle tone.  Skin: no rashes, lesions, ulcers. No induration Neurologic: CN 2-12 grossly intact. Sensation intact,Strength 5/5 in all 4.  Psychiatric: Normal judgment and insight. Alert and oriented x 3. Normal mood.    Labs on Admission: I have personally reviewed following labs and imaging studies  CBC: Recent Labs  Lab 11/25/22 2140 11/27/22 0009  WBC 10.1 9.1  NEUTROABS 8.3* 6.8  HGB 12.5 11.5*  HCT 41.0 38.1  MCV 79.8* 80.0  PLT 270 258   Basic Metabolic Panel: Recent Labs  Lab 11/25/22 2140 11/27/22 0009  NA 134* 134*  K 3.6 3.8  CL 97* 98  CO2 24 23  GLUCOSE 288* 249*  BUN 16 16  CREATININE 1.00 0.77  CALCIUM 9.1 8.6*   GFR: Estimated Creatinine Clearance: 84.5 mL/min (by C-G formula based on SCr of 0.77 mg/dL). Liver Function Tests: Recent Labs  Lab 11/25/22 2140 11/27/22 0009  AST 23 24  ALT 20 19  ALKPHOS 90 91  BILITOT 0.9 0.7  PROT 8.4* 8.0  ALBUMIN 4.1 3.9   Recent Labs  Lab 11/25/22 2140 11/27/22 0009  LIPASE 67* 54*   No results for input(s): "AMMONIA" in the last 168 hours. Coagulation Profile: No results for input(s): "INR", "PROTIME" in the last 168 hours. Cardiac Enzymes: Recent Labs  Lab 11/27/22 0009  CKTOTAL 214   BNP (last 3 results) No results for input(s): "PROBNP" in the last 8760 hours. HbA1C: No  results for input(s): "HGBA1C" in the last 72 hours. CBG: Recent Labs  Lab 11/25/22 2129 11/27/22 0002  GLUCAP 257* 243*   Lipid Profile: No results for input(s): "CHOL", "HDL", "LDLCALC", "TRIG", "CHOLHDL", "LDLDIRECT" in the last 72 hours. Thyroid Function Tests: No results for input(s): "TSH", "T4TOTAL", "FREET4", "T3FREE", "THYROIDAB" in the last 72 hours. Anemia Panel: No results for input(s): "VITAMINB12", "FOLATE", "FERRITIN", "TIBC", "IRON", "RETICCTPCT" in the last 72 hours. Urine analysis:    Component Value Date/Time   COLORURINE YELLOW 11/26/2022 0300   APPEARANCEUR CLEAR 11/26/2022 0300   LABSPEC <=1.005 11/26/2022 0300   PHURINE 6.0 11/26/2022 0300   GLUCOSEU 150 (A) 11/26/2022 0300   HGBUR SMALL (A) 11/26/2022 0300   BILIRUBINUR NEGATIVE 11/26/2022 0300   KETONESUR 20 (A) 11/26/2022 0300   PROTEINUR >=300 (A) 11/26/2022 0300   NITRITE NEGATIVE 11/26/2022 0300   LEUKOCYTESUR NEGATIVE 11/26/2022 0300    Radiological Exams on Admission: CT Head Wo Contrast  Result Date: 11/27/2022 CLINICAL DATA:  Mental status change, unknown cause. EXAM: CT HEAD WITHOUT CONTRAST TECHNIQUE: Contiguous axial images were obtained from the base of the skull through the vertex without intravenous contrast. RADIATION DOSE REDUCTION: This exam was performed according to the departmental dose-optimization program which includes automated exposure control, adjustment of the mA and/or kV according to patient size and/or use of iterative reconstruction technique. COMPARISON:  05/29/2022 FINDINGS: Brain: No acute intracranial abnormality. Specifically, no hemorrhage, hydrocephalus, mass lesion, acute infarction, or significant intracranial injury. Vascular: No hyperdense vessel or unexpected calcification. Skull:  No acute calvarial abnormality. Sinuses/Orbits: No acute findings Other: None IMPRESSION: No acute intracranial abnormality. Electronically Signed   By: Charlett Nose M.D.   On: 11/27/2022  02:12   CT ABDOMEN PELVIS W CONTRAST  Result Date: 11/25/2022 CLINICAL DATA:  Abdominal pain with nausea and vomiting for 2 days, initial encounter EXAM: CT ABDOMEN AND PELVIS WITH CONTRAST TECHNIQUE: Multidetector CT imaging of the abdomen and pelvis was performed using the standard protocol following bolus administration of intravenous contrast. RADIATION DOSE REDUCTION: This exam was performed according to the departmental dose-optimization program which includes automated exposure control, adjustment of the mA and/or kV according to patient size and/or use of iterative reconstruction technique. CONTRAST:  OMNIPAQUE IOHEXOL 300 MG/ML  SOLN COMPARISON:  03/29/2022 FINDINGS: Lower chest: No acute abnormality. Hepatobiliary: Mild fatty infiltration of the liver is noted. The gallbladder is within normal limits. Pancreas: Unremarkable. No pancreatic ductal dilatation or surrounding inflammatory changes. Spleen: Normal in size without focal abnormality. Adrenals/Urinary Tract: Adrenal glands are within normal limits. Kidneys are well visualize within normal enhancement pattern. No renal calculi or obstructive changes are seen. The bladder is partially distended. Stomach/Bowel: Colon shows no obstructive or inflammatory changes. The appendix is within normal limits. Small bowel and stomach are within normal limits. Vascular/Lymphatic: Aortic atherosclerosis. No enlarged abdominal or pelvic lymph nodes. Reproductive: Uterus has been removed. Bilateral adnexal cysts are seen. The largest of these measures 3.6 cm on the right slightly enlarged when compared with the prior exam. Other: No abdominal wall hernia or abnormality. No abdominopelvic ascites. Musculoskeletal: No acute or significant osseous findings. IMPRESSION: Bilateral adnexal cysts measuring up to 3.6 cm. These are slightly more prominent than that seen on the prior exam. No follow-up imaging recommended. Note: This recommendation does not apply to  premenarchal patients and to those with increased risk (genetic, family history, elevated tumor markers or other high-risk factors) of ovarian cancer. Reference: JACR 2020 Feb; 17(2):248-254 Fatty liver. No other focal abnormality is noted. Electronically Signed   By: Alcide Clever M.D.   On: 11/25/2022 23:42    EKG: Independently reviewed.   Assessment/Plan   Breakthrough Seizure  -s/p load with keppra, and vimpat  in ED -continue keppra iv  1500mg  bid and admit vimpat 100 mg iv bid as per neurology rec -can transition back to her home po regimen once patient able to tolerate po  -place no sz precautions   Abd/pain  N/V -possible gastroenteritis vs gastroparesis vs med side-effect of ozempic -hold ozempic -continue supportive anti-emetic, trial of reglan  -supportive care ivfs  -ct  abn imaging negative one day prior  GERD /gastritis flare -due to n/v  - protonix iv  -carfate as tolerated   Asthma -nebs prn  -no acute flare     DMII, uncontrolled  -iss/fs  -resume lantus 15 untis      Ehlers-Danlos disease -no active issues    Hypertension -resume home regimen as able  -hold lasix and hctz currently  -prn hydralazine    DVT prophylaxis: heparin Code Status: full/ as discussed per patient wishes in event of cardiac arrest  Family Communication:   Marcelle Overlie (Significant other) [phone] (Mobile)  At bedside  plan of care discussed with patient and family  Disposition Plan: patient  expected to be admitted greater than 2 midnights  Consults called: neurology Dr April Manson Admission status: med tele  Lurline Del MD [facility]   If 7PM-7AM, please contact night-coverage [email or web address] Password [facility]  11/27/2022, 4:51 AM

## 2022-11-27 NOTE — Progress Notes (Signed)
  Transition of Care (TOC) Screening Note   Patient Details  Name: Sara [name] Date of Birth: [birth year]   Transition of Care [facility]) CM/SW Contact:    Lanier Clam, RN Phone Number: 11/27/2022, 1:49 PM    Transition of Care Department [facility]) has reviewed patient and no TOC needs have been identified at this time. We will continue to monitor patient advancement through interdisciplinary progression rounds. If new patient transition needs arise, please place a TOC consult.

## 2022-11-27 NOTE — Hospital Course (Addendum)
Sara [name] is a 47 y.o. female with a history of diabetes mellitus type 2, Ehlers-Danlos, hypertension, migraine, stroke, seizure disorder. Patient presented secondary to nausea/vomiting in addition to seizure episode. Patient admitted and started on IV fluids and antiemetics. IV Keppra and Vimpat started.

## 2022-11-27 NOTE — ED Triage Notes (Signed)
Pt in from home via [facility] with n/v x 1 week per husband, unable to keep meds down. Pt and husband also reporting seizure activity. Seen yesterday for similar symptoms, n/v persists

## 2022-11-27 NOTE — ED Provider Notes (Signed)
[facility] EMERGENCY DEPARTMENT AT [facility] Provider Note   CSN: [id number] Arrival date & time: 11/26/22  2351     History  Chief Complaint  Patient presents with   Emesis   Seizures    Sara [name] is a 47 y.o. female.  HPI     This is a 47 year old female with a history of diabetes and seizures who presents with reported seizure activity and ongoing nausea and vomiting.  Was seen and evaluated for the same.  She had lab work as well as a CT scan that was unremarkable.  She is not in DKA.  CT scan did not show any intra-abdominal process.  She was loaded with her Keppra.  Per EMS report, has been reported ongoing seizure-like activity and nausea and vomiting.  Patient is confused on exam.  Unsure whether she had a seizure in route.  Denies any pain.  Limited history secondary to likely postictal state.  Home Medications Prior to Admission medications   Medication Sig Start Date End Date Taking? Authorizing Provider  albuterol (PROVENTIL HFA;VENTOLIN HFA) 108 (90 Base) MCG/ACT inhaler Inhale 2 to 3 puffs into the lungs every 6 hours as needed (respiratory problems)    [provider]  ALPRAZolam Prudy Feeler) 1 MG tablet Take by mouth.    [provider]  amLODipine (NORVASC) 10 MG tablet Take 1 tablet by mouth daily. 09/09/21 09/09/22  [provider]  betamethasone dipropionate 0.05 % cream APPLY TOPICALLY TWICE DAILY 11/09/22   Edwin Cap, DPM  blood glucose meter kit and supplies by Other route once for 1 dose. Use as instructed 04/21/22   [provider]  Buprenorphine HCl-Naloxone HCl 8-2 MG FILM Place under the tongue 2 (two) times daily. 03/17/22   [provider]  Calcium Carbonate-Simethicone (MAALOX MAX) 1000-60 MG CHEW Chew by mouth. 03/17/22   [provider]  cephALEXin (KEFLEX) 500 MG capsule Take 1 capsule (500 mg total) by mouth 3 (three) times daily. 10/28/22   Edwin Cap, DPM   Cholecalciferol (VITAMIN D3) 50 MCG (2000 UT) CAPS Take by mouth daily.    [provider]  Cyanocobalamin (VITAMIN B-12 IJ) Inject as directed every 30 (thirty) days.    [provider]  cyclobenzaprine (FLEXERIL) 10 MG tablet Take 1 tablet (10 mg total) by mouth 3 (three) times daily as needed for muscle spasms. 03/29/22   Roxy Horseman, PA-C  cyclobenzaprine (FLEXERIL) 10 MG tablet TAKE 1 TABLET BY MOUTH NIGHTLY AS NEEDED FOR REST 01/13/22   [provider]  diclofenac (VOLTAREN) 75 MG EC tablet     [provider]  dicyclomine (BENTYL) 10 MG capsule Take 10 mg by mouth every 6 (six) hours. 03/18/22   [provider]  donepezil (ARICEPT) 10 MG tablet Take by mouth.    [provider]  doxycycline (VIBRAMYCIN) 100 MG capsule Take 100 mg by mouth 2 (two) times daily. 05/28/22   [provider]  famotidine (PEPCID) 40 MG tablet SMARTSIG:1 Tablet(s) By Mouth Every Evening 05/07/22   [provider]  FERROUS SULFATE PO Take by mouth daily.    [provider]  furosemide (LASIX) 40 MG tablet Take 40 mg by mouth daily. 11/04/21   [provider]  gabapentin (NEURONTIN) 100 MG capsule Take 100 mg by mouth 3 (three) times daily.    [provider]  gabapentin (NEURONTIN) 600 MG tablet Take by mouth.    [provider]  glucagon (GLUCAGEN) 1 MG SOLR  injection Inject 1 mg into the vein once as needed for low blood sugar.    [provider]  hydrochlorothiazide (HYDRODIURIL) 25 MG tablet Take 1 tablet by mouth daily. 11/04/21 11/04/22  [provider]  ibuprofen (ADVIL) 800 MG tablet Take 800 mg by mouth every 8 (eight) hours as needed. 01/13/22   [provider]  insulin aspart (NOVOLOG) 100 UNIT/ML injection For insulin pump - administration average 160u over course of day    [provider]  insulin detemir (LEVEMIR) 100 UNIT/ML injection Inject 40 Units into the skin  2 (two) times daily. (USE ONLY IF INSULIN PUMP FAILS)    [provider]  Insulin Glargine (TOUJEO SOLOSTAR Ellis) Inject 34 Units into the skin at bedtime.    [provider]  insulin lispro (HUMALOG) 100 UNIT/ML injection Inject into the skin 3 (three) times daily before meals. Sliding scale    [provider]  Lacosamide (VIMPAT) 150 MG TABS     [provider]  lamoTRIgine (LAMICTAL) 100 MG tablet Take 1 tablet (100 mg total) by mouth 2 (two) times daily. Patient taking differently: Take 250 mg by mouth 2 (two) times daily.  03/13/18 07/09/20  Ihor Austin, MD  lamoTRIgine (LAMICTAL) 100 MG tablet  04/26/20   [provider]  levETIRAcetam (KEPPRA) 750 MG tablet Take 750 mg by mouth 2 (two) times daily.    [provider]  levETIRAcetam (KEPPRA) 750 MG tablet Take by mouth. 01/13/22 01/13/23  [provider]  lisinopril (ZESTRIL) 10 MG tablet Take by mouth.    [provider]  loperamide (IMODIUM) 2 MG capsule Take by mouth. 03/18/22   [provider]  losartan (COZAAR) 25 MG tablet Take 25 mg by mouth daily.    [provider]  losartan (COZAAR) 50 MG tablet Take by mouth. 09/20/19 11/20/22  [provider]  methocarbamol (ROBAXIN) 750 MG tablet Take 1 tablet by mouth every 8 (eight) hours.    [provider]  montelukast (SINGULAIR) 10 MG tablet Take 10 mg by mouth at bedtime.    [provider]  Multiple Vitamins-Minerals (B COMPLEX-C-E-ZINC) tablet Take 1 tablet by mouth daily. 08/04/18   [provider]  ondansetron (ZOFRAN-ODT) 4 MG disintegrating tablet  ODT q4 hours prn nausea/vomit 11/26/22   Pollina, Canary Brim, MD  Oxycodone HCl 10 MG TABS Take 10 mg by mouth 3 (three) times daily. 05/07/22   [provider]  pregabalin (LYRICA) 100 MG capsule Take by mouth. 08/19/21 08/19/22  [provider]  prochlorperazine (COMPAZINE) 5 MG tablet Take by mouth.  03/18/22   [provider]  ropivacaine, PF, 5 mg/mL, 0.5%, (NAROPIN) 5 MG/ML injection Inject into the articular space. 07/14/21   [provider]  rosuvastatin (CRESTOR) 20 MG tablet Take 20 mg by mouth daily.    [provider]  Semaglutide,0.25 or 0.5MG /DOS, (OZEMPIC, 0.25 OR 0.5 MG/DOSE,) 2 MG/3ML SOPN INJECT 0.25 MG SUBCUTANEOUSLY  WEEKLY FOR 4 WEEKS, THEN 0.5 MG  WEEKLY THEREAFTER 02/16/22   [provider]  SENEXON-S 8.6-50 MG tablet Take 2 tablets by mouth daily. 03/17/22   [provider]  senna (SENOKOT) 8.6 MG tablet Take 1 tablet by mouth daily.    [provider]  topiramate (TOPAMAX) 100 MG tablet Take 1 tablet (100 mg total) by mouth 4 (four) times daily. Patient taking differently: Take 100 mg by mouth 2 (two) times daily.  03/13/18 07/09/20  Ihor Austin, MD  topiramate (TOPAMAX) 100  MG tablet     [provider]      Allergies    Other, Coconut (cocos nucifera), Coconut fatty acids, Latex, and Tape    Review of Systems   Review of Systems  Gastrointestinal:  Positive for nausea and vomiting.  Neurological:  Positive for seizures.  All other systems reviewed and are negative.   Physical Exam Updated Vital Signs BP (!) 170/100   Pulse (!) 113   Temp 98.1 F (36.7 C) (Axillary)   Resp 13   Wt 77.1 kg   SpO2 94%   BMI 31.09 kg/m  Physical Exam Vitals and nursing note reviewed.  Constitutional:      Appearance: She is well-developed. She is obese.  HENT:     Head: Normocephalic and atraumatic.  Eyes:     Pupils: Pupils are equal, round, and reactive to light.     Comments: Pulse 2 mm reactive bilaterally  Cardiovascular:     Rate and Rhythm: Regular rhythm. Tachycardia present.     Heart sounds: Normal heart sounds.  Pulmonary:     Effort: Pulmonary effort is normal. No respiratory distress.     Breath sounds: No wheezing.  Abdominal:     General: Bowel sounds are normal.     Palpations: Abdomen  is soft.  Musculoskeletal:     Cervical back: Neck supple.  Skin:    General: Skin is warm and dry.  Neurological:     Mental Status: She is alert.     Comments: Confused, follows simple commands, appears to move all 4 extremities  Psychiatric:        Mood and Affect: Mood normal.     ED Results / Procedures / Treatments   Labs (all labs ordered are listed, but only abnormal results are displayed) Labs Reviewed  CBC WITH DIFFERENTIAL/PLATELET - Abnormal; Notable for the following components:      Result Value   Hemoglobin 11.5 (*)    MCH 24.2 (*)    Abs Immature Granulocytes 0.11 (*)    All other components within normal limits  COMPREHENSIVE METABOLIC PANEL - Abnormal; Notable for the following components:   Sodium 134 (*)    Glucose, Bld 249 (*)    Calcium 8.6 (*)    All other components within normal limits  LIPASE, BLOOD - Abnormal; Notable for the following components:   Lipase 54 (*)    All other components within normal limits  CBG MONITORING, ED - Abnormal; Notable for the following components:   Glucose-Capillary 243 (*)    All other components within normal limits  CK    EKG EKG Interpretation  Date/Time:  Friday November 27 2022 00:06:00 EDT Ventricular Rate:  113 PR Interval:  128 QRS Duration: 94 QT Interval:  333 QTC Calculation: 457 R Axis:   34 Text Interpretation: Sinus tachycardia RSR' in V1 or V2, right VCD or RVH Confirmed by Ross Marcus ([id number]) on 11/27/2022 12:48:14 AM  Radiology CT ABDOMEN PELVIS W CONTRAST  Result Date: 11/25/2022 CLINICAL DATA:  Abdominal pain with nausea and vomiting for 2 days, initial encounter EXAM: CT ABDOMEN AND PELVIS WITH CONTRAST TECHNIQUE: Multidetector CT imaging of the abdomen and pelvis was performed using the standard protocol following bolus administration of intravenous contrast. RADIATION DOSE REDUCTION: This exam was performed according to the departmental dose-optimization program which includes  automated exposure control, adjustment of the mA and/or kV according to patient size and/or use of iterative reconstruction technique. CONTRAST:  OMNIPAQUE IOHEXOL 300 MG/ML  SOLN COMPARISON:  03/29/2022 FINDINGS: Lower chest: No acute abnormality. Hepatobiliary: Mild fatty infiltration of the liver is noted. The gallbladder is within normal limits. Pancreas: Unremarkable. No pancreatic ductal dilatation or surrounding inflammatory changes. Spleen: Normal in size without focal abnormality. Adrenals/Urinary Tract: Adrenal glands are within normal limits. Kidneys are well visualize within normal enhancement pattern. No renal calculi or obstructive changes are seen. The bladder is partially distended. Stomach/Bowel: Colon shows no obstructive or inflammatory changes. The appendix is within normal limits. Small bowel and stomach are within normal limits. Vascular/Lymphatic: Aortic atherosclerosis. No enlarged abdominal or pelvic lymph nodes. Reproductive: Uterus has been removed. Bilateral adnexal cysts are seen. The largest of these measures 3.6 cm on the right slightly enlarged when compared with the prior exam. Other: No abdominal wall hernia or abnormality. No abdominopelvic ascites. Musculoskeletal: No acute or significant osseous findings. IMPRESSION: Bilateral adnexal cysts measuring up to 3.6 cm. These are slightly more prominent than that seen on the prior exam. No follow-up imaging recommended. Note: This recommendation does not apply to premenarchal patients and to those with increased risk (genetic, family history, elevated tumor markers or other high-risk factors) of ovarian cancer. Reference: JACR 2020 Feb; 17(2):248-254 Fatty liver. No other focal abnormality is noted. Electronically Signed   By: Alcide Clever M.D.   On: 11/25/2022 23:42    Procedures .Critical Care  Performed by: Shon Baton, MD Authorized by: Shon Baton, MD   Critical care provider statement:    Critical care  time (minutes):  31   Critical care was necessary to treat or prevent imminent or life-threatening deterioration of the following conditions:  CNS failure or compromise (Recurrent seizures)   Critical care was time spent personally by me on the following activities:  Development of treatment plan with patient or surrogate, discussions with consultants, evaluation of patient's response to treatment, examination of patient, ordering and review of laboratory studies, ordering and review of radiographic studies, ordering and performing treatments and interventions, pulse oximetry, re-evaluation of patient's condition and review of old charts     Medications Ordered in ED Medications  levETIRAcetam (KEPPRA) IVPB 1000 mg/100 mL premix (has no administration in time range)  lacosamide (VIMPAT) 100 mg in sodium chloride 0.9 % 25 mL IVPB (has no administration in time range)  sodium chloride 0.9 % bolus 1,000 mL (1,000 mLs Intravenous New Bag/Given 11/27/22 0020)  ondansetron (ZOFRAN) injection 4 mg (4 mg Intravenous Given 11/27/22 0020)  LORazepam (ATIVAN) injection 1 mg (1 mg Intravenous Given 11/27/22 0055)    ED Course/ Medical Decision Making/ A&P Clinical Course as of 11/27/22 0203  Fri Nov 27, 2022  0058 I was called to the room for seizure activity.  Patient appeared to be having a full tonic-clonic seizure.  After this stops, she had some ongoing contracture of the right upper extremity.  Daughter states that this is normal after her seizures.  She confirmed that she has been unable to keep down any seizure medication today.  Unknown whether she has a history of gastroparesis. [CH]  0104 2 EEGs reviewed from [facility] 1 in 2022 and 1 in 2014.  Both read as normal. [CH]  [id number] Poke with neurology, Dr. Derry Lory.  Recommends continuing Keppra.  Would also start on Vimpat.  Recommeneds Vimpat 50 mg BID.  Obs admission.  If episodes of seizure-like activity continue, will need neurology consult and  likely EEG. [CH]    Clinical Course User Index [CH] Aubreanna Percle, Mayer Masker, MD  Medical Decision Making Amount and/or Complexity of Data Reviewed Labs: ordered. Radiology: ordered.  Risk Prescription drug management. Decision regarding hospitalization.   This patient presents to the ED for concern of nausea, vomiting, seizure-like activity, this involves an extensive number of treatment options, and is a complaint that carries with it a high risk of complications and morbidity.  I considered the following differential and admission for this acute, potentially life threatening condition.  The differential diagnosis includes hydration, metabolic derangement such as DKA, seizure  MDM:    This is a 47 year old female with a history of seizures and diabetes who presents with reported recurrent seizures.  She is confused on my initial evaluation.  Questionable seizure in route.  She is nonfocal otherwise.  Daughter and fianc both confirm that she has had nausea and vomiting and has not been able to keep her seizure medication down since discharge yesterday.  She was loaded with Keppra yesterday prior to discharge.  She also had a CT scan that was unremarkable.  No known history of gastroparesis.  Patient had another event while in the ED.  This was witnessed by nursing.  She was given a dose of Ativan.  Labs obtained did not show any anion gap.  She has mild hyper glycemia.  There was some question by nursing whether the seizure may have been a pseudoseizure.  CK is actually normal at 214.  While this does not exclude seizure activity, it does argue against status and prolonged recurrent seizures.  Discussed with neurology.  Recommends obs admission.  Add Vimpat.  If patient continues to have these episodes, will need formal neurology evaluation and EEG.  Patient was loaded with IV Keppra and IV Vimpat.  Fianc updated at the bedside.  (Labs, imaging, consults)  Labs: I  Ordered, and personally interpreted labs.  The pertinent results include: CBC, CMP, lipase, CK  Imaging Studies ordered: I ordered imaging studies including CT head pending I independently visualized and interpreted imaging. I agree with the radiologist interpretation  Additional history obtained from daughter and fianc.  External records from outside source obtained and reviewed including her EEGs  Cardiac Monitoring: The patient was maintained on a cardiac monitor.  If on the cardiac monitor, I personally viewed and interpreted the cardiac monitored which showed an underlying rhythm of: Sinus tachycardia  Reevaluation: After the interventions noted above, I reevaluated the patient and found that they have :stayed the same  Social Determinants of Health:  lives independently  Disposition: Admit  Co morbidities that complicate the patient evaluation  Past Medical History:  Diagnosis Date   Adopted    Arthritis    hips, lower back   Asthma    Blind left eye    Complication of anesthesia    Was able to feel procedure during neck surgery   Diabetes mellitus without complication    Ehlers-Danlos disease    Epilepsy    Hypertension    Migraine headache    approx 6x yr (now getting injections from neurology)   Seizures    most recent 05/2020   Stroke 10/2020     Medicines Meds ordered this encounter  Medications   sodium chloride 0.9 % bolus 1,000 mL   ondansetron (ZOFRAN) injection 4 mg   LORazepam (ATIVAN) injection 1 mg   levETIRAcetam (KEPPRA) IVPB 1000 mg/100 mL premix   lacosamide (VIMPAT) 100 mg in sodium chloride 0.9 % 25 mL IVPB    I have reviewed the patients home medicines and have made  adjustments as needed  Problem List / ED Course: Problem List Items Addressed This Visit   None Visit Diagnoses     Seizures    -  Primary   Relevant Medications   levETIRAcetam (KEPPRA) IVPB 1000 mg/100 mL premix   lacosamide (VIMPAT) 100 mg in sodium chloride 0.9 %  25 mL IVPB   Vomiting, unspecified vomiting type, unspecified whether nausea present                       Final Clinical Impression(s) / ED Diagnoses Final diagnoses:  Seizures  Vomiting, unspecified vomiting type, unspecified whether nausea present    Rx / DC Orders ED Discharge Orders     None         Shon Baton, MD 11/27/22 0206

## 2022-11-27 NOTE — ED Notes (Signed)
Patient transported to CT 

## 2022-11-27 NOTE — ED Notes (Addendum)
While this RN and pt's daughter present in room, pt had seizure-like activity for approximately 1 min. Pt turned to side, 1mg  of Ativan administered, Dr. Wilkie Aye present at bedside. Continuing to monitor, pt currently post-ictal.

## 2022-11-28 DIAGNOSIS — R1110 Vomiting, unspecified: Secondary | ICD-10-CM | POA: Diagnosis not present

## 2022-11-28 DIAGNOSIS — G40909 Epilepsy, unspecified, not intractable, without status epilepticus: Secondary | ICD-10-CM

## 2022-11-28 LAB — CBC
HCT: 33.1 % — ABNORMAL LOW (ref 36.0–46.0)
Hemoglobin: 10.1 g/dL — ABNORMAL LOW (ref 12.0–15.0)
MCH: 24.6 pg — ABNORMAL LOW (ref 26.0–34.0)
MCHC: 30.5 g/dL (ref 30.0–36.0)
MCV: 80.7 fL (ref 80.0–100.0)
Platelets: 202 10*3/uL (ref 150–400)
RBC: 4.1 MIL/uL (ref 3.87–5.11)
RDW: 13.3 % (ref 11.5–15.5)
WBC: 6.6 10*3/uL (ref 4.0–10.5)
nRBC: 0 % (ref 0.0–0.2)

## 2022-11-28 LAB — GLUCOSE, CAPILLARY
Glucose-Capillary: 187 mg/dL — ABNORMAL HIGH (ref 70–99)
Glucose-Capillary: 180 mg/dL — ABNORMAL HIGH (ref 70–99)
Glucose-Capillary: 134 mg/dL — ABNORMAL HIGH (ref 70–99)
Glucose-Capillary: 190 mg/dL — ABNORMAL HIGH (ref 70–99)

## 2022-11-28 MED ORDER — DIPHENHYDRAMINE HCL 50 MG/ML IJ SOLN
25.0000 mg | Freq: Once | INTRAMUSCULAR | Status: AC
  Administered 2022-11-28: 25 mg via INTRAVENOUS
  Filled 2022-11-28: qty 1

## 2022-11-28 MED ORDER — LAMOTRIGINE 25 MG PO TABS
250.0000 mg | ORAL_TABLET | Freq: Two times a day (BID) | ORAL | Status: DC
  Administered 2022-11-28: 250 mg via ORAL
  Filled 2022-11-28: qty 2

## 2022-11-28 MED ORDER — LACOSAMIDE 50 MG PO TABS: 100.0000 mg | ORAL_TABLET | Freq: Two times a day (BID) | ORAL | Status: DC

## 2022-11-28 MED ORDER — TOPIRAMATE 100 MG PO TABS
100.0000 mg | ORAL_TABLET | Freq: Four times a day (QID) | ORAL | Status: DC
  Administered 2022-11-28 (×2): 100 mg via ORAL
  Filled 2022-11-28 (×2): qty 1

## 2022-11-28 MED ORDER — LEVETIRACETAM 750 MG PO TABS
1500.0000 mg | ORAL_TABLET | Freq: Two times a day (BID) | ORAL | Status: DC
  Administered 2022-11-28 – 2022-11-30 (×4): 1500 mg via ORAL
  Filled 2022-11-28 (×4): qty 3

## 2022-11-28 MED ORDER — INSULIN GLARGINE-YFGN 100 UNIT/ML ~~LOC~~ SOLN
15.0000 [IU] | Freq: Every day | SUBCUTANEOUS | Status: DC
  Administered 2022-11-28 – 2022-12-01 (×4): 15 [IU] via SUBCUTANEOUS
  Filled 2022-11-28 (×6): qty 0.15

## 2022-11-28 MED ORDER — MUSCLE RUB 10-15 % EX CREA
TOPICAL_CREAM | CUTANEOUS | Status: DC | PRN
  Filled 2022-11-28: qty 85

## 2022-11-28 MED ORDER — DIPHENHYDRAMINE HCL 25 MG PO CAPS
25.0000 mg | ORAL_CAPSULE | Freq: Four times a day (QID) | ORAL | Status: DC | PRN
  Administered 2022-11-28: 25 mg via ORAL
  Filled 2022-11-28: qty 1

## 2022-11-28 MED ORDER — LACOSAMIDE 200 MG PO TABS
200.0000 mg | ORAL_TABLET | Freq: Two times a day (BID) | ORAL | Status: DC
  Administered 2022-11-28 – 2022-11-30 (×4): 200 mg via ORAL
  Filled 2022-11-28 (×4): qty 4

## 2022-11-28 MED ORDER — TOPIRAMATE 100 MG PO TABS
100.0000 mg | ORAL_TABLET | Freq: Three times a day (TID) | ORAL | Status: DC
  Administered 2022-11-28 – 2022-12-02 (×11): 100 mg via ORAL
  Filled 2022-11-28 (×11): qty 1

## 2022-11-28 MED ORDER — KETOROLAC TROMETHAMINE 15 MG/ML IJ SOLN
15.0000 mg | Freq: Once | INTRAMUSCULAR | Status: AC
  Administered 2022-11-28: 15 mg via INTRAVENOUS
  Filled 2022-11-28: qty 1

## 2022-11-28 NOTE — Progress Notes (Addendum)
    Patient Name: Sara [name]           DOB: [birth year]  MRN: [id number]      Admission Date: 11/26/2022  Attending Provider: Narda Bonds, MD  Primary Diagnosis: Seizure disorder   Level of care: Progressive    CROSS COVER NOTE   Date of Service   11/28/2022   Sara [name], 47 y.o. female, was admitted on 11/26/2022 for Seizure disorder.    HPI/Events of Note   Nursing staff reports patient had seizure activity lasting ~ 4 minutes.  PRN IV Ativan was given.  Seizure immediately ceased after Ativan administration.  During bedside assessment, patient is postictal and stable.  Protecting airway.  She wakes up easily to voice command and is only alert to self.  Patient is able to follow simple commands.continue Q4 neuro checks.   Interventions/ Plan   x        Anthoney Harada, DNP, ACNPC- [facility] [place]

## 2022-11-28 NOTE — Progress Notes (Signed)
   11/28/22 0203  Provider Notification  Provider Name/Title Cammy Copa, NP  Date Provider Notified 11/28/22  Time Provider Notified 0204  Method of Notification Page  Notification Reason Other (Comment) (seizure activity)  Provider response At bedside (on-call on bedside, assissted, 4 mg prn ativan given.)  Date of Provider Response 11/28/22  Time of Provider Response [phone]

## 2022-11-28 NOTE — Consult Note (Signed)
Neurology Consultation Reason for Consult: seizures Referring Physician: Dr. Caleb Popp  CC: seizures  History is obtained from:patient, chart, daughter at bedside  HPI: Sara [name] is a 47 y.o. female with a history of diabetes mellitus type 2, Ehlers-Danlos, hypertension, migraine, stroke, seizure disorder. Patient presented secondary to nausea/vomiting in addition to seizure episode. Patient admitted and started on IV fluids and antiemetics. IV Keppra and Vimpat started. Patient with two seizures during hospitalization.  Neurology was consulted.  Patient was diagnosed with H. Pylori and had endoscopy in March. She was put back on Ozempic on Monday 4/8 and started having nausea and vomiting that night. Her last dose of Keppra was on Sunday 4/7 pm. She came to [facility] ED 4/10 after 3 days of projectile vomiting, unable to eat or hold medications down, Dexcom reading 299 on arrival . Patient had 2 seizures while in the ED. Per chart, for the second seizure lasted about 15 seconds; her eyes were closed, having generalized convulsions and immediately stopped seizing and was responsive when her eye were opened for her. She was noted to be speaking to doctor immediately afterwards, without any noted postictal period. She was loaded with Keppra and discharged 4/12am. Patient was BIB EMS just a couple hours later due to continued vomiting and falling.  On exam, patient is alert and oriented, moving all extremities, c/o 8/10 headache.   Patient clarified that she is on gabapentin  bid, topiramate  tid, and keppra  bid. She is no longer taking lamictal.  ROS: A 14 point ROS was performed and is negative except as noted in the HPI.    Past Medical History:  Diagnosis Date   Adopted    Arthritis    hips, lower back   Asthma    Blind left eye    Complication of anesthesia    Was able to feel procedure during neck surgery   Diabetes mellitus without complication    Ehlers-Danlos  disease    Epilepsy    Hypertension    Migraine headache    approx 6x yr (now getting injections from neurology)   Seizures    most recent 05/2020   Stroke 10/2020    Family History  Problem Relation Age of Onset   Hypertension Other     Social History:  reports that she has been smoking cigarettes. She has been smoking an average of .33 packs per day. She has never used smokeless tobacco. She reports current alcohol use of about 1.0 standard drink of alcohol per week. She reports that she does not use drugs.  Exam: Current vital signs: BP 131/64 (BP Location: Left Leg)   Pulse 90   Temp 98.4 F (36.9 C) (Oral)   Resp 16   Ht  (1.575 m)   Wt 86.9 kg   SpO2 94%   BMI 35.04 kg/m  Vital signs in last 24 hours: Temp:  [97.7 F (36.5 C)-98.5 F (36.9 C)] 98.4 F (36.9 C) (04/13 0536) Pulse Rate:  [90-106] 90 (04/13 0536) Resp:  [16-19] 16 (04/13 0536) BP: (89-131)/(49-85) 131/64 (04/13 0707) SpO2:  [94 %-100 %] 94 % (04/13 0536) Weight:  [86.9 kg] 86.9 kg (04/13 0500)   Physical Exam  Appears well-developed and well-nourished.   Neuro: Mental Status: Patient is awake, alert, oriented to person, place, month, year, and situation. Patient is able to give a clear and coherent history. No signs of aphasia or neglect Cranial Nerves: II: Reduced VF fields, narrowed peripherals. Left eye is blind and  unable to assess.  III,IV, VI: EOMI without ptosis. Reported blurry vision constantly V: Facial sensation is symmetric to temperature VII: Facial movement is symmetric.  VIII: hearing is intact to voice X: Uvula elevates symmetrically XI: Shoulder shrug is symmetric. XII: tongue is midline without atrophy or fasciculations.  Motor: Tone is normal. Bulk is normal. No focal weakness, no drift.  Sensory: Reduced sensation in right ring and pinky finger and in tips of toes bilaterally. Patient states this has happened after seizure in the past.  Cerebellar: FNF and HKS  are intact bilaterally   I have reviewed labs in epic and the results pertinent to this consultation are: CBG on arrival:247, today: 180  I have reviewed the images obtained:  CTH: negative  Impression:  Sara [name] is a 47 y.o. female with a history of  DM2, Ehlers-Danlos, hypertension, migraine, stroke, seizure disorder. Patient presented secondary to nausea/vomiting in addition to seizure episode. Patient admitted and started on IV fluids and antiemetics. IV Keppra and Vimpat started. Patient with three seizures during hospitalization, including one this morning.   Lamictal was started this morning, we will stop this as starting Lamictal as dosage after patient has not been on it at home has a strong risk of Stevens-Johnson Syndrome.  We will increase her Vimpat to 200 mg twice daily.  She will continue on Keppra and gabapentin.  Will also adjust her topiramate to 100 mg 3 times daily which is her correct home dose.  If she does not have any seizures tomorrow it would be reasonable to discharge her on Monday if she does not have any other medical issues requiring continued hospitalization.  She has no focal deficits indicating stroke as a possible provoking factor however if she has any further seizures she should undergo MRI brain without contrast (she already had an MRI brain with and without contrast in December).  Recommendations: - Continue keppra 1500mg  bid - Increase vimpat to 200mg  bid - Stop lamotrigine - Decrease topiramate to 100mg  tid - Continue gabapentin 900mg  q 8 hrs - Headache cocktail - Avoid triptans in post-stroke patient  - laxatives to encourage BM and avoid straining  Pt seen by Neuro NP/APP and later by MD. Note/plan to be edited by MD as needed.    Lynnae January, DNP, AGACNP-BC [facility] Please use AMION for pager and EPIC for messaging   Attending Neurohospitalist Addendum Patient seen and examined with APP/Resident. Agree  with the history and physical as documented above. Agree with the plan as documented, which I helped formulate. I have edited the note above to reflect my full findings and recommendations. I have independently reviewed the chart, obtained history, review of systems and examined the patient.I have personally reviewed pertinent head/neck/spine imaging (CT/MRI). Please feel free to call with any questions.  -- Bing Neighbors, MD [facility] [phone]  If 7pm- 7am, please page neurology on call as listed in AMION.

## 2022-11-28 NOTE — Progress Notes (Signed)
PROGRESS NOTE    Sara [name]  WUJ:[id number] DOB: 08/28/1975 DOA: 11/26/2022 PCP: Teresita Madura, MD   Brief Narrative: Sara [name] is a 47 y.o. female with a history of diabetes mellitus type 2, Ehlers-Danlos, hypertension, migraine, stroke, seizure disorder. Patient presented secondary to nausea/vomiting in addition to seizure episode. Patient admitted and started on IV fluids and antiemetics. IV Keppra and Vimpat started. Patient with two seizures during hospitalization.   Assessment and Plan:  Seizure disorder Secondary to history of TBI. Patient with seizures secondary to medication non-adherence which was secondary to inability to tolerate oral intake from intractable nausea/vomiting. Patient with two episodes of seizure activity during this hospitalization on 4/12. -Continue Lamictal, Topamax, Keppra and Vimpat -Neurology recommendations pending  Abdominal pain Secondary vomiting. Improved.  Headache Chronic issue. -Resume Topamax  GERD -Protonix  Asthma Stable. -Continue albuterol PRN  Diabetes mellitus type 2 Uncontrolled with hyperglycemia. Hemoglobin A1C of 7.7%. Patient is on Ozempic as an outpatient which was recently increased to 1 mg weekly. Started on [place] and SSI on admission. -Continue SSI -Resume Semglee now that she is eating better  Ehlers-Danlos disease Noted  Hyponatremia Mild. Likely secondary to poor oral intake and dehydration. Patient is also on hydrochlorothiazide which could contribute.  Primary hypertension Patient is on amlodipine, losartan and hydrochlorothiazide as an outpatient. -Continue amlodipine and losartan -Hold hydrochlorothiazide secondary to hyponatremia  DVT prophylaxis: Heparin subq Code Status:   Code Status: Full Code Family Communication: None at bedside Disposition Plan: Discharge home likely in 1-2 days once able to tolerate oral intake and neurology recommendations   Consultants:   Neurology  Procedures:  None  Antimicrobials: None    Subjective: Patient reports headache today. No other issues this morning.  Objective: BP 131/64 (BP Location: Left Leg)   Pulse 90   Temp 98.4 F (36.9 C) (Oral)   Resp 16   Ht  (1.575 m)   Wt 86.9 kg   SpO2 94%   BMI 35.04 kg/m   Examination:  General exam: Appears calm and comfortable Respiratory system: Clear to auscultation. Respiratory effort normal. Cardiovascular system: S1 & S2 heard, RRR. No murmurs, rubs, gallops or clicks. Gastrointestinal system: Abdomen is nondistended, soft and nontender. Normal bowel sounds heard. Central nervous system: Alert and oriented. No focal neurological deficits. Musculoskeletal: No edema. No calf tenderness Skin: No cyanosis. No rashes Psychiatry: Judgement and insight appear normal. Mood & affect appropriate.    Data Reviewed: I have personally reviewed following labs and imaging studies  CBC Lab Results  Component Value Date   WBC 6.6 11/28/2022   RBC 4.10 11/28/2022   HGB 10.1 (L) 11/28/2022   HCT 33.1 (L) 11/28/2022   MCV 80.7 11/28/2022   MCH 24.6 (L) 11/28/2022   PLT 202 11/28/2022   MCHC 30.5 11/28/2022   RDW 13.3 11/28/2022   LYMPHSABS 1.4 11/27/2022   MONOABS 0.7 11/27/2022   EOSABS 0.1 11/27/2022   BASOSABS 0.0 11/27/2022     Last metabolic panel Lab Results  Component Value Date   NA 133 (L) 11/27/2022   K 3.6 11/27/2022   CL 101 11/27/2022   CO2 23 11/27/2022   BUN 12 11/27/2022   CREATININE 0.77 11/27/2022   GLUCOSE 213 (H) 11/27/2022   GFRNONAA >60 11/27/2022   GFRAA >60 03/12/2018   CALCIUM 8.1 (L) 11/27/2022   PHOS 2.9 11/27/2022   PROT 7.1 11/27/2022   ALBUMIN 3.5 11/27/2022   BILITOT 0.6 11/27/2022   ALKPHOS 79 11/27/2022   AST  16 11/27/2022   ALT 17 11/27/2022   ANIONGAP 9 11/27/2022    GFR: Estimated Creatinine Clearance: 89.9 mL/min (by C-G formula based on SCr of 0.77 mg/dL).  No results found for this or any  previous visit (from the past 240 hour(s)).    Radiology Studies: CT Head Wo Contrast  Result Date: 11/27/2022 CLINICAL DATA:  Mental status change, unknown cause. EXAM: CT HEAD WITHOUT CONTRAST TECHNIQUE: Contiguous axial images were obtained from the base of the skull through the vertex without intravenous contrast. RADIATION DOSE REDUCTION: This exam was performed according to the departmental dose-optimization program which includes automated exposure control, adjustment of the mA and/or kV according to patient size and/or use of iterative reconstruction technique. COMPARISON:  05/29/2022 FINDINGS: Brain: No acute intracranial abnormality. Specifically, no hemorrhage, hydrocephalus, mass lesion, acute infarction, or significant intracranial injury. Vascular: No hyperdense vessel or unexpected calcification. Skull: No acute calvarial abnormality. Sinuses/Orbits: No acute findings Other: None IMPRESSION: No acute intracranial abnormality. Electronically Signed   By: Charlett Nose M.D.   On: 11/27/2022 02:12      LOS: 1 day    Jacquelin Hawking, MD [facility] 11/28/2022, 12:29 PM   If 7PM-7AM, please contact night-coverage [email or web address]

## 2022-11-29 ENCOUNTER — Inpatient Hospital Stay (HOSPITAL_COMMUNITY): Payer: Medicare Other

## 2022-11-29 DIAGNOSIS — G40909 Epilepsy, unspecified, not intractable, without status epilepticus: Secondary | ICD-10-CM | POA: Diagnosis not present

## 2022-11-29 LAB — GLUCOSE, CAPILLARY
Glucose-Capillary: 168 mg/dL — ABNORMAL HIGH (ref 70–99)
Glucose-Capillary: 168 mg/dL — ABNORMAL HIGH (ref 70–99)
Glucose-Capillary: 176 mg/dL — ABNORMAL HIGH (ref 70–99)
Glucose-Capillary: 147 mg/dL — ABNORMAL HIGH (ref 70–99)

## 2022-11-29 MED ORDER — LORAZEPAM 2 MG/ML IJ SOLN
1.0000 mg | Freq: Once | INTRAMUSCULAR | Status: AC | PRN
  Administered 2022-11-29: 1 mg via INTRAMUSCULAR

## 2022-11-29 MED ORDER — LORAZEPAM 2 MG/ML IJ SOLN
INTRAMUSCULAR | Status: AC
  Filled 2022-11-29: qty 1

## 2022-11-29 MED ORDER — METHOCARBAMOL 500 MG PO TABS
500.0000 mg | ORAL_TABLET | Freq: Four times a day (QID) | ORAL | Status: DC | PRN
  Administered 2022-11-29 – 2022-11-30 (×3): 500 mg via ORAL
  Filled 2022-11-29 (×3): qty 1

## 2022-11-29 MED ORDER — DOCUSATE SODIUM 50 MG PO CAPS
50.0000 mg | ORAL_CAPSULE | Freq: Once | ORAL | Status: AC
  Administered 2022-11-30: 50 mg via ORAL
  Filled 2022-11-29: qty 1

## 2022-11-29 MED ORDER — LORAZEPAM 2 MG/ML IJ SOLN
1.0000 mg | Freq: Once | INTRAMUSCULAR | Status: AC | PRN
  Administered 2022-11-29: 1 mg via INTRAVENOUS
  Filled 2022-11-29: qty 1

## 2022-11-29 MED ORDER — LORAZEPAM 2 MG/ML IJ SOLN
1.0000 mg | INTRAMUSCULAR | Status: AC | PRN
  Administered 2022-11-30 (×2): 1 mg via INTRAVENOUS
  Filled 2022-11-29 (×2): qty 1

## 2022-11-29 NOTE — Progress Notes (Signed)
   11/29/22 1915  Provider Notification  Provider Name/Title Garner Nash NP  Date Provider Notified 11/29/22  Time Provider Notified 1910  Method of Notification  (secure chat)  Notification Reason Other (Comment) (loss of IV access with seizure activity, med route change requested)  Provider response See new orders  Date of Provider Response 11/29/22  Time of Provider Response 1915  Rapid Response Notification  Name of Rapid Response RN Notified Zoe RN  Date Rapid Response Notified 11/29/22  Time Rapid Response Notified 1910

## 2022-11-29 NOTE — Progress Notes (Signed)
   11/29/22 2143  Provider Notification  Provider Name/Title Chinita Greenland, NP  Date Provider Notified 11/29/22  Time Provider Notified 2142  Method of Notification  (secure chat)  Notification Reason Other (Comment) (seizure activity)  Provider response No new orders  Date of Provider Response 11/29/22  Time of Provider Response 2148

## 2022-11-29 NOTE — Progress Notes (Signed)
   11/29/22 0910  Pain Assessment  Pain Scale 0-10  Pain Score 9  Pain Type Acute pain  Pain Location Leg  Pain Orientation Left;Mid  Pain Descriptors / Indicators Aching  Pain Frequency Intermittent  Pain Onset Gradual  Patients Stated Pain Goal 1  Pain Intervention(s) MD notified (Comment);Emotional support (Patient family at bedside rubbing her leg, says it is disease related and massage helps the pain and tightness, MD made aware)  Provider Notification  Provider Name/Title Nettey  Date Provider Notified 11/29/22  Time Provider Notified [phone]  Method of Notification  (secure chat)  Notification Reason Other (Comment) (pt c/o left lower leg pain)

## 2022-11-29 NOTE — Plan of Care (Signed)
Please see neurology consult note from yesterday. MRI brain wo contrast showed no acute findings. If no further clinical seizure activity OK from neuro standpoint to discharge tomorrow on current AED regimen. Neurology will be available prn for questions.  Bing Neighbors, MD [facility] [phone]  If 7pm- 7am, please page neurology on call as listed in AMION.

## 2022-11-29 NOTE — Progress Notes (Signed)
PROGRESS NOTE    Sara [name]  ZOX:[id number] DOB: Sep 04, 1975 DOA: 11/26/2022 PCP: Teresita Madura, MD   Brief Narrative: Sara [name] is a 47 y.o. female with a history of diabetes mellitus type 2, Ehlers-Danlos, hypertension, migraine, stroke, seizure disorder. Patient presented secondary to nausea/vomiting in addition to seizure episode. Patient admitted and started on IV fluids and antiemetics. IV Keppra and Vimpat started. Patient with two seizures during hospitalization.   Assessment and Plan:  Seizure disorder Secondary to history of TBI. Patient with seizures secondary to medication non-adherence which was secondary to inability to tolerate oral intake from intractable nausea/vomiting. Patient with two episodes of seizure activity during this hospitalization on 4/12 and another episode  on 4/14 -Continue Topamax, Keppra and Vimpat; Lamictal discontinued -Neurology recommendations pending today -MRI brain  Abdominal pain Secondary vomiting. Improved.  Headache Chronic issue. Seems to be acutely worse. Improved with Toradol/Benadryl but returned. -Continue Topamax  GERD -Protonix  Asthma Stable. -Continue albuterol PRN  Diabetes mellitus type 2 Uncontrolled with hyperglycemia. Hemoglobin A1C of 7.7%. Patient is on Ozempic as an outpatient which was recently increased to 1 mg weekly. Started on [place] and SSI on admission. -Continue SSI -Continue Semglee now that she is eating better  Ehlers-Danlos disease Noted  Hyponatremia Mild. Likely secondary to poor oral intake and dehydration. Patient is also on hydrochlorothiazide which could contribute.  Blurry vision Right eye. No erythema. Non-painful.  Primary hypertension Patient is on amlodipine, losartan and hydrochlorothiazide as an outpatient. -Continue amlodipine and losartan -Hold hydrochlorothiazide secondary to hyponatremia  DVT prophylaxis: Heparin subq Code Status:   Code  Status: Full Code Family Communication: Fiance at bedside Disposition Plan: Discharge home likely in 1-2 days pending continued neurology recommendations   Consultants:  Neurology  Procedures:  None  Antimicrobials: None    Subjective: Patient reports leg cramps this morning which have subsided now. She reports a seizure overnight that occurred related to her headache. She also has noticed some blurry vision of her right eye.  Objective: BP 124/78 (BP Location: Left Arm)   Pulse (!) 102   Temp 98.1 F (36.7 C) (Oral)   Resp 20   Ht  (1.575 m)   Wt 87.2 kg   SpO2 100%   BMI 35.16 kg/m   Examination:  General exam: Appears calm and comfortable HEENT: EOMI, no conjunctivitis noted. No frontal/maxillary sinus tenderness Respiratory system: Clear to auscultation. Respiratory effort normal. Cardiovascular system: S1 & S2 heard, RRR. Gastrointestinal system: Abdomen is nondistended, soft and nontender. Normal bowel sounds heard. Central nervous system: Alert and oriented. No focal neurological deficits. Musculoskeletal: No edema. No calf tenderness Psychiatry: Judgement and insight appear normal. Mood & affect appropriate.    Data Reviewed: I have personally reviewed following labs and imaging studies  CBC Lab Results  Component Value Date   WBC 6.6 11/28/2022   RBC 4.10 11/28/2022   HGB 10.1 (L) 11/28/2022   HCT 33.1 (L) 11/28/2022   MCV 80.7 11/28/2022   MCH 24.6 (L) 11/28/2022   PLT 202 11/28/2022   MCHC 30.5 11/28/2022   RDW 13.3 11/28/2022   LYMPHSABS 1.4 11/27/2022   MONOABS 0.7 11/27/2022   EOSABS 0.1 11/27/2022   BASOSABS 0.0 11/27/2022     Last metabolic panel Lab Results  Component Value Date   NA 133 (L) 11/27/2022   K 3.6 11/27/2022   CL 101 11/27/2022   CO2 23 11/27/2022   BUN 12 11/27/2022   CREATININE 0.77 11/27/2022   GLUCOSE 213 (H)  11/27/2022   GFRNONAA >60 11/27/2022   GFRAA >60 03/12/2018   CALCIUM 8.1 (L) 11/27/2022   PHOS  2.9 11/27/2022   PROT 7.1 11/27/2022   ALBUMIN 3.5 11/27/2022   BILITOT 0.6 11/27/2022   ALKPHOS 79 11/27/2022   AST 16 11/27/2022   ALT 17 11/27/2022   ANIONGAP 9 11/27/2022    GFR: Estimated Creatinine Clearance: 90 mL/min (by C-G formula based on SCr of 0.77 mg/dL).  No results found for this or any previous visit (from the past 240 hour(s)).    Radiology Studies: No results found.    LOS: 2 days    Jacquelin Hawking, MD [facility] 11/29/2022, 11:37 AM   If 7PM-7AM, please contact night-coverage [email or web address]

## 2022-11-29 NOTE — Progress Notes (Signed)
   11/29/22 0224  Provider Notification  Provider Name/Title Chinita Greenland, NP  Date Provider Notified 11/29/22  Time Provider Notified [phone]  Method of Notification  (secure chat)  Notification Reason Other (Comment) (seizure activity)  Provider response See new orders  Date of Provider Response 11/29/22  Time of Provider Response [phone]

## 2022-11-30 ENCOUNTER — Inpatient Hospital Stay (HOSPITAL_COMMUNITY): Payer: Medicare Other

## 2022-11-30 DIAGNOSIS — G40909 Epilepsy, unspecified, not intractable, without status epilepticus: Secondary | ICD-10-CM | POA: Diagnosis not present

## 2022-11-30 LAB — GLUCOSE, CAPILLARY
Glucose-Capillary: 226 mg/dL — ABNORMAL HIGH (ref 70–99)
Glucose-Capillary: 220 mg/dL — ABNORMAL HIGH (ref 70–99)
Glucose-Capillary: 147 mg/dL — ABNORMAL HIGH (ref 70–99)
Glucose-Capillary: 189 mg/dL — ABNORMAL HIGH (ref 70–99)

## 2022-11-30 MED ORDER — LORAZEPAM 2 MG/ML IJ SOLN: 1.0000 mg | INTRAMUSCULAR | Status: DC | PRN

## 2022-11-30 MED ORDER — LORAZEPAM 2 MG/ML IJ SOLN: 1.0000 mg | Freq: Once | INTRAMUSCULAR | Status: DC | PRN

## 2022-11-30 MED ORDER — SODIUM CHLORIDE 0.9 % IV SOLN
200.0000 mg | Freq: Two times a day (BID) | INTRAVENOUS | Status: DC
  Administered 2022-11-30 – 2022-12-02 (×4): 200 mg via INTRAVENOUS
  Filled 2022-11-30 (×5): qty 20

## 2022-11-30 MED ORDER — LORAZEPAM 2 MG/ML IJ SOLN
2.0000 mg | Freq: Every day | INTRAMUSCULAR | Status: DC | PRN
  Administered 2022-11-30: 2 mg via INTRAVENOUS
  Filled 2022-11-30: qty 1

## 2022-11-30 MED ORDER — LEVETIRACETAM IN NACL 1500 MG/100ML IV SOLN
1500.0000 mg | Freq: Two times a day (BID) | INTRAVENOUS | Status: DC
  Administered 2022-11-30 – 2022-12-02 (×4): 1500 mg via INTRAVENOUS
  Filled 2022-11-30 (×5): qty 100

## 2022-11-30 MED ORDER — ORAL CARE MOUTH RINSE: 15.0000 mL | OROMUCOSAL | Status: DC | PRN

## 2022-11-30 NOTE — Progress Notes (Addendum)
PROGRESS NOTE    Sara [name]  WHQ:[id number] DOB: 1975-10-06 DOA: 11/26/2022 PCP: Teresita Madura, MD   Brief Narrative: Sara [name] is a 47 y.o. female with a history of diabetes mellitus type 2, Ehlers-Danlos, hypertension, migraine, stroke, seizure disorder. Patient presented secondary to nausea/vomiting in addition to seizure episode. Patient admitted and started on IV fluids and antiemetics. IV Keppra and Vimpat started. Patient with multiple and daily seizures during hospitalization.   Assessment and Plan:  Seizure disorder Secondary to history of TBI. Patient with seizures secondary to medication non-adherence which was secondary to inability to tolerate oral intake from intractable nausea/vomiting. Patient with two episodes of seizure activity during this hospitalization on 4/12 and another episode on 4/14 -Continue Topamax, Keppra and Vimpat; Lamictal discontinued -Neurology recommendations pending today -Transfer to [facility] for neurology follow-up and LTM EEG  Abdominal pain Secondary vomiting. Improved.  Headache Chronic issue. Seems to be acutely worse. Improved with Toradol/Benadryl but returned. -Continue Topamax  GERD -Protonix  Asthma Stable. -Continue albuterol PRN  Diabetes mellitus type 2 Uncontrolled with hyperglycemia. Hemoglobin A1C of 7.7%. Patient is on Ozempic as an outpatient which was recently increased to 1 mg weekly. Started on [place] and SSI on admission. -Continue SSI -Continue Semglee now that she is eating better  Ehlers-Danlos disease Noted.  Hyponatremia Mild. Likely secondary to poor oral intake and dehydration. Patient is also on hydrochlorothiazide which could contribute.  Blurry vision Right eye. No erythema. Non-painful.  Primary hypertension Patient is on amlodipine, losartan and hydrochlorothiazide as an outpatient. -Continue amlodipine and losartan -Hold hydrochlorothiazide  secondary to hyponatremia   DVT prophylaxis: Heparin subq Code Status:   Code Status: Full Code Family Communication: Fiance at bedside Disposition Plan: Discharge home likely in 1-2 days pending continued neurology recommendations   Consultants:  Neurology  Procedures:  None  Antimicrobials: None    Subjective: Patient with multiple seizures this morning responsive to Ativan and with short post-ictal period of about 10 minutes  Objective: BP 126/86 (BP Location: Right Arm)   Pulse (!) 106   Temp (!) 97.5 F (36.4 C) (Oral)   Resp 20   Ht 5\' 2"  (1.575 m)   Wt 90.2 kg   SpO2 100%   BMI 36.37 kg/m   Examination:  General exam: Appears calm and comfortable Respiratory system: Clear to auscultation. Respiratory effort normal. Cardiovascular system: S1 & S2 heard, RRR. No murmurs. Gastrointestinal system: Abdomen is nondistended, soft and nontender. Normal bowel sounds heard. Central nervous system: Alert and oriented.  Musculoskeletal: No edema. No calf tenderness Skin: No cyanosis. No rashes    Data Reviewed: I have personally reviewed following labs and imaging studies  CBC Lab Results  Component Value Date   WBC 6.6 11/28/2022   RBC 4.10 11/28/2022   HGB 10.1 (L) 11/28/2022   HCT 33.1 (L) 11/28/2022   MCV 80.7 11/28/2022   MCH 24.6 (L) 11/28/2022   PLT 202 11/28/2022   MCHC 30.5 11/28/2022   RDW 13.3 11/28/2022   LYMPHSABS 1.4 11/27/2022   MONOABS 0.7 11/27/2022   EOSABS 0.1 11/27/2022   BASOSABS 0.0 11/27/2022     Last metabolic panel Lab Results  Component Value Date   NA 133 (L) 11/27/2022   K 3.6 11/27/2022   CL 101 11/27/2022   CO2 23 11/27/2022   BUN 12 11/27/2022   CREATININE 0.77 11/27/2022   GLUCOSE 213 (H) 11/27/2022   GFRNONAA >60 11/27/2022   GFRAA >60 03/12/2018   CALCIUM 8.1 (L) 11/27/2022  PHOS 2.9 11/27/2022   PROT 7.1 11/27/2022   ALBUMIN 3.5 11/27/2022   BILITOT 0.6 11/27/2022   ALKPHOS 79 11/27/2022   AST 16  11/27/2022   ALT 17 11/27/2022   ANIONGAP 9 11/27/2022    GFR: Estimated Creatinine Clearance: 91.7 mL/min (by C-G formula based on SCr of 0.77 mg/dL).  No results found for this or any previous visit (from the past 240 hour(s)).    Radiology Studies: MR BRAIN WO CONTRAST  Result Date: 11/29/2022 CLINICAL DATA:  Seizure, refractory (Ped 0-17y) EXAM: MRI HEAD WITHOUT CONTRAST TECHNIQUE: Multiplanar, multiecho pulse sequences of the brain and surrounding structures were obtained without intravenous contrast. COMPARISON:  CT head November 27, 2022. FINDINGS: Brain: No acute infarction, hemorrhage, hydrocephalus, extra-axial collection or mass lesion. Mild for age scattered small T2/FLAIR hyperintensities within the white matter, nonspecific but compatible with mild chronic microvascular ischemic change. Hippocampi are symmetric and within normal limits. Vascular: Major arterial flow voids are maintained skull base. Skull and upper cervical spine: Normal marrow signal.  ACDF. Sinuses/Orbits: Clear sinuses.  No acute orbital findings. Other: No mastoid effusions. IMPRESSION: Unremarkable brain MRI for patient age.  No acute abnormality. Electronically Signed   By: Feliberto Harts M.D.   On: 11/29/2022 17:46      LOS: 3 days    Jacquelin Hawking, MD [facility] 11/30/2022, 12:02 PM   If 7PM-7AM, please contact night-coverage [email or web address]

## 2022-11-30 NOTE — Progress Notes (Signed)
Report called to receiving RN, Leta Jungling at [facility]. Verbalized understanding. Family updated about current plan of care. Currently awaiting [facility] arrival for transport. VSS.

## 2022-11-30 NOTE — Progress Notes (Signed)
Patient had a tonic clonic sezures when RN walked in the room lastin g 5 minutes.  IV ativan 2 mg given.   Patient was in postictal after IV ativan for 10 minute and then said she " drum' repeatedly in a lethargic state.  Vital signs taken temp 99.1 otherwise unremarkable.

## 2022-11-30 NOTE — Progress Notes (Signed)
   11/30/22 1709  Provider Notification  Provider Name/Title Dr. Caleb Popp  Date Provider Notified 11/30/22  Time Provider Notified 1708  Method of Notification Page  Notification Reason Change in status (seizure)  Provider response No new orders  Date of Provider Response 11/30/22  Time of Provider Response 1710

## 2022-11-30 NOTE — Progress Notes (Signed)
LTM EEG hooked up and running - no initial skin breakdown - push button tested - [facility] monitoring.  

## 2022-11-30 NOTE — Progress Notes (Signed)
   11/30/22 0856  Provider Notification  Provider Name/Title Dr. Caleb Popp  Date Provider Notified 11/30/22  Time Provider Notified [phone]  Method of Notification Page  Notification Reason Change in status (seizure)  Provider response See new orders  Date of Provider Response 11/30/22  Time of Provider Response [phone]

## 2022-11-30 NOTE — Progress Notes (Signed)
   11/30/22 9150  Provider Notification  Provider Name/Title Chinita Greenland, NP  Date Provider Notified 11/30/22  Time Provider Notified [phone]  Method of Notification  (secure chat)  Notification Reason Other (Comment) (seizure activity)  Provider response No new orders  Date of Provider Response 11/30/22  Time of Provider Response [phone]

## 2022-11-30 NOTE — Plan of Care (Signed)
  Problem: Health Behavior/Discharge Planning: Goal: Ability to manage health-related needs will improve Outcome: Progressing   Problem: Clinical Measurements: Goal: Cardiovascular complication will be avoided Outcome: Progressing   Problem: Pain Managment: Goal: General experience of comfort will improve Outcome: Progressing   Problem: Clinical Measurements: Goal: Ability to maintain clinical measurements within normal limits will improve Outcome: Adequate for Discharge   Problem: Education: Goal: Knowledge of General Education information will improve Description: Including pain rating scale, medication(s)/side effects and non-pharmacologic comfort measures Outcome: Completed/Met   Problem: Clinical Measurements: Goal: Will remain free from infection Outcome: Completed/Met   Problem: Nutrition: Goal: Adequate nutrition will be maintained Outcome: Completed/Met   Problem: Coping: Goal: Level of anxiety will decrease Outcome: Completed/Met   Problem: Safety: Goal: Ability to remain free from injury will improve Outcome: Completed/Met   Problem: Skin Integrity: Goal: Risk for impaired skin integrity will decrease Outcome: Completed/Met

## 2022-12-01 DIAGNOSIS — J4520 Mild intermittent asthma, uncomplicated: Secondary | ICD-10-CM

## 2022-12-01 DIAGNOSIS — G40909 Epilepsy, unspecified, not intractable, without status epilepticus: Secondary | ICD-10-CM | POA: Diagnosis not present

## 2022-12-01 DIAGNOSIS — G894 Chronic pain syndrome: Secondary | ICD-10-CM

## 2022-12-01 DIAGNOSIS — Q7962 Hypermobile Ehlers-Danlos syndrome: Secondary | ICD-10-CM

## 2022-12-01 DIAGNOSIS — R569 Unspecified convulsions: Secondary | ICD-10-CM | POA: Diagnosis not present

## 2022-12-01 DIAGNOSIS — K219 Gastro-esophageal reflux disease without esophagitis: Secondary | ICD-10-CM

## 2022-12-01 DIAGNOSIS — E1065 Type 1 diabetes mellitus with hyperglycemia: Secondary | ICD-10-CM | POA: Diagnosis not present

## 2022-12-01 LAB — IRON AND TIBC
Iron: 128 ug/dL (ref 28–170)
Saturation Ratios: 36 % — ABNORMAL HIGH (ref 10.4–31.8)
TIBC: 353 ug/dL (ref 250–450)
UIBC: 225 ug/dL

## 2022-12-01 LAB — FERRITIN: Ferritin: 161 ng/mL (ref 11–307)

## 2022-12-01 LAB — RETICULOCYTES
Immature Retic Fract: 8 % (ref 2.3–15.9)
RBC.: 4.75 MIL/uL (ref 3.87–5.11)
Retic Count, Absolute: 82.2 10*3/uL (ref 19.0–186.0)
Retic Ct Pct: 1.7 % (ref 0.4–3.1)

## 2022-12-01 LAB — COMPREHENSIVE METABOLIC PANEL
ALT: 39 U/L (ref 0–44)
AST: 35 U/L (ref 15–41)
Albumin: 3.4 g/dL — ABNORMAL LOW (ref 3.5–5.0)
Alkaline Phosphatase: 80 U/L (ref 38–126)
Anion gap: 8 (ref 5–15)
BUN: 15 mg/dL (ref 6–20)
CO2: 20 mmol/L — ABNORMAL LOW (ref 22–32)
Calcium: 9.1 mg/dL (ref 8.9–10.3)
Chloride: 108 mmol/L (ref 98–111)
Creatinine, Ser: 0.97 mg/dL (ref 0.44–1.00)
GFR, Estimated: >60 mL/min (ref >60)
Glucose, Bld: 173 mg/dL — ABNORMAL HIGH (ref 70–99)
Potassium: 3.7 mmol/L (ref 3.5–5.1)
Sodium: 136 mmol/L (ref 135–145)
Total Bilirubin: 0.4 mg/dL (ref 0.3–1.2)
Total Protein: 7.5 g/dL (ref 6.5–8.1)

## 2022-12-01 LAB — CBC
HCT: 37.3 % (ref 36.0–46.0)
Hemoglobin: 11.6 g/dL — ABNORMAL LOW (ref 12.0–15.0)
MCH: 24.3 pg — ABNORMAL LOW (ref 26.0–34.0)
MCHC: 31.1 g/dL (ref 30.0–36.0)
MCV: 78.2 fL — ABNORMAL LOW (ref 80.0–100.0)
Platelets: 219 10*3/uL (ref 150–400)
RBC: 4.77 MIL/uL (ref 3.87–5.11)
RDW: 13.3 % (ref 11.5–15.5)
WBC: 7.9 10*3/uL (ref 4.0–10.5)
nRBC: 0 % (ref 0.0–0.2)

## 2022-12-01 LAB — FOLATE: Folate: 14.6 ng/mL (ref >5.9)

## 2022-12-01 LAB — MAGNESIUM: Magnesium: 2.2 mg/dL (ref 1.7–2.4)

## 2022-12-01 LAB — PHOSPHORUS: Phosphorus: 3.6 mg/dL (ref 2.5–4.6)

## 2022-12-01 LAB — GLUCOSE, CAPILLARY
Glucose-Capillary: 164 mg/dL — ABNORMAL HIGH (ref 70–99)
Glucose-Capillary: 162 mg/dL — ABNORMAL HIGH (ref 70–99)
Glucose-Capillary: 196 mg/dL — ABNORMAL HIGH (ref 70–99)

## 2022-12-01 LAB — CK: Total CK: 65 U/L (ref 38–234)

## 2022-12-01 LAB — VITAMIN B12: Vitamin B-12: 405 pg/mL (ref 180–914)

## 2022-12-01 MED ORDER — PROCHLORPERAZINE EDISYLATE 10 MG/2ML IJ SOLN
10.0000 mg | Freq: Once | INTRAMUSCULAR | Status: AC
  Administered 2022-12-01: 10 mg via INTRAVENOUS
  Filled 2022-12-01: qty 2

## 2022-12-01 MED ORDER — POLYETHYLENE GLYCOL 3350 17 G PO PACK
17.0000 g | PACK | Freq: Two times a day (BID) | ORAL | Status: DC | PRN
  Administered 2022-12-01 – 2022-12-02 (×2): 17 g via ORAL
  Filled 2022-12-01 (×2): qty 1

## 2022-12-01 MED ORDER — SENNOSIDES-DOCUSATE SODIUM 8.6-50 MG PO TABS
2.0000 | ORAL_TABLET | Freq: Two times a day (BID) | ORAL | Status: DC | PRN
  Administered 2022-12-01 – 2022-12-02 (×2): 2 via ORAL
  Filled 2022-12-01 (×2): qty 2

## 2022-12-01 MED ORDER — KETOROLAC TROMETHAMINE 30 MG/ML IJ SOLN
15.0000 mg | Freq: Three times a day (TID) | INTRAMUSCULAR | Status: DC | PRN
  Administered 2022-12-01 – 2022-12-02 (×3): 15 mg via INTRAVENOUS
  Filled 2022-12-01 (×3): qty 1

## 2022-12-01 NOTE — Progress Notes (Signed)
Paged on call Neuro NP and also Dr Alanda Slim to notify of seizure.  Dr Alanda Slim at side.  Fiance at her side and informed MD of what happened during event.  Now watching TV and following commands.

## 2022-12-01 NOTE — Progress Notes (Addendum)
PROGRESS NOTE  Sara [name] UJW:[id number] DOB: [birth year]   PCP: Teresita Madura, MD  Patient is from: Home.  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  CT head and MRI brain without acute finding.  CT abdomen and pelvis without acute finding.  She was admitted and started on IV fluids and antiemetics.  She was also started on IV Keppra and Vimpat for seizure.  GI symptoms improved.  However, she continued to have daily breakthrough seizure, and transferred to [facility] for LTM EEG.   Patient had another seizure-like activity the night of 4/15 about 10 PM but concomitant EEG before, during and after the event did not show any seizure activity.   Subjective: Seen and examined earlier this morning.  Had seizure episode about 10 PM last night that was aborted with IV Ativan.  She had another seizure-like activity this morning about 9:50 AM that has resolved without medications.  She is currently undergoing LTM EEG.  Complaining of severe headache after seizure-like activity.  She does not look postictal during my exam.  Complained of soreness in her right cheek after seizure-like activity.  Sounded angry when I told her that there is no acute injury.   Addendum Notified by patient's RN about another seizure-like activity.  When I went to evaluate patient, patient was sleeping soundly but wakes up easily.  Not postictal.  She complains of dizziness and some headache.  With significant other out of the room, patient reports feeling distressed about finances and her health situation.  Denies physical or emotional abuse. She states her  boyfriend takes good care of her.   Objective: Vitals:   11/30/22 2220 12/01/22 0406 12/01/22 0500 12/01/22 0805  BP: (!) 138/92 98/75  (!) 148/81  Pulse: (!) 108 95  (!) 108  Resp: Temp: 99.1 F (37.3 C) 97.8 F (36.6 C)  97.9 F (36.6 C)  TempSrc: Oral Oral  Oral  SpO2: 99% 98%  100%  Weight:   91.3 kg   Height:        Examination:  GENERAL: No apparent distress.  Nontoxic. HEENT: MMM.  No evidence of tongue bite.  Blind from left eye. NECK: Supple.  No apparent JVD.  RESP:  No IWOB.  Fair aeration bilaterally. CVS:  RRR. Heart sounds normal.  ABD/GI/GU: BS+. Abd soft, NTND.  MSK/EXT:  Moves extremities.  Subtle left-sided weakness SKIN: no apparent skin lesion or wound NEURO: Awake, alert and oriented appropriately.  Subtle left-sided weakness.  Left paresthesia. PSYCH: Calm. Normal affect.   Procedures:  None  Microbiology summarized: None  Assessment and plan: Principal Problem:   Seizure disorder Active Problems:   Ehlers-Danlos syndrome type III   Asthma, mild intermittent   Chronic pain syndrome   Gastroesophageal reflux disease   Type 1 diabetes mellitus with hyperglycemia  Breakthrough seizure?  CT head and MRI brain without acute finding. Reportedly had daily seizure-like activity while at [facility] and transferred here for LTM EEG.  She had another seizure-like activity the night of 4/15 but no seizure activity noted on LTM EEG.  Reportedly had another 2 seizure-like activities this morning but was not postictal.stressors  about her finance and health.  -Neurology on board -On Keppra, Vimpat and Topamax. -Follow-up further neurorecommendation   Nausea/vomiting/abdominal pain: Seems to have resolved.  CT abdomen and pelvis without acute finding. -Antiemetics as needed   Acute on chronic headache: History of migraine.  Rebound headache?  CT head and MRI brain without acute finding. -Tylenol as needed -Added Toradol for refractory  pain -Continue Topamax and gabapentin -Neurology on board.  History of CVA with left hemiparesis and paresthesia: CT head and right brain without acute finding. -Continue home meds.  Uncontrolled DM-1 with hyperglycemia and retinopathy: A1c 7.7% on 4/12.  On injectable insulin and Ozempic at home.  She states she is not on insulin pump. Recent Labs  Lab 11/30/22 0739 11/30/22 1151 11/30/22 1657 11/30/22 2116 12/01/22 0622  GLUCAP 226* 220* 147* 189* 164*  -Continue current insulin regimen  Essential hypertension: BP slightly elevated. -Continue amlodipine and losartan. -Continue holding HCTZ.  Ehlers-Danlos disease -Noted.   Hyponatremia: Resolved.  Likely from HCTZ.  Asthma: Stable. -Continue albuterol PRN  Financial stressors -Consulted TOC  GERD -Protonix   Blurry vision in right eye.  She is legally blind in left eye.  MRI brain negative.  Blurry vision likely from uncontrolled diabetes.  She is followed by ophthalmology.  Treated with laser.   Morbid obesity Body mass index is 36.81 kg/m.          DVT prophylaxis:  heparin injection 5,000 Units Start: 11/27/22 0600  Code Status: Full code Family Communication: Updated patient's significant other at bedside. Level of care: Progressive Status is: Inpatient Remains inpatient appropriate because: Breakthrough seizure?, headache   Final disposition: TBD Consultants:  Neurology  55 minutes with more than 50% spent in reviewing records, counseling patient/family and coordinating care.   Sch Meds:  Scheduled Meds:  amLODipine  10 mg Oral Daily   aspirin  81 mg Oral Daily   atorvastatin  80 mg Oral Daily   gabapentin  900 mg Oral Q8H   heparin  5,000 Units Subcutaneous Q8H   insulin aspart  0-15 Units Subcutaneous TID WC   insulin glargine-yfgn  15 Units Subcutaneous QHS   losartan  100 mg Oral Daily   pantoprazole (PROTONIX) IV  40 mg Intravenous Daily   sucralfate  1 g Oral BID   topiramate   100 mg Oral TID   Continuous Infusions:  lacosamide (VIMPAT) IV 200 mg (12/01/22 1001)   levETIRAcetam Stopped (12/01/22 0801)   PRN Meds:.acetaminophen **OR** acetaminophen, albuterol, ketorolac, LORazepam, methocarbamol, metoCLOPramide (REGLAN) injection, Muscle Rub, mouth rinse, prochlorperazine  Antimicrobials: Anti-infectives (From admission, onward)    None        I have personally reviewed the following labs and images: CBC: Recent Labs  Lab 11/25/22 2140 11/27/22 0009 11/27/22 0745 11/28/22 0507 12/01/22 0851  WBC 10.1 9.1 7.3 6.6 7.9  NEUTROABS 8.3* 6.8  --   --   --   HGB 12.5 11.5* 11.1* 10.1* 11.6*  HCT 41.0 38.1 36.0 33.1* 37.3  MCV 79.8* 80.0 79.8* 80.7 78.2*  PLT 270 258 201 202 219   BMP &GFR Recent Labs  Lab 11/25/22 2140 11/27/22 0009 11/27/22 0745 12/01/22 0851  NA 134* 134* 133* 136  K 3.6 3.8 3.6 3.7  CL 97* 98 101 108  CO2 24 23 23  20*  GLUCOSE 288* 249* 213* 173*  BUN 16 16 12 15   CREATININE 1.00 0.77 0.77 0.97  CALCIUM 9.1 8.6* 8.1* 9.1  MG  --   --  2.2  2.2  PHOS  --   --  2.9 3.6   Estimated Creatinine Clearance: 76.2 mL/min (by C-G formula based on SCr of 0.97 mg/dL). Liver & Pancreas: Recent Labs  Lab 11/25/22 2140 11/27/22 0009 11/27/22 0745 12/01/22 0851  AST 35  ALT 39  ALKPHOS 90 91 79 80  BILITOT 0.9 0.7 0.6 0.4  PROT 8.4* 8.0 7.1 7.5  ALBUMIN 4.1 3.9 3.5 3.4*   Recent Labs  Lab 11/25/22 2140 11/27/22 0009  LIPASE 67* 54*   No results for input(s): "AMMONIA" in the last 168 hours. Diabetic: No results for input(s): "HGBA1C" in the last 72 hours. Recent Labs  Lab 11/30/22 0739 11/30/22 1151 11/30/22 1657 11/30/22 2116 12/01/22 0622  GLUCAP 226* 220* 147* 189* 164*   Cardiac Enzymes: Recent Labs  Lab 11/27/22 0009 12/01/22 0851  CKTOTAL 214 65   No results for input(s): "PROBNP" in the last 8760 hours. Coagulation Profile: No results for input(s): "INR", "PROTIME" in the last  168 hours. Thyroid Function Tests: No results for input(s): "TSH", "T4TOTAL", "FREET4", "T3FREE", "THYROIDAB" in the last 72 hours. Lipid Profile: No results for input(s): "CHOL", "HDL", "LDLCALC", "TRIG", "CHOLHDL", "LDLDIRECT" in the last 72 hours. Anemia Panel: Recent Labs    12/01/22 0851  VITAMINB12 405  FOLATE 14.6  FERRITIN 161  TIBC 353  IRON 128  RETICCTPCT 1.7   Urine analysis:    Component Value Date/Time   COLORURINE YELLOW 11/26/2022 0300   APPEARANCEUR CLEAR 11/26/2022 0300   LABSPEC <=1.005 11/26/2022 0300   PHURINE 6.0 11/26/2022 0300   GLUCOSEU 150 (A) 11/26/2022 0300   HGBUR SMALL (A) 11/26/2022 0300   BILIRUBINUR NEGATIVE 11/26/2022 0300   KETONESUR 20 (A) 11/26/2022 0300   PROTEINUR >=300 (A) 11/26/2022 0300   NITRITE NEGATIVE 11/26/2022 0300   LEUKOCYTESUR NEGATIVE 11/26/2022 0300   Sepsis Labs: Invalid input(s): "PROCALCITONIN", "LACTICIDVEN"  Microbiology: No results found for this or any previous visit (from the past 240 hour(s)).  Radiology Studies: Overnight EEG with video  Result Date: 12/01/2022 Charlsie Quest, MD     12/01/2022  9:26 AM Patient Name: Sara [name] MRN: [id number] Epilepsy Attending: Charlsie Quest Referring Physician/Provider: Caryl Pina, MD Duration: 11/30/2022 1936 to 12/01/2022 0900 Patient history: 47 y.o. female with a history of  DM2, Ehlers-Danlos, hypertension, migraine, stroke, seizure disorder. Patient presented secondary to nausea/vomiting in addition to seizure episode. EEG to evaluate for seizure. Level of alertness: Awake, asleep AEDs during EEG study: LEV, LCM, TPM, GBP Technical aspects: This EEG study was done with scalp electrodes positioned according to the 10-20 International system of electrode placement. Electrical activity was reviewed with band pass filter of 1-70Hz , sensitivity of 7 uV/mm, display speed of 30mm/sec with a  notched filter applied as appropriate. EEG data were recorded  continuously and digitally stored.  Video monitoring was available and reviewed as appropriate. Description: The posterior dominant rhythm consists of 8-9 Hz activity of moderate voltage (25-35 uV) seen predominantly in posterior head regions, symmetric and reactive to eye opening and eye closing. Sleep was characterized by vertex waves, sleep spindles (12 to 14 Hz), maximal frontocentral region. Patient was noted to have an episode on 11/30/2022 at 2204. Patient was sitting in bed, had head tremor followed by rhythmic whole body twitching, eyes closed. Concomitant EEG before, during and after the event did not show any EEG changes suggest seizure. Event ended at 2209. Hyperventilation and photic stimulation were not performed.   IMPRESSION: This  study is within normal limits. No seizures or epileptiform discharges were seen throughout the recording. On 11/30/2022 at 2204, patient was noted to have an episode as described above without  concomitant EEG change. This was a NON-EPILEPTIC event. A normal interictal EEG does not exclude nor support the diagnosis of epilepsy. Priyanka Annabelle Harman      Breven Guidroz T. Donneisha Beane [facility]  If 7PM-7AM, please contact night-coverage [email or web address] 12/01/2022, 11:47 AM

## 2022-12-01 NOTE — Plan of Care (Signed)
  Problem: Health Behavior/Discharge Planning: Goal: Ability to manage health-related needs will improve Outcome: Progressing   Problem: Clinical Measurements: Goal: Ability to maintain clinical measurements within normal limits will improve Outcome: Progressing Goal: Diagnostic test results will improve Outcome: Progressing Goal: Respiratory complications will improve Outcome: Progressing Goal: Cardiovascular complication will be avoided Outcome: Progressing   Problem: Activity: Goal: Risk for activity intolerance will decrease Outcome: Progressing   Problem: Elimination: Goal: Will not experience complications related to bowel motility Outcome: Progressing Goal: Will not experience complications related to urinary retention Outcome: Progressing   Problem: Pain Managment: Goal: General experience of comfort will improve Outcome: Progressing   Problem: Education: Goal: Expressions of having a comfortable level of knowledge regarding the disease process will increase Outcome: Progressing   Problem: Coping: Goal: Ability to adjust to condition or change in health will improve Outcome: Progressing Goal: Ability to identify appropriate support needs will improve Outcome: Progressing   Problem: Health Behavior/Discharge Planning: Goal: Compliance with prescribed medication regimen will improve Outcome: Progressing   Problem: Medication: Goal: Risk for medication side effects will decrease Outcome: Progressing   Problem: Clinical Measurements: Goal: Complications related to the disease process, condition or treatment will be avoided or minimized Outcome: Progressing Goal: Diagnostic test results will improve Outcome: Progressing   Problem: Safety: Goal: Verbalization of understanding the information provided will improve Outcome: Progressing   Problem: Self-Concept: Goal: Level of anxiety will decrease Outcome: Progressing Goal: Ability to verbalize feelings about  condition will improve Outcome: Progressing

## 2022-12-01 NOTE — TOC Initial Note (Signed)
Transition of Care [facility]) - Initial/Assessment Note    Patient Details  Name: Sara [name] MRN: [id number] Date of Birth: Feb 28, 1976  [facility] CM/SW Contact:    Kermit Balo, RN Phone Number: 12/01/2022, 11:29 AM  Clinical Narrative:                 Pt is from home with her fiance and 2 adult daughters.  She doesn't drive but her fiance and one daughter provide her with needed transportation. Her fiance assists with her medications but she sometimes takes the wrong medication on her own. CM discussed bubble pack medications and labeling the tops of her PRN medication bottle with her and fiance.  [facility] [address] [facility] Pt has family to transport her home when medically ready.  TOC following.  Expected Discharge Plan: Home/Self Care Barriers to Discharge: Continued Medical Work up   Patient Goals and CMS Choice            Expected Discharge Plan and Services   Discharge Planning Services: CM Consult   Living arrangements for the past 2 months: Mobile Home                                      Prior Living Arrangements/Services Living arrangements for the past 2 months: Mobile Home Lives with:: Significant Other Patient language and need for interpreter reviewed:: Yes Do you feel safe going back to the place where you live?: Yes        Care giver support system in place?: Yes (comment)   Criminal Activity/Legal Involvement Pertinent to Current Situation/Hospitalization: No - Comment as needed  Activities of Daily Living Home Assistive Devices/Equipment: Eyeglasses ADL Screening (condition at time of admission) Patient's cognitive ability adequate to safely complete daily activities?: Yes Is the patient deaf or have difficulty hearing?: No Does the patient have difficulty seeing, even when wearing glasses/contacts?: Yes (blind Left eye) Does the patient have difficulty concentrating, remembering, or making  decisions?: No Patient able to express need for assistance with ADLs?: Yes Does the patient have difficulty dressing or bathing?: No Independently performs ADLs?: Yes (appropriate for developmental age) Does the patient have difficulty walking or climbing stairs?: No Weakness of Legs: None Weakness of Arms/Hands: None  Permission Sought/Granted                  Emotional Assessment Appearance:: Appears stated age Attitude/Demeanor/Rapport: Engaged Affect (typically observed): Accepting Orientation: : Oriented to Self, Oriented to Place, Oriented to  Time, Oriented to Situation   Psych Involvement: No (comment)  Admission diagnosis:  Seizures [R56.9] Seizure disorder [G40.909] Vomiting, unspecified vomiting type, unspecified whether nausea present [R11.10] Patient Active Problem List   Diagnosis Date Noted   Seizure disorder 11/27/2022   Obesity 04/01/2022   Asthma 04/01/2022   Asthma, mild intermittent 04/01/2022   Vitamin B 12 deficiency 04/01/2022   Vitamin D deficiency 04/01/2022   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 without macular edema, with long-term current use of insulin 04/01/2022   Gastroesophageal reflux disease 03/17/2022   Foot cramps 02/10/2022   Obesity with body mass index of 30.0-39.9 01/06/2022   Leg swelling 10/31/2021   Type 1 diabetes mellitus with hyperglycemia 03/03/2021   Osteoarthritis of both hips 02/14/2021   Retinopathy 02/14/2021   Nicotine dependence 11/05/2020   Scalp abscess 05/28/2020  Seizure 03/12/2018   Hyperlipidemia 09/20/2017   Hypertension 09/20/2017   Influenza A 09/20/2017   Status asthmaticus 09/20/2017   Tobacco abuse 09/20/2017   Subclinical hyperthyroidism 02/27/2017   Abscess 05/27/2016   Chondromalacia of right patella 04/29/2016   Primary osteoarthritis of right knee 04/29/2016   Bilateral hand pain 01/28/2016   Primary open angle glaucoma of both  eyes, mild stage 01/09/2016   Secondary cataract of both eyes 01/09/2016   Chondromalacia, right knee 11/09/2015   H. pylori infection 06/23/2015   Pruritic rash 06/20/2015   Low back pain 05/22/2015   Type 1 diabetes mellitus 04/04/2015   Epilepsy 04/04/2015   Muscle spasm of calf 04/04/2015   Ehlers-Danlos syndrome type III 04/04/2015   Lower leg pain 04/04/2015   Migraine 04/04/2015   Muscle cramps 12/12/2014   Major depressive disorder, recurrent episode, moderate with anxious distress 08/27/2014   Bilateral knee pain 08/24/2014   Carpal tunnel syndrome of right wrist 07/22/2014   Insulin long-term use 07/11/2014   Right hand pain 06/29/2014   Neck pain 02/10/2014   Intractable chronic migraine without aura 10/24/2013   Cervical herniated disc 10/20/2013   Folliculitis 09/21/2013   Ataxia 07/31/2013   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 [place] [address] Phone: [phone] Fax: [phone]  [facility] [address] [place] - [address] [place] [address] Phone: [phone] Fax: [phone]  [facility] - [place], [place] - [address] [place] [address] Phone: [phone] Fax: [phone]  [id number] [place], [place] - [address] [place] [address] Phone: [phone] Fax: [phone]  [facility] - [place], [place] - [address] [place] [address] Phone: [phone] Fax: [phone]     Social  Determinants of Health (SDOH) Social History: SDOH Screenings   Food Insecurity: No Food Insecurity (11/27/2022)  Housing: Low Risk  (11/27/2022)  Transportation Needs: No Transportation Needs (11/27/2022)  Utilities: Not At Risk (11/27/2022)  Financial Resource Strain: Low Risk  (03/12/2018)  Physical Activity: Inactive (03/12/2018)  Social Connections: Socially Isolated (03/12/2018)  Stress: No Stress Concern Present (03/12/2018)  Tobacco Use: High Risk (11/27/2022)   SDOH Interventions:     Readmission Risk Interventions     No data to display

## 2022-12-01 NOTE — Plan of Care (Signed)
Notified of an episode of thrashing around in bed at 2105. Preliminary EEG review with excessive muscle artifact. Will await formal reading Continue current course   -- Milon Dikes, MD Neurologist [facility] Pager: [phone]

## 2022-12-01 NOTE — Procedures (Signed)
Patient Name: Sara [name]  MRN: [id number]  Epilepsy Attending: Charlsie Quest  Referring Physician/Provider: Caryl Pina, MD  Duration: 11/30/2022 1936 to 12/01/2022 1936  Patient history: 47 y.o. female with a history of  DM2, Ehlers-Danlos, hypertension, migraine, stroke, seizure disorder. Patient presented secondary to nausea/vomiting in addition to seizure episode. EEG to evaluate for seizure.  Level of alertness: Awake, asleep  AEDs during EEG study: LEV, LCM, TPM, GBP  Technical aspects: This EEG study was done with scalp electrodes positioned according to the 10-20 International system of electrode placement. Electrical activity was reviewed with band pass filter of 1-70Hz , sensitivity of 7 uV/mm, display speed of 63mm/sec with a  notched filter applied as appropriate. EEG data were recorded continuously and digitally stored.  Video monitoring was available and reviewed as appropriate.  Description: The posterior dominant rhythm consists of 8-9 Hz activity of moderate voltage (25-35 uV) seen predominantly in posterior head regions, symmetric and reactive to eye opening and eye closing. Sleep was characterized by vertex waves, sleep spindles (12 to 14 Hz), maximal frontocentral region.   Patient was noted to have an episode on 11/30/2022 at 2204. Patient was sitting in bed, had head tremor followed by rhythmic whole body twitching, eyes closed. Concomitant EEG before, during and after the event did not show any EEG changes suggest seizure. Event ended at 2209.  Patient was noted to have an episode on 12/01/2022 at 0950. Patient was sitting in bed, had side to side head tremor followed by rhythmic whole body twitching, eyes closed.  There was significant electrode artifact making it difficult to interpret. After filtering for artifact, concomitant EEG before, during and after the event did not show any EEG changes to suggest seizure.  Patient was noted to have an episode on  12/01/2022 at 1206. Patient was sitting in bed, violently shaking in bed.There was significant electrode artifact making it difficult to interpret. After filtering for artifact, concomitant EEG before, during and after the event did not show any EEG changes to suggest seizure.  Hyperventilation and photic stimulation were not performed.     IMPRESSION: This study is within normal limits. No seizures or epileptiform discharges were seen throughout the recording.  Three events were recorded as described above without  concomitant EEG change. These events were NON-EPILEPTIC.   A normal interictal EEG does not exclude nor support the diagnosis of epilepsy.  [name]

## 2022-12-01 NOTE — Progress Notes (Signed)
LTM maint complete - no skin breakdown under: FZ, A2,GRND. Pt was head wrapped and taped. Rewrapped head and tape to Maint EEG

## 2022-12-01 NOTE — Progress Notes (Addendum)
Neurology Progress Note     Reason for Consult: Possible seizures   HISTORY OF PRESENT ILLNESS   HPI   Sara [name] is a 47 y.o. female with medical history significant of asthma, DMII, Ehlers-Danlos disease, hypertension, migraine,  history of stroke (none seen on MRI but with history of left branch retinal artery occlusion in 2022, presented to [facility] and was admitted there), seizure and/or psychogenic seizures on keppra who presents to ED with complaints of n/v 4-5 days not able to keep med down and most recently increased seizure activity.   Neurology consulted for the seizure activity.  She had 2 witnessed seizures in the emergency department.  Dexcom reading at this time is 299.  Semiology of this event where eye closure, generalized convulsions, immediately stopped seizing and was responsive when her eyes were opened for her by examiner.  Immediately was able to speak with physician at bedside without postictal period, oriented, and with nonfocal neurological examination at the time.  Her seizure regimen is as follows:gabapentin  bid, topiramate  tid, and keppra  bid. She is no longer taking lamictal.   Interim history: -last Night, patient with "typical drum roll" noises that she states are the warnings for seizures. Nursing then noted her to have "tonic clonic seizure"  for which ativan was given.  Long-term EEG monitoring is reassuring, without any epileptiform correlation -Upon chart review, it is not clear to the author if and when the patient was diagnosed with epileptic seizures.  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  Another EEG from 2022 which was also normal.  This was at [facility] -Reviewed admission for left BRAO 10/2020.   History is obtained from:patient and her  partner, at bedside.  PAST MEDICAL HISTORY    Past Medical History:  Past Medical History:  Diagnosis Date   Adopted    Arthritis    hips, lower back   Asthma    Blind left eye    Complication of anesthesia    Was able to feel procedure during neck surgery   Diabetes mellitus without complication    Ehlers-Danlos disease    Epilepsy    Hypertension    Migraine headache    approx 6x yr (now getting injections from neurology)   Seizures    most recent 05/2020   Stroke 10/2020    No family history on file. Family History  Problem Relation Age of Onset   Hypertension Other     Allergies:  Allergies  Allergen Reactions   Other Hives, Itching and Rash    Coban, patient states that skin turn red and has serve itching  Coban, patient states that skin turn red and has serve itching  Coban, patient states that skin turn red and has serve itching  Coban, patient states that skin turn red and has serve itching     Coconut (Cocos Nucifera) Itching and Swelling    Coconut,  Facial swelling   Coconut Fatty Acids Itching   Latex Itching and Rash    (Elastic in underwear)   Tape Rash    Social History:   reports that she has been smoking cigarettes. She has been smoking an average of .33 packs per day. She has never used smokeless tobacco. She reports current alcohol use of about 1.0 standard drink of alcohol  per week. She reports that she does not use drugs.    Medications Medications Prior to Admission  Medication Sig Dispense Refill   acetaminophen (TYLENOL) 500 MG tablet Take 500 mg by mouth every 6 (six) hours as needed for moderate pain.     albuterol (PROVENTIL HFA;VENTOLIN HFA) 108 (90 Base) MCG/ACT inhaler Inhale 2 to 3 puffs into the lungs every 6 hours as needed (respiratory problems)     amLODipine (NORVASC) 10 MG tablet Take 10 mg by mouth daily.     aspirin 81 MG chewable tablet Chew 81 mg by mouth daily.     atorvastatin (LIPITOR) 80 MG tablet Take 80 mg by  mouth daily.     betamethasone dipropionate 0.05 % cream APPLY TOPICALLY TWICE DAILY (Patient taking differently: Apply 1 Application topically 2 (two) times daily.) 45 g 1   bisacodyl (DULCOLAX) 10 MG suppository Place 10 mg rectally daily as needed for moderate constipation.     Calcium Carbonate-Simethicone (MAALOX MAX) 1000-60 MG CHEW Chew 2 tablets by mouth daily as needed (indigestion).     cetirizine (ZYRTEC) 10 MG tablet Take 10 mg by mouth daily.     cyclobenzaprine (FLEXERIL) 10 MG tablet Take 1 tablet (10 mg total) by mouth 3 (three) times daily as needed for muscle spasms. (Patient taking differently: Take 10 mg by mouth at bedtime.) 10 tablet 0   famotidine (PEPCID) 40 MG tablet Take 40 mg by mouth 2 (two) times daily.     furosemide (LASIX) 20 MG tablet Take 60 mg by mouth daily.     gabapentin (NEURONTIN) 300 MG capsule Take 900 mg by mouth 3 (three) times daily.     hydrochlorothiazide (HYDRODIURIL) 50 MG tablet Take 50 mg by mouth daily.     ibuprofen (ADVIL) 800 MG tablet Take 800 mg by mouth every 8 (eight) hours as needed for moderate pain.     insulin aspart (NOVOLOG) 100 UNIT/ML injection For insulin pump - administration average 160u over course of day     insulin glargine (LANTUS) 100 UNIT/ML injection Inject 15 Units into the skin at bedtime.     insulin lispro (HUMALOG) 100 UNIT/ML injection Inject 10 Units into the skin 3 (three) times daily before meals. Sliding scale     levETIRAcetam (KEPPRA) 750 MG tablet Take 1,500 mg by mouth 2 (two) times daily.     loperamide (IMODIUM) 2 MG capsule Take 2 mg by mouth 4 (four) times daily as needed for diarrhea or loose stools.     losartan (COZAAR) 100 MG tablet Take 100 mg by mouth daily.     nicotine (NICOTROL) 10 MG inhaler Inhale 1 continuous puffing into the lungs every 2 (two) hours as needed for smoking cessation.     omeprazole (PRILOSEC) 20 MG capsule Take 20 mg by mouth 2 (two) times daily before a meal.      polyethylene glycol (MIRALAX / GLYCOLAX) 17 g packet Take 17 g by mouth daily.     pregabalin (LYRICA) 100 MG capsule Take 100 mg by mouth 3 (three) times daily.     prochlorperazine (COMPAZINE) 5 MG tablet Take 5 mg by mouth every 6 (six) hours as needed for nausea or vomiting.     Semaglutide, 1 MG/DOSE, (OZEMPIC, 1 MG/DOSE,) 4 MG/3ML SOPN Inject 1 mg into the skin once a week.     SENEXON-S 8.6-50 MG tablet Take 2 tablets by mouth daily.     tacrolimus (PROTOPIC) 0.1 % ointment Apply 1 Application topically  2 (two) times daily.     Thiamine HCl (VITAMIN B-1) 250 MG tablet Take 250 mg by mouth daily.     amLODipine (NORVASC) 10 MG tablet Take 1 tablet by mouth daily.     blood glucose meter kit and supplies by Other route once for 1 dose. Use as instructed     cephALEXin (KEFLEX) 500 MG capsule Take 1 capsule (500 mg total) by mouth 3 (three) times daily. (Patient not taking: Reported on 11/27/2022) 15 capsule 0   hydrochlorothiazide (HYDRODIURIL) 25 MG tablet Take 1 tablet by mouth daily.     lamoTRIgine (LAMICTAL) 100 MG tablet Take 1 tablet (100 mg total) by mouth 2 (two) times daily. (Patient taking differently: Take 250 mg by mouth 2 (two) times daily. ) 60 tablet 0   losartan (COZAAR) 50 MG tablet Take by mouth.     ondansetron (ZOFRAN-ODT) 4 MG disintegrating tablet 4mg  ODT q4 hours prn nausea/vomit (Patient not taking: Reported on 11/27/2022) 10 tablet 0   pregabalin (LYRICA) 100 MG capsule Take by mouth.     topiramate (TOPAMAX) 100 MG tablet Take 1 tablet (100 mg total) by mouth 4 (four) times daily. (Patient taking differently: Take 100 mg by mouth 2 (two) times daily. ) 120 tablet 0    EXAMINATION    Current vital signs:    12/01/2022   12:17 PM 12/01/2022    8:05 AM 12/01/2022    5:00 AM  Vitals with BMI  Weight   201 lbs 4 oz  BMI   36.81  Systolic 140 148   Diastolic 88 81   Pulse 97 108    Physical Exam  Appears well-developed and well-nourished.   Neuro: Mental  Status: Patient is awake, alert, oriented to person, place, month, year, and situation. Patient is able to give a clear and coherent history. No signs of aphasia or neglect Cranial Nerves: II: OU and OD: impaired finger counting right superior temporal field, otherwise intact. OS: no perceived vision, motion, nor color recognition  III,IV, VI: EOM in tact, no ptosis  V: Facial sensation is symmetric to temperature VII: Facial movement is symmetric.  VIII: hearing is intact to voice X: Uvula elevates symmetrically XI: Shoulder shrug is symmetric. XII: tongue is midline without atrophy or fasciculations.  Motor: Tone is normal. Bulk is normal.  Left hemibody weakness 4/5 . Right hemibody 5/5   Sensory: Reduced sensation in right ring and pinky finger and in tips of toes bilaterally previously. Today, left arm less sensate to light touch but leg is intact.   Cerebellar: FNF and HKS are intact bilaterally  LABS   I have reviewed labs in epic and the results pertinent to this consultation are:  No results found for: "[facility]" Lab Results  Component Value Date   ALT 39 12/01/2022   AST 35 12/01/2022   ALKPHOS 80 12/01/2022   BILITOT 0.4 12/01/2022   Lab Results  Component Value Date   HGBA1C 7.7 (H) 11/27/2022   Lab Results  Component Value Date   WBC 7.9 12/01/2022   HGB 11.6 (L) 12/01/2022   HCT 37.3 12/01/2022   MCV 78.2 (L) 12/01/2022   PLT 219 12/01/2022   Lab Results  Component Value Date   VITAMINB12 405 12/01/2022   Lab Results  Component Value Date   FOLATE 14.6 12/01/2022   Lab Results  Component Value Date   NA 136 12/01/2022   K 3.7 12/01/2022   CL 108 12/01/2022   CO2 20 (L)  12/01/2022    DIAGNOSTIC IMAGING/PROCEDURES   I have reviewed the images obtained:  CTH: negative  Most recent MRI brain from 11/29/2022 is unremarkable hide from known white matter disease which is mild   Assessment: Arnitra Sokoloski is a 47 y.o. 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 Patient presented secondary to nausea/vomiting in addition to seizure episode. Patient admitted and started on IV fluids and antiemetics. IV Keppra and Vimpat started. Patient with three seizures during hospitalization, semiology eye closure, diffuse twitching upper and lower extremities bilaterally, cessation of event when examiner forcefully open patient's eyes and without postictal confusion. - Since long-term monitoring EEG has been in place, no electrographic activity to suggest epileptic seizures.  Her event last evening with "drum roll" aura and "tonic-clonic" semiology did not have correlate on EEG - Her left sided examination findings vary per examiner (I note left sided weakness that she states is from " prior stroke" but this was not noted previously and there is no clear reason for this weakness since only stroke she seems to have suffered from was a branch retinal artery occlusion.  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 Caryl Pina

## 2022-12-02 DIAGNOSIS — D649 Anemia, unspecified: Secondary | ICD-10-CM | POA: Insufficient documentation

## 2022-12-02 DIAGNOSIS — I679 Cerebrovascular disease, unspecified: Secondary | ICD-10-CM | POA: Diagnosis not present

## 2022-12-02 DIAGNOSIS — I10 Essential (primary) hypertension: Secondary | ICD-10-CM | POA: Insufficient documentation

## 2022-12-02 DIAGNOSIS — J4520 Mild intermittent asthma, uncomplicated: Secondary | ICD-10-CM | POA: Diagnosis not present

## 2022-12-02 DIAGNOSIS — G43909 Migraine, unspecified, not intractable, without status migrainosus: Secondary | ICD-10-CM

## 2022-12-02 DIAGNOSIS — G40909 Epilepsy, unspecified, not intractable, without status epilepticus: Secondary | ICD-10-CM | POA: Diagnosis not present

## 2022-12-02 DIAGNOSIS — R112 Nausea with vomiting, unspecified: Secondary | ICD-10-CM | POA: Diagnosis not present

## 2022-12-02 DIAGNOSIS — F445 Conversion disorder with seizures or convulsions: Secondary | ICD-10-CM

## 2022-12-02 LAB — COMPREHENSIVE METABOLIC PANEL
ALT: 35 U/L (ref 0–44)
AST: 25 U/L (ref 15–41)
Albumin: 2.9 g/dL — ABNORMAL LOW (ref 3.5–5.0)
Alkaline Phosphatase: 68 U/L (ref 38–126)
Anion gap: 11 (ref 5–15)
BUN: 25 mg/dL — ABNORMAL HIGH (ref 6–20)
CO2: 17 mmol/L — ABNORMAL LOW (ref 22–32)
Calcium: 8.6 mg/dL — ABNORMAL LOW (ref 8.9–10.3)
Chloride: 110 mmol/L (ref 98–111)
Creatinine, Ser: 1.27 mg/dL — ABNORMAL HIGH (ref 0.44–1.00)
GFR, Estimated: 53 mL/min — ABNORMAL LOW (ref >60)
Glucose, Bld: 166 mg/dL — ABNORMAL HIGH (ref 70–99)
Potassium: 4.3 mmol/L (ref 3.5–5.1)
Sodium: 138 mmol/L (ref 135–145)
Total Bilirubin: 0.4 mg/dL (ref 0.3–1.2)
Total Protein: 6 g/dL — ABNORMAL LOW (ref 6.5–8.1)

## 2022-12-02 LAB — FERRITIN: Ferritin: 111 ng/mL (ref 11–307)

## 2022-12-02 LAB — GLUCOSE, CAPILLARY
Glucose-Capillary: 157 mg/dL — ABNORMAL HIGH (ref 70–99)
Glucose-Capillary: 173 mg/dL — ABNORMAL HIGH (ref 70–99)

## 2022-12-02 LAB — RETICULOCYTES
Immature Retic Fract: 6.5 % (ref 2.3–15.9)
RBC.: 4.33 MIL/uL (ref 3.87–5.11)
Retic Count, Absolute: 71 10*3/uL (ref 19.0–186.0)
Retic Ct Pct: 1.6 % (ref 0.4–3.1)

## 2022-12-02 LAB — CBC
HCT: 34.3 % — ABNORMAL LOW (ref 36.0–46.0)
Hemoglobin: 10.4 g/dL — ABNORMAL LOW (ref 12.0–15.0)
MCH: 24.1 pg — ABNORMAL LOW (ref 26.0–34.0)
MCHC: 30.3 g/dL (ref 30.0–36.0)
MCV: 79.4 fL — ABNORMAL LOW (ref 80.0–100.0)
Platelets: 161 10*3/uL (ref 150–400)
RBC: 4.32 MIL/uL (ref 3.87–5.11)
RDW: 13.5 % (ref 11.5–15.5)
WBC: 6.8 10*3/uL (ref 4.0–10.5)
nRBC: 0 % (ref 0.0–0.2)

## 2022-12-02 LAB — FOLATE: Folate: 13.2 ng/mL (ref >5.9)

## 2022-12-02 LAB — IRON AND TIBC
Iron: 101 ug/dL (ref 28–170)
Saturation Ratios: 35 % — ABNORMAL HIGH (ref 10.4–31.8)
TIBC: 290 ug/dL (ref 250–450)
UIBC: 189 ug/dL

## 2022-12-02 LAB — VITAMIN B12: Vitamin B-12: 402 pg/mL (ref 180–914)

## 2022-12-02 LAB — PHOSPHORUS: Phosphorus: 4.3 mg/dL (ref 2.5–4.6)

## 2022-12-02 LAB — MAGNESIUM: Magnesium: 2.1 mg/dL (ref 1.7–2.4)

## 2022-12-02 MED ORDER — LAMOTRIGINE 100 MG PO TABS: 100.0000 mg | ORAL_TABLET | Freq: Two times a day (BID) | ORAL | Status: DC

## 2022-12-02 MED ORDER — PANTOPRAZOLE SODIUM 20 MG PO TBEC: 20.0000 mg | DELAYED_RELEASE_TABLET | Freq: Every day | ORAL | Status: DC

## 2022-12-02 MED ORDER — LEVETIRACETAM 750 MG PO TABS: 1500.0000 mg | ORAL_TABLET | Freq: Two times a day (BID) | ORAL | Status: DC

## 2022-12-02 MED ORDER — TOPIRAMATE 100 MG PO TABS: 100.0000 mg | ORAL_TABLET | Freq: Two times a day (BID) | ORAL | Status: DC

## 2022-12-02 MED ORDER — ONDANSETRON HCL 4 MG PO TABS: 4.0000 mg | ORAL_TABLET | Freq: Every day | ORAL | 1 refills | Status: AC | PRN

## 2022-12-02 MED ORDER — SODIUM CHLORIDE 0.9 % IV BOLUS
1000.0000 mL | Freq: Once | INTRAVENOUS | Status: AC
  Administered 2022-12-02: 1000 mL via INTRAVENOUS

## 2022-12-02 NOTE — Assessment & Plan Note (Signed)
-   Continue gabapentin, Robaxin - Continue Topamax for migraine prevention

## 2022-12-02 NOTE — TOC Transition Note (Signed)
Transition of Care [facility]) - CM/SW Discharge Note   Patient Details  Name: Sara [name] MRN: [id number] Date of Birth: Dec 20, 1975  Transition of Care [facility]) CM/SW Contact:  Kermit Balo, RN Phone Number: 12/02/2022, 3:17 PM   Clinical Narrative:    Recommendations are for CIR but pt doesn't have a medical diagnosis to qualify her for CIR. CM met with the patient and her mother and they have decided on home with home health services. This was arranged with [facility]. Information on the AVS.  Walker and BSC ordered through [employer] and will be delivered to the room. Mother to oversee her medications at home and provide supervision needed.  Pt's mother to provide transport home.    Final next level of care: Home w Home Health Services Barriers to Discharge: No Barriers Identified   Patient Goals and CMS Choice CMS Medicare.gov Compare Post Acute Care list provided to:: Patient Choice offered to / list presented to : Patient, Parent  Discharge Placement                         Discharge Plan and Services Additional resources added to the After Visit Summary for     Discharge Planning Services: CM Consult            DME Arranged: Bedside commode, Walker rolling DME Agency: [employer] Date DME Agency Contacted: 12/02/22   Representative spoke with at DME Agency: Barbara Cower HH Arranged: PT, OT [facility] Agency: [facility] Date [facility] Agency Contacted: 12/02/22   Representative spoke with at [facility] Agency: Tresa Endo  Social Determinants of Health (SDOH) Interventions SDOH Screenings   Food Insecurity: No Food Insecurity (11/27/2022)  Housing: Low Risk  (11/27/2022)  Transportation Needs: No Transportation Needs (11/27/2022)  Utilities: Not At Risk (11/27/2022)  Financial Resource Strain: Low Risk  (03/12/2018)  Physical Activity: Inactive (03/12/2018)  Social Connections: Socially Isolated (03/12/2018)  Stress: No Stress Concern Present (03/12/2018)   Tobacco Use: High Risk (11/27/2022)     Readmission Risk Interventions     No data to display

## 2022-12-02 NOTE — Assessment & Plan Note (Signed)
Asymptomatic. 

## 2022-12-02 NOTE — Assessment & Plan Note (Signed)
This was present on admission.  Did not improve initially with anti-emetics.  Now appears to have resolved.  Tolerated lunch today well.  Electrolytes and renal function normal.

## 2022-12-02 NOTE — Evaluation (Signed)
Physical Therapy Evaluation Patient Details Name: Sara [name] MRN: [id number] DOB: 1975/10/01 Today's Date: 12/02/2022  History of Present Illness  Pt is a 47 y/o female presenting on 4/11 to [facility] with nausea, vomiting, and breakthrough seizure. CT and MRI negative for acute finding. Transferred to [facility] for LTM EEG. EEG WNL. PMH includes: arthritis, blind L eye, DM-1, epilespy, HTN, seizures, CVA with L hemiparesis, ehlers-danlos syndrome.  Clinical Impression  PTA, pt reports returning to independence with mobility/ADL's following prior strokes. Pt presents with cognitive deficits, impaired standing balance, gait abnormalities, decreased coordination, and visual deficit. Pt requiring two person min-mod assist for transfers and ambulating room distances with a RW. Pt becoming emotionally labile at end of session, stating not wanting to rely on family for some assist and does not have 24/7 care. Patient will benefit from continued inpatient follow up therapy, <3 hours/day      Recommendations for follow up therapy are one component of a multi-disciplinary discharge planning process, led by the attending physician.  Recommendations may be updated based on patient status, additional functional criteria and insurance authorization.  Follow Up Recommendations Can patient physically be transported by private vehicle: No     Assistance Recommended at Discharge Frequent or constant Supervision/Assistance  Patient can return home with the following  A lot of help with walking and/or transfers;A lot of help with bathing/dressing/bathroom;Assistance with cooking/housework;Direct supervision/assist for medications management;Direct supervision/assist for financial management;Assist for transportation;Help with stairs or ramp for entrance    Equipment Recommendations Wheelchair (measurements PT);Wheelchair cushion (measurements PT);Rolling walker (2 wheels)  Recommendations for Other Services        Functional Status Assessment Patient has had a recent decline in their functional status and demonstrates the ability to make significant improvements in function in a reasonable and predictable amount of time.     Precautions / Restrictions Precautions Precautions: Fall Precaution Comments: seizure, visual deficits (blind L eye, blurry vision R eye) Restrictions Weight Bearing Restrictions: No      Mobility  Bed Mobility Overal bed mobility: Needs Assistance Bed Mobility: Supine to Sit     Supine to sit: Min assist     General bed mobility comments: Cues to initiate, minA to execute    Transfers Overall transfer level: Needs assistance Equipment used: Rolling walker (2 wheels), None Transfers: Sit to/from Stand Sit to Stand: Mod assist, +2 physical assistance           General transfer comment: Pt unable to stand on initial attempt with bilateral HHA. Transitioned to RW, where she stood with modA + 2, with cues for extending knees and standing upright    Ambulation/Gait Ambulation/Gait assistance: Min assist, +2 physical assistance, +2 safety/equipment Gait Distance (Feet): 15 Feet Assistive device: Rolling walker (2 wheels) Gait Pattern/deviations: Step-through pattern, Step-to pattern, Decreased stride length, Decreased dorsiflexion - right, Decreased dorsiflexion - left Gait velocity: decreased Gait velocity interpretation: <1.31 ft/sec, indicative of household ambulator   General Gait Details: Cues for stepping initiation, assist for walker management and obstacle navigation, minA + 2 for balance and safety. Mildly ataxic BLE's  Stairs            Wheelchair Mobility    Modified Rankin (Stroke Patients Only)       Balance Overall balance assessment: Needs assistance Sitting-balance support: Feet supported Sitting balance-Leahy Scale: Fair     Standing balance support: Bilateral upper extremity supported Standing balance-Leahy Scale: Poor  Pertinent Vitals/Pain Pain Assessment Pain Assessment: Faces Faces Pain Scale: Hurts a little bit Pain Location: R foot (hx of Charcot) Pain Descriptors / Indicators: Discomfort Pain Intervention(s): Monitored during session    Home Living Family/patient expects to be discharged to:: Private residence Living Arrangements: Spouse/significant other;Other (Comment) (2 daughters) Available Help at Discharge: Family;Available PRN/intermittently Type of Home: Mobile home Home Access: Stairs to enter Entrance Stairs-Rails: Left (front door; R railing on back steps) Entrance Stairs-Number of Steps: 5   Home Layout: One level Home Equipment: Crutches;Other (comment);Tub bench (suction cup grab bars) Additional Comments: Dog Jasper    Prior Function Prior Level of Function : Independent/Modified Independent             Mobility Comments: Returned to independence after prior stroke ADLs Comments: independent ADLs, IADLs     Hand Dominance   Dominant Hand: Right    Extremity/Trunk Assessment   Upper Extremity Assessment Upper Extremity Assessment: Defer to OT evaluation    Lower Extremity Assessment Lower Extremity Assessment: RLE deficits/detail;LLE deficits/detail RLE Deficits / Details: Hx Charcot foot per patient. Grossly 4/5 strength RLE Sensation: WNL LLE Deficits / Details: Grossly 4/5 LLE Sensation: WNL    Cervical / Trunk Assessment Cervical / Trunk Assessment: Normal  Communication   Communication: No difficulties  Cognition Arousal/Alertness: Awake/alert Behavior During Therapy: Anxious (emotionally labile towards end of session) Overall Cognitive Status: Impaired/Different from baseline Area of Impairment: Attention, Memory, Following commands, Safety/judgement, Awareness, Problem solving                   Current Attention Level: Sustained (needs cues to sustain) Memory: Decreased short-term  memory Following Commands: Follows one step commands with increased time Safety/Judgement: Decreased awareness of safety, Decreased awareness of deficits Awareness: Intellectual Problem Solving: Difficulty sequencing, Requires verbal cues, Decreased initiation General Comments: Pt tangential and needs cues to redirect attention and for problem solving tasks.        General Comments General comments (skin integrity, edema, etc.): VSS on RA    Exercises     Assessment/Plan    PT Assessment Patient needs continued PT services  PT Problem List Decreased strength;Decreased activity tolerance;Decreased balance;Decreased mobility;Decreased coordination;Decreased cognition;Decreased safety awareness       PT Treatment Interventions DME instruction;Gait training;Functional mobility training;Therapeutic activities;Therapeutic exercise;Balance training;Neuromuscular re-education;Cognitive remediation;Patient/family education    PT Goals (Current goals can be found in the Care Plan section)  Acute Rehab PT Goals Patient Stated Goal: agreeable to rehab if needed PT Goal Formulation: With patient Time For Goal Achievement: 12/16/22 Potential to Achieve Goals: Good    Frequency Min 3X/week     Co-evaluation PT/OT/SLP Co-Evaluation/Treatment: Yes Reason for Co-Treatment: Complexity of the patient's impairments (multi-system involvement);For patient/therapist safety;Necessary to address cognition/behavior during functional activity;To address functional/ADL transfers PT goals addressed during session: Mobility/safety with mobility OT goals addressed during session: ADL's and self-care       AM-PAC PT "6 Clicks" Mobility  Outcome Measure Help needed turning from your back to your side while in a flat bed without using bedrails?: A Little Help needed moving from lying on your back to sitting on the side of a flat bed without using bedrails?: A Little Help needed moving to and from a bed  to a chair (including a wheelchair)?: A Little Help needed standing up from a chair using your arms (e.g., wheelchair or bedside chair)?: A Lot Help needed to walk in hospital room?: A Lot Help needed climbing 3-5 steps with a railing? : Total  6 Click Score: 14    End of Session Equipment Utilized During Treatment: Gait belt Activity Tolerance: Patient tolerated treatment well Patient left: in chair;with call bell/phone within reach;with chair alarm set Nurse Communication: Mobility status PT Visit Diagnosis: Unsteadiness on feet (R26.81);Other abnormalities of gait and mobility (R26.89);Difficulty in walking, not elsewhere classified (R26.2)    Time: 9147-8295 PT Time Calculation (min) (ACUTE ONLY): 32 min   Charges:   PT Evaluation $PT Eval Moderate Complexity: 1 Mod          Lillia Pauls, PT, DPT Acute Rehabilitation Services Office [phone]   Norval Morton 12/02/2022, 1:43 PM

## 2022-12-02 NOTE — Assessment & Plan Note (Addendum)
History stroke and residual hemiparesis and paresthesia.  Here, reports she is weaker than baseline.  Neuraxial imaging normal.  No evidence of infection or electrolyte abnormality. - Continue aspirin - Resume atorvastatin

## 2022-12-02 NOTE — Assessment & Plan Note (Addendum)
Patient admitted with recurrent observed seizure-like episodes, continuing daily in the hospital.  Neuraxial imaging unremarkable.    cEEG obtained, showed no epileptiform correlate with seizure-like episodes. - Continue home AEDs, Keppra and Lamictal

## 2022-12-02 NOTE — Assessment & Plan Note (Signed)
Follow up with PCP

## 2022-12-02 NOTE — Care Management Important Message (Signed)
Important Message  Patient Details  Name: Sara [name] MRN: [id number] Date of Birth: 1976-02-06   Medicare Important Message Given:  Yes     [name] [name] 12/02/2022, 3:32 PM

## 2022-12-02 NOTE — Assessment & Plan Note (Signed)
-   Continue PPI, change to oral

## 2022-12-02 NOTE — Progress Notes (Addendum)
Subjective: Hide multiple events overnight.  Per mother, he had difficult events.  ROS: negative except above  Examination  Vital signs in last 24 hours: Temp:  [97.6 F (36.4 C)-98.5 F (36.9 C)] 98.5 F (36.9 C) (04/17 1100) Pulse Rate:  [98-106] 104 (04/17 1100) Resp:  [12-20] 19 (04/17 1100) BP: (77-146)/(54-95) 127/87 (04/17 1100) SpO2:  [95 %-100 %] 100 % (04/17 1100)  General: Sitting in recliner, not in apparent distress Neuro: MS: Alert, oriented, follows commands CN: pupils equal and reactive,  EOMI, face symmetric, tongue midline, normal sensation over face, could not count fingers.  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1 7.3 6.6 7.9 6.8  NEUTROABS 8.3* 6.8  --   --   --   --   HGB 12.5 11.5* 11.1* 10.1* 11.6* 10.4*  HCT 41.0 38.1 36.0 33.1* 37.3 34.3*  MCV 79.8* 80.0 79.8* 80.7 78.2* 79.4*  PLT 270 258 201 202 219 161     Coagulation Studies: No results for input(s): "LABPROT", "INR" in the last 72 hours.  Imaging MRI brain without contrast 4/40/2024: Unremarkable brain MRI for patient age.  No acute abnormality.    ASSESSMENT AND PLAN: 47 year old female with history of left branch retinal artery occlusion, seizures who presented with increased frequency of seizure-like episodes.  Psychogenic nonepileptic events  (pseudoseizures) -Reported multiple events consistent with nonepileptic spells.  Recommendations -Can continue home antiseizure medications for now.  Patient reportedly was not taking lamotrigine.  Therefore not resuming currently.  Also discussed that patient can be potentially weaned off of antiseizure medications in future -Discussed with family the diagnosis of nonepileptic events.  Patient states she already has a therapist and will continue seeing her for cognitive eval therapy -[facility] LTM EEG -Continue seizure precautions -Follow-up with psychiatry/therapist.  Follow-up with Dr. Sherryll Burger at [facility] in 4 to 6 weeks -Discussed plan with Dr. Maryfrances Bunnell via secure chat  Seizure precautions: Per [facility] statutes, patients with seizures are not allowed to drive until they have been seizure-free for six months and cleared by a physician    Use caution when using heavy equipment or power tools. Avoid working on ladders or at heights. Take showers instead of baths. Ensure the water temperature is not too high on the home water heater. Do not go swimming alone. Do not lock yourself in a room alone (i.e. bathroom). When caring for infants or small children, sit down when holding, feeding, or changing them to minimize risk of injury to the child in the event you have a seizure. Maintain good sleep hygiene. Avoid alcohol.    If patient has another seizure, call 911 and bring them back to the ED if:  A.  The seizure lasts longer than 5 minutes.      B.  The patient doesn't wake shortly after the seizure or has new problems such as difficulty seeing, speaking or moving following the seizure C.  The patient was injured during the seizure D.  The patient has a temperature over 102 F (39C) E.  The patient vomited during the seizure and now is having trouble breathing    During the Seizure   - First, ensure adequate ventilation and place patients on the floor on their left side  Loosen clothing  around the neck and ensure the airway is patent. If the patient is clenching the teeth, do not force the mouth open with any object as this can cause severe damage - Remove all items from the surrounding that can be hazardous. The patient may be oblivious to what's happening and may not even know what he or she is doing. If the patient is confused and wandering, either gently guide him/her away and block access to outside areas - Reassure the individual and be comforting - Call 911. In most cases, the seizure ends before EMS arrives. However, there are cases when seizures may last over 3 to 5 minutes. Or the individual may have developed breathing difficulties or severe injuries. If a pregnant patient or a person with diabetes develops a seizure, it is prudent to call an ambulance. - Finally, if the patient does not regain full consciousness, then call EMS. Most patients will remain confused for about 45 to 90 minutes after a seizure, so you must use judgment in calling for help.    After the Seizure (Postictal Stage)   After a seizure, most patients experience confusion, fatigue, muscle pain and/or a headache. Thus, one should permit the individual to sleep. For the next few days, reassurance is essential. Being calm and helping reorient the person is also of importance.   Most seizures are painless and end spontaneously. Seizures are not harmful to others but can lead to complications such as stress on the lungs, brain and the heart. Individuals with prior lung problems may develop labored breathing and respiratory distress.    I have spent a total of 45   minutes with the patient reviewing hospital notes,  test results, labs and examining the patient as well as establishing an assessment and plan that was discussed personally with the patient.  > 50% of time was spent in direct patient care.     Lindie Spruce Epilepsy [facility] For questions after 5pm please refer to AMION to  reach the Neurologist on call

## 2022-12-02 NOTE — Progress Notes (Signed)
vLTM discontinued  No skin breakdown noted at all skin sites.  [facility] notiified 

## 2022-12-02 NOTE — Discharge Summary (Signed)
Physician Discharge Summary   Sara: Sara [name] MRN: [id number] DOB: 11-02-1975  Admit date:     11/26/2022  Discharge date: 12/02/22  Discharge Physician: Alberteen Sam   PCP: Teresita Madura, MD     Recommendations at discharge:  Follow up with PCP Dr. Alvino Blood in 1 week for nausea/vomiting and pseudoseizures Dr. Alvino Blood:  Please check BP and BMP and restart amlodipine, furosemide, and losartan as needed Please check CBC in 1 month  Follow up with Neurology Dr. Sherryll Burger in 4-6 weeks for pseudoseizures     Discharge Diagnoses: Principal Problem:   Conversion disorder presenting as seizure-like episode Active Problems:   Intractable nausea and vomiting   Cerebrovascular disease with chronic left hemiparesis   Ehlers-Danlos syndrome type III   Morbid obesity   Asthma, mild intermittent   Chronic pain syndrome   Gastroesophageal reflux disease   Type 1 diabetes mellitus with hyperglycemia   Nonintractable chronic migraine   Normocytic anemia   Essential hypertension      Hospital Course: Sara [name] is a 47 y.o. F with HTN, migraines, hx CVA, obesity, who presented with nausea/vomiting and a seizure episode.        * Conversion disorder presenting as seizure-like episode Sara admitted with recurrent observed seizure-like episodes.  Neuraxial imaging unremarkable.    Neurology consulted, cEEG obtained, showed no epileptiform correlate with seizure-like episodes.  Neurology recommended continuing her home AEDs and follow up with her primary Neurologist to discuss tapering off AEDs as appropriate.   (There was some discrepancy between what Sara told RPh tech on admission, that she was taking Keppra and Lamictal at home, and what she subsequently told us, that she was only taking Keppra)  Sara informed Sara to drive for 6 months.    Intractable nausea and vomiting This was present on admission.  Cross-sectional abdomen imaging  unremarkable.  Electrolytes and renal function normal.  Did Sara improve initially with anti-emetics.  Subsequently resolved and on the day of discharge, able to tolerate PO without difficulty.  Discharged with PRN ondansetron and PCP follow up.         Essential hypertension BP soft in the hospital. HCTZ, amlodipine and losartan held at discharge with close PCP follow up to resume.   Normocytic anemia No clinical blood loss noted. - Follow up with PCP for follow up               The [facility] Controlled Substances Registry was reviewed for this Sara prior to discharge.   Consultants: Neurology  Procedures performed:  Continuous EEG MRI brain CT head CT abdomen and pelvis   Disposition: Home health Diet recommendation:  Discharge Diet Orders (From admission, onward)     Start     Ordered   12/02/22 0000  Diet - low sodium heart healthy        12/02/22 1715             DISCHARGE MEDICATION: Allergies as of 12/02/2022       Reactions   Other Hives, Itching, Rash   Coban, Sara states that skin turn red and has serve itching  Coban, Sara states that skin turn red and has serve itching  Coban, Sara states that skin turn red and has serve itching  Coban, Sara states that skin turn red and has serve itching    Coconut (cocos Nucifera) Itching, Swelling   Coconut,  Facial swelling   Coconut Fatty Acids Itching   Latex Itching, Rash   (Elastic  in underwear)   Tape Rash        Medication List     STOP taking these medications    amLODipine 10 MG tablet Commonly known as: NORVASC   furosemide 20 MG tablet Commonly known as: LASIX   hydrochlorothiazide 50 MG tablet Commonly known as: HYDRODIURIL   ibuprofen 800 MG tablet Commonly known as: ADVIL   losartan 50 MG tablet Commonly known as: COZAAR       TAKE these medications    acetaminophen 500 MG tablet Commonly known as: TYLENOL Take 500 mg by mouth every 6 (six)  hours as needed for moderate pain.   albuterol 108 (90 Base) MCG/ACT inhaler Commonly known as: VENTOLIN HFA Inhale 2 to 3 puffs into the lungs every 6 hours as needed (respiratory problems)   aspirin 81 MG chewable tablet Chew 81 mg by mouth daily.   atorvastatin 80 MG tablet Commonly known as: LIPITOR Take 80 mg by mouth daily.   betamethasone dipropionate 0.05 % cream APPLY TOPICALLY TWICE DAILY What changed: how much to take   bisacodyl 10 MG suppository Commonly known as: DULCOLAX Place 10 mg rectally daily as needed for moderate constipation.   blood glucose meter kit and supplies by Other route once for 1 dose. Use as instructed   cetirizine 10 MG tablet Commonly known as: ZYRTEC Take 10 mg by mouth daily.   cyclobenzaprine 10 MG tablet Commonly known as: FLEXERIL Take 1 tablet (10 mg total) by mouth 3 (three) times daily as needed for muscle spasms. What changed: when to take this   famotidine 40 MG tablet Commonly known as: PEPCID Take 40 mg by mouth 2 (two) times daily.   gabapentin 300 MG capsule Commonly known as: NEURONTIN Take 900 mg by mouth 3 (three) times daily.   insulin aspart 100 UNIT/ML injection Commonly known as: novoLOG For insulin pump - administration average 160u over course of day   insulin glargine 100 UNIT/ML injection Commonly known as: LANTUS Inject 15 Units into the skin at bedtime.   insulin lispro 100 UNIT/ML injection Commonly known as: HUMALOG Inject 10 Units into the skin 3 (three) times daily before meals. Sliding scale   lamoTRIgine 100 MG tablet Commonly known as: LAMICTAL Take 1 tablet (100 mg total) by mouth 2 (two) times daily. What changed: how much to take   levETIRAcetam 750 MG tablet Commonly known as: KEPPRA Take 1,500 mg by mouth 2 (two) times daily.   loperamide 2 MG capsule Commonly known as: IMODIUM Take 2 mg by mouth 4 (four) times daily as needed for diarrhea or loose stools.   Maalox Max 1000-60  MG Chew Generic drug: Calcium Carbonate-Simethicone Chew 2 tablets by mouth daily as needed (indigestion).   nicotine 10 MG inhaler Commonly known as: NICOTROL Inhale 1 continuous puffing into the lungs every 2 (two) hours as needed for smoking cessation.   omeprazole 20 MG capsule Commonly known as: PRILOSEC Take 20 mg by mouth 2 (two) times daily before a meal.   ondansetron 4 MG tablet Commonly known as: Zofran Take 1 tablet (4 mg total) by mouth daily as needed for nausea or vomiting.   Ozempic (1 MG/DOSE) 4 MG/3ML Sopn Generic drug: Semaglutide (1 MG/DOSE) Inject 1 mg into the skin once a week.   polyethylene glycol 17 g packet Commonly known as: MIRALAX / GLYCOLAX Take 17 g by mouth daily.   pregabalin 100 MG capsule Commonly known as: LYRICA Take 100 mg by mouth 3 (three) times  daily.   pregabalin 100 MG capsule Commonly known as: LYRICA Take by mouth.   prochlorperazine 5 MG tablet Commonly known as: COMPAZINE Take 5 mg by mouth every 6 (six) hours as needed for nausea or vomiting.   Senexon-S 8.6-50 MG tablet Generic drug: senna-docusate Take 2 tablets by mouth daily.   tacrolimus 0.1 % ointment Commonly known as: PROTOPIC Apply 1 Application topically 2 (two) times daily.   topiramate 100 MG tablet Commonly known as: TOPAMAX Take 1 tablet (100 mg total) by mouth 4 (four) times daily. What changed: when to take this   vitamin B-1 250 MG tablet Take 250 mg by mouth daily.               Durable Medical Equipment  (From admission, onward)           Start     Ordered   12/02/22 1514  For home use only DME Bedside commode  Once       Question:  Sara needs a bedside commode to treat with the following condition  Answer:  Weakness   12/02/22 1513   12/02/22 1512  For home use only DME Walker rolling  Once       Question Answer Comment  Walker: With 5 Inch Wheels   Sara needs a walker to treat with the following condition Weakness       12/02/22 1513            Follow-up Information     [facility] Follow up.   Specialty: Home Health Services Why: The home health agency will contact you for the first home visit Contact information: [address] [place] [address] [phone]         Teresita Madura, MD. Schedule an appointment as soon as possible for a visit in 1 week(s).   Specialty: Family Medicine Contact information: [address] [place] [address] [phone]         Lonell Face, MD. Schedule an appointment as soon as possible for a visit in 1 month(s).   Specialty: Neurology Contact information: [address] [facility] [place] [address] [phone]                 Discharge Instructions     Diet - low sodium heart healthy   Complete by: As directed    Discharge instructions   Complete by: As directed    **IMPORTANT DISCHARGE INSTRUCTIONS**   From Dr. Maryfrances Bunnell: You were admitted for nausea vomiting and also recurrent seizure-like episodes.  Here, the nausea/vomiting improved Your imaging here and lab work showed no concerning cause for the vomiting.  I have sent you a prescription for ondansetron/Zofran 4 mg which you may take up to every 8 hours for nasuea  If the nausea CONTINUES, speak with your primary doctor for advice about what to do next   IMPORTANT:  For the next week, do Sara take your blood pressure medicines amlodipine, HCTZ, losartan and furosemide  Go see your primary doctor next week and get labs NO LATER THAN next Monday  After your doctor sees you, checks your blood pressure, they will tell you when to restart those medicines   For your seizure medicines, RESUME THEM AS YOU WERE TAKING BEFORE If you were taking Keppra and Lamictal before, resume them both If you were just taking Keppra, just resume Keppra  Go see Dr. Sherryll Burger in 4-6 weeks  Resume your insulins as  before without  change   Increase activity slowly   Complete by: As directed        Discharge Exam: Filed Weights   11/30/22 1800 11/30/22 1825 12/01/22 0500  Weight: 85.5 kg 85.5 kg 91.3 kg    General:  Sara [name], Sara [name], Sara [name] Cardiovascular: RRR, nl S1-S2, no murmurs appreciated.   No LE edema.   Respiratory: Normal respiratory rate and rhythm.  CTAB without rales or wheezes. Abdominal: Abdomen soft and non-tender.  No distension or HSM.   Neuro/Psych: Strength symmetric in upper and lower extremities.  Judgment and insight appear normal .   Condition at discharge: good  The results of significant diagnostics from this hospitalization (including imaging, microbiology, ancillary and laboratory) are listed below for reference.   Imaging Studies: Overnight EEG with video  Result Date: 12/01/2022 Charlsie Quest, MD     12/02/2022  9:46 AM Sara [name] MRN: [id number] Epilepsy Attending: Charlsie Quest Referring Physician/Provider: Caryl Pina, MD Duration: 11/30/2022 1936 to 12/01/2022 1936 Sara history: 47 y.o. female with a history of  DM2, Ehlers-Danlos, hypertension, migraine, stroke, seizure disorder. Sara presented secondary to nausea/vomiting in addition to seizure episode. EEG to evaluate for seizure. Level of alertness: Sara [name], asleep AEDs during EEG study: LEV, LCM, TPM, GBP Technical aspects: This EEG study was done with scalp electrodes positioned according to the 10-20 International system of electrode placement. Electrical activity was reviewed with band pass filter of 1-70Hz , sensitivity of 7 uV/mm, display speed of 24mm/sec with a  notched filter applied as appropriate. EEG data were recorded continuously and digitally stored.  Video monitoring was available and reviewed as appropriate. Description: The posterior dominant rhythm consists of 8-9 Hz activity of moderate voltage (25-35 uV) seen predominantly in posterior head regions,  symmetric and reactive to eye opening and eye closing. Sleep was characterized by vertex waves, sleep spindles (12 to 14 Hz), maximal frontocentral region. Sara was noted to have an episode on 11/30/2022 at 2204. Sara was sitting in bed, had head tremor followed by rhythmic whole body twitching, eyes closed. Concomitant EEG before, during and after the event did Sara show any EEG changes suggest seizure. Event ended at 2209. Sara was noted to have an episode on 12/01/2022 at 0950. Sara was sitting in bed, had side to side head tremor followed by rhythmic whole body twitching, eyes closed.  There was significant electrode artifact making it difficult to interpret. After filtering for artifact, concomitant EEG before, during and after the event did Sara show any EEG changes to suggest seizure. Sara was noted to have an episode on 12/01/2022 at 1206. Sara was sitting in bed, violently shaking in bed.There was significant electrode artifact making it difficult to interpret. After filtering for artifact, concomitant EEG before, during and after the event did Sara show any EEG changes to suggest seizure. Hyperventilation and photic stimulation were Sara performed.   IMPRESSION: This study is within normal limits. No seizures or epileptiform discharges were seen throughout the recording. Three events were recorded as described above without  concomitant EEG change. These events were NON-EPILEPTIC. A normal interictal EEG does Sara exclude nor support the diagnosis of epilepsy. Charlsie Quest   MR BRAIN WO CONTRAST  Result Date: 11/29/2022 CLINICAL DATA:  Seizure, refractory (Ped 0-17y) EXAM: MRI HEAD WITHOUT CONTRAST TECHNIQUE: Multiplanar, multiecho pulse sequences of the brain and surrounding structures were obtained without intravenous contrast. COMPARISON:  CT head November 27, 2022. FINDINGS: Brain: No acute  infarction, hemorrhage, hydrocephalus, extra-axial collection or mass lesion. Mild for age  scattered small T2/FLAIR hyperintensities within the white matter, nonspecific but compatible with mild chronic microvascular ischemic change. Hippocampi are symmetric and within normal limits. Vascular: Major arterial flow voids are maintained skull base. Skull and upper cervical spine: Normal marrow signal.  ACDF. Sinuses/Orbits: Clear sinuses.  No acute orbital findings. Other: No mastoid effusions. IMPRESSION: Unremarkable brain MRI for Sara age.  No acute abnormality. Electronically Signed   By: Feliberto Harts M.D.   On: 11/29/2022 17:46   CT Head Wo Contrast  Result Date: 11/27/2022 CLINICAL DATA:  Mental status change, unknown cause. EXAM: CT HEAD WITHOUT CONTRAST TECHNIQUE: Contiguous axial images were obtained from the base of the skull through the vertex without intravenous contrast. RADIATION DOSE REDUCTION: This exam was performed according to the departmental dose-optimization program which includes automated exposure control, adjustment of the mA and/or kV according to Sara size and/or use of iterative reconstruction technique. COMPARISON:  05/29/2022 FINDINGS: Brain: No acute intracranial abnormality. Specifically, no hemorrhage, hydrocephalus, mass lesion, acute infarction, or significant intracranial injury. Vascular: No hyperdense vessel or unexpected calcification. Skull: No acute calvarial abnormality. Sinuses/Orbits: No acute findings Other: None IMPRESSION: No acute intracranial abnormality. Electronically Signed   By: Charlett Nose M.D.   On: 11/27/2022 02:12   CT ABDOMEN PELVIS W CONTRAST  Result Date: 11/25/2022 CLINICAL DATA:  Abdominal pain with nausea and vomiting for 2 days, initial encounter EXAM: CT ABDOMEN AND PELVIS WITH CONTRAST TECHNIQUE: Multidetector CT imaging of the abdomen and pelvis was performed using the standard protocol following bolus administration of intravenous contrast. RADIATION DOSE REDUCTION: This exam was performed according to the departmental  dose-optimization program which includes automated exposure control, adjustment of the mA and/or kV according to Sara size and/or use of iterative reconstruction technique. CONTRAST:  OMNIPAQUE IOHEXOL 300 MG/ML  SOLN COMPARISON:  03/29/2022 FINDINGS: Lower chest: No acute abnormality. Hepatobiliary: Mild fatty infiltration of the liver is noted. The gallbladder is within normal limits. Pancreas: Unremarkable. No pancreatic ductal dilatation or surrounding inflammatory changes. Spleen: Normal in size without focal abnormality. Adrenals/Urinary Tract: Adrenal glands are within normal limits. Kidneys are well visualize within normal enhancement pattern. No renal calculi or obstructive changes are seen. The bladder is partially distended. Stomach/Bowel: Colon shows no obstructive or inflammatory changes. The appendix is within normal limits. Small bowel and stomach are within normal limits. Vascular/Lymphatic: Aortic atherosclerosis. No enlarged abdominal or pelvic lymph nodes. Reproductive: Uterus has been removed. Bilateral adnexal cysts are seen. The largest of these measures 3.6 cm on the right slightly enlarged when compared with the prior exam. Other: No abdominal wall hernia or abnormality. No abdominopelvic ascites. Musculoskeletal: No acute or significant osseous findings. IMPRESSION: Bilateral adnexal cysts measuring up to 3.6 cm. These are slightly more prominent than that seen on the prior exam. No follow-up imaging recommended. Note: This recommendation does Sara apply to premenarchal patients and to those with increased risk (genetic, family history, elevated tumor markers or other high-risk factors) of ovarian cancer. Reference: JACR 2020 Feb; 17(2):248-254 Fatty liver. No other focal abnormality is noted. Electronically Signed   By: Alcide Clever M.D.   On: 11/25/2022 23:42    Microbiology: Results for orders placed or performed during the hospital encounter of 05/29/22  Blood culture  (routine x 2)     Status: None   Collection Time: 05/29/22  1:58 PM   Specimen: BLOOD  Result Value Ref Range Status   Specimen Description  BLOOD SITE Sara SPECIFIED  Final   Special Requests   Final    BOTTLES DRAWN AEROBIC AND ANAEROBIC Blood Culture adequate volume   Culture   Final    NO GROWTH 5 DAYS Performed at [facility], [address] [place], [place] [address]    Report Status 06/03/2022 FINAL  Final  Blood culture (routine x 2)     Status: None   Collection Time: 05/29/22  2:38 PM   Specimen: BLOOD  Result Value Ref Range Status   Specimen Description BLOOD SITE Sara SPECIFIED  Final   Special Requests   Final    BOTTLES DRAWN AEROBIC AND ANAEROBIC Blood Culture adequate volume   Culture   Final    NO GROWTH 5 DAYS Performed at [facility], [address] [place], [place] [address]    Report Status 06/03/2022 FINAL  Final    Labs: CBC: Recent Labs  Lab 11/25/22 2140 11/27/22 0009 11/27/22 0745 11/28/22 0507 12/01/22 0851 12/02/22 0415  WBC 10.1 9.1 7.3 6.6 7.9 6.8  NEUTROABS 8.3* 6.8  --   --   --   --   HGB 12.5 11.5* 11.1* 10.1* 11.6* 10.4*  HCT 41.0 38.1 36.0 33.1* 37.3 34.3*  MCV 79.8* 80.0 79.8* 80.7 78.2* 79.4*  PLT 270 258 201 202 219 161   Basic Metabolic Panel: Recent Labs  Lab 11/25/22 2140 11/27/22 0009 11/27/22 0745 12/01/22 0851 12/02/22 0415  NA 134* 134* 133* 136 138  K 3.6 3.8 3.6 3.7 4.3  CL 97* 98 101 108 110  CO2 24 23 23  20* 17*  GLUCOSE 288* 249* 213* 173* 166*  BUN 16 16 12 15  25*  CREATININE 1.00 0.77 0.77 0.97 1.27*  CALCIUM 9.1 8.6* 8.1* 9.1 8.6*  MG  --   --  2.2 2.2 2.1  PHOS  --   --  2.9 3.6 4.3   Liver Function Tests: Recent Labs  Lab 11/25/22 2140 11/27/22 0009 11/27/22 0745 12/01/22 0851 12/02/22 0415  AST 23 24 16  35 25  ALT 20 19 17  39 35  ALKPHOS 90 91 79 80 68  BILITOT 0.9 0.7 0.6 0.4 0.4  PROT 8.4* 8.0 7.1 7.5 6.0*  ALBUMIN 4.1 3.9 3.5 3.4* 2.9*   CBG: Recent Labs  Lab  11/30/22 2116 12/01/22 0622 12/01/22 1650 12/01/22 2206 12/02/22 1216  GLUCAP 189* 164* 162* 196* 157*    Discharge time spent: approximately 35 minutes spent on discharge counseling, evaluation of Sara on day of discharge, and coordination of discharge planning with nursing, social work, pharmacy and case management  Signed: Alberteen Sam, MD [facility] 12/02/2022

## 2022-12-02 NOTE — Progress Notes (Signed)
    Durable Medical Equipment  (From admission, onward)           Start     Ordered   12/02/22 1514  For home use only DME Bedside commode  Once       Question:  Patient needs a bedside commode to treat with the following condition  Answer:  Weakness   12/02/22 1513   12/02/22 1512  For home use only DME Walker rolling  Once       Question Answer Comment  Walker: With 5 Inch Wheels   Patient needs a walker to treat with the following condition Weakness      12/02/22 1513             The patient requires a bedside commode as she is unable to make it to the restroom in a timely manner on the level of the home in which she will be staying.

## 2022-12-02 NOTE — Evaluation (Addendum)
Occupational Therapy Evaluation Patient Details Name: Sara [name] MRN: [id number] DOB: 1975/11/07 Today's Date: 12/02/2022   History of Present Illness Pt is a 47 y/o female presenting on 4/11 to [facility] with nausea, vomiting, and breakthrough seizure. CT and MRI negative for acute finding. Transferred to [facility] for LTM EEG. EEG WNL. PMH includes: arthritis, blind L eye, DM-1, epilespy, HTN, seizures, CVA with L hemiparesis, ehlers-danlos syndrome.   Clinical Impression   PTA patient reports independent with ADLs, IADLs and mobility. Admitted for above and presents with problem list below.  She demonstrates ability to complete bed mobility with min assist, transfers with min-mod assist using RW, functional mobility in room using RW with min assist +2 for RW mgmt as pt tends to veer R with visual deficits, and requires min to mod assist for ADLs. She requires frequent redirection to task, demonstrates difficulty following multiple step commands and problem solving.  Max cueing at times due to visual deficits. Becomes labile at end of session, not wanting to rely on family for some assist.  Based on performance today, believe she will best benefit from continued OT services acutely and after dc at inpatient setting with <3hrs/day to optimize independence, safety and return to PLOF.      Recommendations for follow up therapy are one component of a multi-disciplinary discharge planning process, led by the attending physician.  Recommendations may be updated based on patient status, additional functional criteria and insurance authorization.   Assistance Recommended at Discharge Frequent or constant Supervision/Assistance  Patient can return home with the following A lot of help with walking and/or transfers;A lot of help with bathing/dressing/bathroom;Assistance with cooking/housework;Assist for transportation;Help with stairs or ramp for entrance;Direct supervision/assist for financial  management;Direct supervision/assist for medications management    Functional Status Assessment  Patient has had a recent decline in their functional status and demonstrates the ability to make significant improvements in function in a reasonable and predictable amount of time.  Equipment Recommendations  BSC/3in1;Other (comment) (RW)    Recommendations for Other Services       Precautions / Restrictions Precautions Precautions: Fall Precaution Comments: seizure, visual deficits (blind L eye, blurry vision R eye) Restrictions Weight Bearing Restrictions: No      Mobility Bed Mobility Overal bed mobility: Needs Assistance Bed Mobility: Supine to Sit     Supine to sit: Min assist     General bed mobility comments: trunk support to ascend, cueing to scoot hips forward    Transfers Overall transfer level: Needs assistance Equipment used: Rolling walker (2 wheels), None Transfers: Sit to/from Stand Sit to Stand: Mod assist, Min assist, +2 safety/equipment           General transfer comment: initally mod assist to power up partially from EOB, required RW to fully power up with min assist.      Balance Overall balance assessment: Needs assistance Sitting-balance support: No upper extremity supported, Feet supported Sitting balance-Leahy Scale: Fair     Standing balance support: Bilateral upper extremity supported, During functional activity Standing balance-Leahy Scale: Poor Standing balance comment: relies on RW                           ADL either performed or assessed with clinical judgement   ADL Overall ADL's : Needs assistance/impaired     Grooming: Minimal assistance;Sitting           Upper Body Dressing : Minimal assistance;Sitting   Lower Body Dressing: Moderate assistance;Sit  to/from stand Lower Body Dressing Details (indicate cue type and reason): able to don socks figure 4 sitting EOB with cueing for technique, relies on BUE support  in standing Toilet Transfer: Minimal assistance;Ambulation;+2 for safety/equipment;Rolling walker (2 wheels)           Functional mobility during ADLs: Minimal assistance;Rolling walker (2 wheels);+2 for safety/equipment       Vision Baseline Vision/History: 2 Legally blind Ability to See in Adequate Light: 3 Highly impaired Patient Visual Report: Other (comment) (L eye blind baseline, R eye blurry) Additional Comments: pt able to locate therapists but tends to close eyes.  She tends to overshoot when reaching for items and reports diplopia, but reports this has been her baseline?  Will continue to assess     Perception     Praxis      Pertinent Vitals/Pain Pain Assessment Pain Assessment: Faces Faces Pain Scale: Hurts a little bit Pain Location: R foot (hx of Charcot) Pain Descriptors / Indicators: Discomfort Pain Intervention(s): Monitored during session, Repositioned     Hand Dominance Right   Extremity/Trunk Assessment Upper Extremity Assessment Upper Extremity Assessment: Defer to OT evaluation   Lower Extremity Assessment Lower Extremity Assessment: RLE deficits/detail;LLE deficits/detail RLE Deficits / Details: Hx Charcot foot per patient. Grossly 4/5 strength RLE Sensation: WNL LLE Deficits / Details: Grossly 4/5 LLE Sensation: WNL   Cervical / Trunk Assessment Cervical / Trunk Assessment: Normal   Communication Communication Communication: No difficulties   Cognition Arousal/Alertness: Awake/alert Behavior During Therapy: Anxious Overall Cognitive Status: Impaired/Different from baseline Area of Impairment: Attention, Memory, Following commands, Safety/judgement, Awareness, Problem solving                   Current Attention Level: Sustained Memory: Decreased short-term memory Following Commands: Follows one step commands consistently, Follows one step commands with increased time, Follows multi-step commands inconsistently Safety/Judgement:  Decreased awareness of deficits, Decreased awareness of safety Awareness: Emergent Problem Solving: Slow processing, Difficulty sequencing, Requires verbal cues General Comments: patient following simple commands with increased time, difficulty with multiple step commands and looses attention to task frequently.  Requires cueing to problem solve. Becomes labile when reporting she does not want to rely on her family at home and she would rather go to a factility.     General Comments  VSS on RA    Exercises     Shoulder Instructions      Home Living Family/patient expects to be discharged to:: Private residence Living Arrangements: Spouse/significant other;Other (Comment) (2 daughters) Available Help at Discharge: Family;Available PRN/intermittently Type of Home: Mobile home Home Access: Stairs to enter Entrance Stairs-Number of Steps: 5 Entrance Stairs-Rails: Left (front door; R railing on back steps) Home Layout: One level     Bathroom Shower/Tub: Chief Strategy Officer: Standard     Home Equipment: Crutches;Other (comment);Tub bench (suction cup grab bars)   Additional Comments: Dog Jasper      Prior Functioning/Environment Prior Level of Function : Independent/Modified Independent             Mobility Comments: Returned to independence after prior stroke ADLs Comments: independent ADLs, IADLs        OT Problem List: Decreased strength;Decreased activity tolerance;Impaired balance (sitting and/or standing);Impaired vision/perception;Decreased knowledge of precautions;Decreased knowledge of use of DME or AE;Decreased safety awareness;Decreased cognition;Decreased coordination      OT Treatment/Interventions: Self-care/ADL training;Therapeutic exercise;DME and/or AE instruction;Therapeutic activities;Neuromuscular education;Cognitive remediation/compensation;Visual/perceptual remediation/compensation;Patient/family education;Balance training    OT  Goals(Current goals can be found  in the care plan section) Acute Rehab OT Goals Patient Stated Goal: get better and take care of myself OT Goal Formulation: With patient Time For Goal Achievement: 12/16/22 Potential to Achieve Goals: Good ADL Goals Pt Will Perform Grooming: with supervision;standing Pt Will Perform Lower Body Dressing: with supervision;sit to/from stand Pt Will Transfer to Toilet: with supervision;ambulating Pt Will Perform Toileting - Clothing Manipulation and hygiene: with supervision;sit to/from stand Additional ADL Goal #1: Pt will utilize visual compensatory techniques to navigate enviorment and avoid obstacles with no more than minimal cueing.  OT Frequency: Min 2X/week    Co-evaluation PT/OT/SLP Co-Evaluation/Treatment: Yes Reason for Co-Treatment: Complexity of the patient's impairments (multi-system involvement);For patient/therapist safety;Necessary to address cognition/behavior during functional activity;To address functional/ADL transfers PT goals addressed during session: Mobility/safety with mobility OT goals addressed during session: ADL's and self-care      AM-PAC OT "6 Clicks" Daily Activity     Outcome Measure Help from another person eating meals?: A Little Help from another person taking care of personal grooming?: A Little Help from another person toileting, which includes using toliet, bedpan, or urinal?: A Lot Help from another person bathing (including washing, rinsing, drying)?: A Lot Help from another person to put on and taking off regular upper body clothing?: A Little Help from another person to put on and taking off regular lower body clothing?: A Lot 6 Click Score: 15   End of Session Equipment Utilized During Treatment: Rolling walker (2 wheels);Gait belt Nurse Communication: Mobility status  Activity Tolerance: Patient tolerated treatment well Patient left: in chair;with call bell/phone within reach;with chair alarm set  OT Visit  Diagnosis: Other abnormalities of gait and mobility (R26.89);Muscle weakness (generalized) (M62.81);Other symptoms and signs involving cognitive function;History of falling (Z91.81)                Time: 1610-9604 OT Time Calculation (min): 30 min Charges:  OT General Charges $OT Visit: 1 Visit OT Evaluation $OT Eval Moderate Complexity: 1 Mod  Barry Brunner, OT Acute Rehabilitation Services Office [phone]   Chancy Milroy 12/02/2022, 1:53 PM

## 2022-12-02 NOTE — Assessment & Plan Note (Signed)
BMI 36.8 with comorbid diabetes, GERD, and knee hypertension.

## 2022-12-02 NOTE — Progress Notes (Addendum)
  Progress Note   Patient: Sara [name]:[id number] DOB: [birth year] DOA: 11/26/2022     5 DOS: the patient was seen and examined on 12/02/2022 at 10:30AM      Brief hospital course: Mrs. Sara [name] is a 47 y.o. F with HTN, migraines, hx CVA, obesity, who presented with nausea/vomiting and a seizure episode.          Assessment and Plan: * Conversion disorder presenting as seizure-like episode Patient admitted with recurrent observed seizure-like episodes, continuing daily in the hospital.  Neuraxial imaging unremarkable.    cEEG obtained, showed no epileptiform correlate with seizure-like episodes. - Continue home AEDs, Keppra and Lamictal    Intractable nausea and vomiting This was present on admission.  Did not improve initially with anti-emetics.   Electrolytes and renal function normal.  Vomited this morning once, but otherwise has tolerated diet over the last 48 hours  Cerebrovascular disease with chronic left hemiparesis History stroke and residual hemiparesis and paresthesia.  Here, reports she is weaker than baseline.  Neuraxial imaging normal.  No evidence of infection or electrolyte abnormality. - Continue aspirin - Resume atorvastatin  Essential hypertension BP soft overnight - Hold amlodipine, losartan, HCTZ  Normocytic anemia - Follow up with PCP  Nonintractable chronic migraine - Continue Topamax  Type 1 diabetes mellitus with hyperglycemia Glucose controlled - Continue glargine - Continue SS corrections - Hold semaglutide  Gastroesophageal reflux disease - Continue PPI, change to oral  Chronic pain syndrome - Continue gabapentin, Robaxin - Continue Topamax for migraine prevention  Asthma, mild intermittent Asymptomatic  Morbid obesity BMI 36.8 with comorbid diabetes, GERD, and knee hypertension.          Subjective: Patient reports headache, chest pain, weakness, vomiting, but also good oral intake.   Patient appears  comfortable, just sleepy.       Physical Exam: BP 127/87 (BP Location: Right Arm)   Pulse (!) 104   Temp 98.5 F (36.9 C) (Oral)   Resp 19   Ht  (1.575 m)   Wt 91.3 kg   SpO2 100%   BMI 36.81 kg/m   Obese adult female, lying in bed, no acute distress Tachcyardic, regular no murmurs, no peripheral edema Respiratory rate normal, lungs clear without rales or wheezes Abdomen soft without tenderness palpation or guarding, no ascites or distention Responds to questions, makes eye contact, oriented to person, place, time, and situation, has generalized weakness, she has some mild increased left side weakness relative to right, hard to tell if this is from effort    Data Reviewed: Hemoglobin 10.4, no change Creatinine slightly up to 1.2, phosphorus and magnesium normal Iron studies normal, B12 and folate normal Telemetry shows some sinus tachycardia intermittently    Family Communication: None present    Disposition: Status is: Inpatient Patient was admitted with conversion disorder causing recurrent seizure-like episodes  She was evaluated by neurology we have resumed her home medicines  She may not drive for 6 months per neurology, she is at this time medically ready for discharge, although too weak to return home alone safely.  Social work arranging for rehabilitation          Author: Alberteen Sam, MD 12/02/2022 1:22 PM  For on call review [email or web address].

## 2022-12-02 NOTE — Progress Notes (Signed)
Discharge education completed. Pr reports understanding education. All belongings from beside home with pt. Pt taken via wheelchair to exit.

## 2022-12-02 NOTE — Assessment & Plan Note (Signed)
Glucose controlled - Continue glargine - Continue SS corrections - Hold semaglutide

## 2022-12-02 NOTE — Assessment & Plan Note (Signed)
Continue Topamax.

## 2022-12-02 NOTE — Assessment & Plan Note (Addendum)
BP soft overnight - Hold amlodipine, losartan, HCTZ

## 2022-12-02 NOTE — Procedures (Addendum)
Patient Name: Sara [name]  MRN: [id number]  Epilepsy Attending: Charlsie Quest  Referring Physician/Provider: Caryl Pina, MD  Duration: 12/01/2022 1936 to 12/02/2022 1036   Patient history: 47 y.o. female with a history of  DM2, Ehlers-Danlos, hypertension, migraine, stroke, seizure disorder. Patient presented secondary to nausea/vomiting in addition to seizure episode. EEG to evaluate for seizure.   Level of alertness: Awake, asleep   AEDs during EEG study: LEV, LCM, TPM, GBP   Technical aspects: This EEG study was done with scalp electrodes positioned according to the 10-20 International system of electrode placement. Electrical activity was reviewed with band pass filter of 1-70Hz , sensitivity of 7 uV/mm, display speed of 70mm/sec with a  notched filter applied as appropriate. EEG data were recorded continuously and digitally stored.  Video monitoring was available and reviewed as appropriate.   Description: The posterior dominant rhythm consists of 8-9 Hz activity of moderate voltage (25-35 uV) seen predominantly in posterior head regions, symmetric and reactive to eye opening and eye closing. Sleep was characterized by vertex waves, sleep spindles (12 to 14 Hz), maximal frontocentral region.     Patient was noted to have an episode on 12/01/2022 at 2105. Patient was sitting in bed, violently shaking in bed, flailing arms. Concomitant EEG before, during and after the event did not show any EEG changes to suggest seizure.  Patient was noted to have an episode on 12/02/2022 at 0425. Patient was sitting in bed, violently shaking in bed. There was significant electrode artifact making it difficult to interpret. After filtering for artifact, concomitant EEG before, during and after the event did not show any EEG changes to suggest seizure.    Hyperventilation and photic stimulation were not performed.      IMPRESSION: This study is within normal limits. No seizures or  epileptiform discharges were seen throughout the recording.   Two events were recorded as described above without  concomitant EEG change. These events were NON-EPILEPTIC.    A normal interictal EEG does not exclude nor support the diagnosis of epilepsy.   [name]

## 2022-12-04 LAB — GLUCOSE, CAPILLARY
Glucose-Capillary: 221 mg/dL — ABNORMAL HIGH (ref 70–99)
Glucose-Capillary: 189 mg/dL — ABNORMAL HIGH (ref 70–99)

## 2022-12-16 ENCOUNTER — Encounter: Payer: Self-pay | Admitting: Occupational Therapy

## 2022-12-16 ENCOUNTER — Ambulatory Visit: Payer: Medicare Other | Attending: Family Medicine | Admitting: Occupational Therapy

## 2022-12-16 DIAGNOSIS — R41842 Visuospatial deficit: Secondary | ICD-10-CM | POA: Insufficient documentation

## 2022-12-16 NOTE — Therapy (Unsigned)
OUTPATIENT OCCUPATIONAL THERAPY LOW VISION EVALUATION  Patient Name: Sara [name] MRN: [id number] DOB:25-Jun-1976, 47 y.o., female Today's Date: 12/16/2022  PCP: Teresita Madura, MD  REFERRING PROVIDER: Teresita Madura, MD   END OF SESSION:  OT End of Session - 12/16/22 1349     Visit Number 1    Number of Visits 9    Date for OT Re-Evaluation 02/26/23    Authorization Type [employer] Medicare - medical necessity    OT Start Time 1409    OT Stop Time 1535    OT Time Calculation (min) 86 min    Activity Tolerance Patient tolerated treatment well    Behavior During Therapy WFL for tasks assessed/performed             Past Medical History:  Diagnosis Date   Adopted    Arthritis    hips, lower back   Asthma    Blind left eye    Complication of anesthesia    Was able to feel procedure during neck surgery   Diabetes mellitus without complication (HCC)    Ehlers-Danlos disease    Epilepsy (HCC)    Hypertension    Migraine headache    approx 6x yr (now getting injections from neurology)   Seizures (HCC)    most recent 05/2020   Stroke (HCC) 10/2020   Past Surgical History:  Procedure Laterality Date   CARPAL TUNNEL RELEASE     CATARACT EXTRACTION W/PHACO Right 06/18/2020   Procedure: CATARACT EXTRACTION PHACO AND INTRAOCULAR LENS PLACEMENT (IOC) RIGHT DIABETIC 13.23 01:42.7;  Surgeon: Galen Manila, MD;  Location: [facility];  Service: Ophthalmology;  Laterality: Right;  Diabetic - insulin   CATARACT EXTRACTION W/PHACO Left 07/09/2020   Procedure: CATARACT EXTRACTION PHACO AND INTRAOCULAR LENS PLACEMENT (IOC) LEFT DIABETIC 9.99 01:19.3 ;  Surgeon: Galen Manila, MD;  Location: [facility];  Service: Ophthalmology;  Laterality: Left;  Diabetic - insulin   CERVICAL FUSION     CESAREAN SECTION  x3   HIP ARTHROSCOPY     PARTIAL HYSTERECTOMY     Patient Active Problem List   Diagnosis Date Noted   Intractable nausea and vomiting  12/02/2022   Cerebrovascular disease with chronic left hemiparesis 12/02/2022   Nonintractable chronic migraine 12/02/2022   Normocytic anemia 12/02/2022   Essential hypertension 12/02/2022   Conversion disorder presenting as seizure-like episode 11/27/2022   Morbid obesity (HCC) 04/01/2022   Asthma, mild intermittent 04/01/2022   Vitamin B 12 deficiency 04/01/2022   Vitamin D deficiency 04/01/2022   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 without macular edema, with long-term current use of insulin 04/01/2022   Gastroesophageal reflux disease 03/17/2022   Foot cramps 02/10/2022   Leg swelling 10/31/2021   Type 1 diabetes mellitus with hyperglycemia (HCC) 03/03/2021   Osteoarthritis of both hips 02/14/2021   Retinopathy 02/14/2021   Nicotine dependence 11/05/2020   Scalp abscess 05/28/2020   Seizure (HCC) 03/12/2018   Hyperlipidemia 09/20/2017   Hypertension 09/20/2017   Influenza A 09/20/2017   Status asthmaticus 09/20/2017   Tobacco abuse 09/20/2017   Subclinical hyperthyroidism 02/27/2017   Abscess 05/27/2016   Chondromalacia of right patella 04/29/2016   Primary osteoarthritis of right knee 04/29/2016   Bilateral hand pain 01/28/2016   Primary open angle glaucoma of both eyes, mild stage 01/09/2016   Secondary cataract of both eyes 01/09/2016   Chondromalacia, right knee 11/09/2015   H. pylori infection 06/23/2015   Pruritic rash 06/20/2015   Low  back pain 05/22/2015   Type 1 diabetes mellitus (HCC) 04/04/2015   Epilepsy (HCC) 04/04/2015   Muscle spasm of calf 04/04/2015   Ehlers-Danlos syndrome type III 04/04/2015   Lower leg pain 04/04/2015   Migraine 04/04/2015   Muscle cramps 12/12/2014   Major depressive disorder, recurrent episode, moderate with anxious distress (HCC) 08/27/2014   Bilateral knee pain 08/24/2014   Carpal tunnel syndrome of right wrist 07/22/2014   Insulin long-term use (HCC) 07/11/2014    Right hand pain 06/29/2014   Neck pain 02/10/2014   Intractable chronic migraine without aura 10/24/2013   Cervical herniated disc 10/20/2013   Folliculitis 09/21/2013   Ataxia 07/31/2013   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   Hyperglycemia 10/27/2012   DDD (degenerative disc disease), lumbosacral 07/05/2012   Sacroiliitis, not elsewhere classified (HCC) 05/09/2012   Chronic pain syndrome 03/15/2012   Diabetic neuropathy (HCC) 03/15/2012   Encounter for therapeutic drug monitoring 03/15/2012    ONSET DATE: 10/27/20 Referral date: 10/19/22  REFERRING DIAG: H35.00 (ICD-10-CM) - Retinopathy   THERAPY DIAG:  Visuospatial deficit  Rationale for Evaluation and Treatment: Rehabilitation  SUBJECTIVE:   SUBJECTIVE STATEMENT: Patient reports the following regarding her vision: Lost vision s/p retinal rupture day of stroke in 2022 and lost vision at that time in her L eye.  Right eye gets floaters, can be blurry at time when trying to compensate for L eye vision loss.  Keeps rewetting drops with her due to dry eyes. Patient wants to be as independent as possible and reports wanting to be able to drive again.  Pt accompanied by: self and significant other Fiance ~ [place]  PERTINENT HISTORY: Patient is a 47 year old female with medical history including Charcot neuroarthropathy, Ehlers-Danlos syndrome, arthritis and type 1 diabetic with Dexcom device.  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  Seizure disorder and left eye blindness impairing ADLs and IADLs.  She has been dealing with multiple ophthalmologic issues that appear to affect her quality of life and ability to conduct ADLs as well. B carpal tunnel release 2015, B cataract surgery prior to stroke, C3-4 diskectomy, and previous smoker who quit 09/30/22.   PAIN:  Are you having pain? No  FALLS: Has patient fallen in last 6 months? Yes. Number of falls 1 with  syncopal episode  LIVING ENVIRONMENT: Lives with: lives with their family Lives in: Mobile home Stairs: Yes: External: 5 steps; on left going up Has following equipment at home: Single point cane, Walker - 2 wheeled, bed side commode, suction cup grab bars, and built-in shower seat  PLOF:  No longer working - on disability; enjoys cleaning, baking, and cooking  PATIENT GOALS: Wants to be able to read her Bible, be able to pour her cup of coffee with decreased spillage and risk for burning her finger  OBJECTIVE:   GLASSES: Pt has prism glasses, which she wears all the time  CURRENT MODIFICATIONS/ADAPTIONS: large print on phone and iPad; voice to text and Siri; apple watch  DEVICES: iPad (Candy[employer]ush)  PHONE: Apple   HAND DOMINANCE: Right  ADLs: Overall ADLs: Patient reports generally MI with bathing, dressing, toileting etc.  She does need occasional needing help to apply makeup at times ie) applying eyebrows, putting earring in L ear, combing her hair on, tying shoe on L side with laces centered  IADLs: Pet Care: New Dog Evaristo Bury 07/2022 Shopping: Always accompanied by her fiance or children Light housekeeping: Shares  responsibility with her daughters  She can't see well enough to sweep the floor thoroughly.  Got a smaller vacuum that works on carpet and hard floors. Meal Prep: Has difficulty with filling things with liquid but uses her finger to gauge amount but still frequently makes a mess. Community mobility: Dependent  Medication management: Assisted by family Financial management: No inquiry at time of evaluation  Handwriting:  Not observed at time of evaluation  MOBILITY STATUS: Independent and uses cane in the community and handheld assistance from family members as needed  FUNCTIONAL OUTCOME MEASURES:  Patient Specific Functional Scale (PSFS)   Total score = sum of the activity scores/number of activities Minimum detectable change (90%CI) for average score = 2  points Minimum detectable change (90%CI) for single activity score = 3 points   COGNITION: Overall cognitive status: Within functional limits for tasks assessed  VISUAL FINDINGS: Baseline vision: Wears glasses all the time and Legally blind Visual history: cataracts, retinopathy, and had B cataract surgery  Impaired Depth perception: Impaired, patient reports difficulty with placement of items on table, pouring liquids etc Denies Diplopia   Left -- light perception, mostly superior nasal region Right -- distant vision: patient observed with lots of blinking and squinting to identify 3 of 5 numbers at 20/160  Reading -- near vision: patient observed to move reading material to her R side.  Holds card only a few inches from her face. On reading card 2.0 - is comfortable for reading but she can read down to 1.0 with increased time, frequent adjusting and squinting to focus.  OBSERVATIONS: Patient arrived with her fianc for vision evaluation today.  Patient is well put together and eager to get feedback on the visual compensatory strategies.  Patient arrived with a cane for mobility purposes which she uses in the community but not generally in home environment.  Patient presents with altered/cautious gait and new environment.  Patient has obvious compensatory strategies for vision including bringing items closer to her face, moving items to her right side as well as seeking input from family members to read the printed materials.  Patient's glasses, which she wears all the time, do not appear to be progressive but potentially have a prism in the lenses.   TODAY'S TREATMENT:                                                                                                                               OT educated patient on some general vision compensatory techniques including increased/high contrast, specific areas for items (especially in kitchen s/p dropping items when attempting to place them on  the counter).  Education was provided on compensatory tools such as a liquid indicator for edge of cup with trial conducted with hand over hand guidance and CGA to avoid startling with the sound.  Patient encouraged to use B hands to improve safety with functional table top activities and accurate location of items secondary to vision  loss.  PATIENT EDUCATION: Education details: OT Role and POC and initiated visual comp strategies Person educated: Patient and fiance Education method: Verbal Explanation  Education comprehension: verbalized understanding and needs further education with formal handouts for compensatory and adaptive skills  HOME EXERCISE PROGRAM: Not yet initiated - suggestions made but no formal written instructions provided.   GOALS:  SHORT TERM GOALS: Target date: 01/13/2023    Patient will independently recall at least 2 compensatory strategies for visual impairment without cueing. Baseline: New to [facility] Goal status: INITIAL  2.  Patient will return demonstration of visual adaptation use and verbalize understanding of how to obtain item(s) if desired.  Baseline: Patient identifies multiple areas of concern including dropping and breaking cup Goal status: INITIAL  3.  Patient will report good use of AE to safely pour liquids into containers with no spillage for daily coffee 2x/day. Baseline: Currently using her finger to gauge amount of liquid in cups. Goal status: INITIAL   LONG TERM GOALS: Target date: 02/26/2023  Patient will be able to use visual comp strategies to read Bible verses and Bible study lessons. Baseline: Patient reports 0/10 ability to read her Bible. Goal status: INITIAL  2.  Patient will report at least two-point increase in average PSFS score or at least three-point increase in a single activity score indicating functionally significant improvement given minimum detectable change.  Baseline: 1.7 total score (See above for individual  activity scores)  Goal status: INITIAL   ASSESSMENT:  CLINICAL IMPRESSION: Patient is a 47 y.o. y.o. female who was seen today for occupational therapy evaluation for assessment of vision related impairments to ADLs and IADLs. Hx includes CVA and DM with retinopathy. Patient currently reports difficulty reading her Bible, viewing subtitles and pouring drinks without spillage as a part of functional deficits and impairments as noted below. Pt to benefit from skilled OT services for education on visual impairment(s), compensatory and safety strategies, and use of AD as needed for more independent and safe completion of daily occupations.   PERFORMANCE DEFICITS: in functional skills including ADLs, IADLs, coordination, dexterity, proprioception, Fine motor control, Gross motor control, mobility, vision, UE functional use, and decreased knowledge of visual compensatory strategies .   IMPAIRMENTS: are limiting patient from ADLs, IADLs, leisure, social participation, and overall safety with handling hot/breakable objects .   CO-MORBIDITIES: has co-morbidities such as h/o CVA, seizures, DM with retinopathy  that affects occupational performance. Patient will benefit from skilled OT to address above impairments and improve overall function.  MODIFICATION OR ASSISTANCE TO COMPLETE EVALUATION: Min-Moderate modification of tasks or assist with assess necessary to complete an evaluation.  OT OCCUPATIONAL PROFILE AND HISTORY: Detailed assessment: Review of records and additional review of physical, cognitive, psychosocial history related to current functional performance.  CLINICAL DECISION MAKING: Moderate - several treatment options, min-mod task modification necessary  REHAB POTENTIAL: Good for established goals  EVALUATION COMPLEXITY: Moderate    PLAN:  OT FREQUENCY: 1x/week  OT DURATION: 8 weeks  PLANNED INTERVENTIONS: self care/ADL training, therapeutic exercise, therapeutic activity,  functional mobility training, patient/family education, visual/perceptual remediation/compensation, coping strategies training, DME and/or AE instructions, and Re-evaluation  RECOMMENDED OTHER SERVICES: None at this time  CONSULTED AND AGREED WITH PLAN OF CARE: Patient and fiance  PLAN FOR NEXT SESSION: Go over low vision packet, review adaptive equipment/techniques, ask patient to bring iPad for future session   Victorino Sparrow, OT 12/16/2022, 3:46 PM

## 2022-12-17 ENCOUNTER — Ambulatory Visit (INDEPENDENT_AMBULATORY_CARE_PROVIDER_SITE_OTHER): Payer: Medicare Other | Admitting: Podiatry

## 2022-12-17 ENCOUNTER — Other Ambulatory Visit: Payer: Self-pay

## 2022-12-17 DIAGNOSIS — M14671 Charcot's joint, right ankle and foot: Secondary | ICD-10-CM

## 2022-12-17 DIAGNOSIS — E11610 Type 2 diabetes mellitus with diabetic neuropathic arthropathy: Secondary | ICD-10-CM

## 2022-12-17 DIAGNOSIS — Z91199 Patient's noncompliance with other medical treatment and regimen due to unspecified reason: Secondary | ICD-10-CM

## 2022-12-17 NOTE — Progress Notes (Signed)
Patient was no-show for appointment today 

## 2022-12-30 ENCOUNTER — Encounter: Payer: Self-pay | Admitting: Occupational Therapy

## 2022-12-30 NOTE — Therapy (Signed)
OCCUPATIONAL THERAPY DISCHARGE SUMMARY  Visits from Start of Care: 1 (evaluation only)  Patient requested discharge due to receiving home health therapy  She was instructed to obtain a new order once she wants to resume outpatient therapy.   Patient goals were not addressed s/p evaluation only prior to this discharge .

## 2022-12-31 ENCOUNTER — Ambulatory Visit: Payer: Medicare Other | Admitting: Occupational Therapy

## 2023-01-07 ENCOUNTER — Ambulatory Visit: Payer: Medicare Other | Admitting: Occupational Therapy

## 2023-01-14 ENCOUNTER — Ambulatory Visit: Payer: Medicare Other | Admitting: Occupational Therapy

## 2023-01-21 ENCOUNTER — Ambulatory Visit: Payer: Medicare Other | Admitting: Occupational Therapy

## 2023-01-28 ENCOUNTER — Encounter: Payer: Medicare Other | Admitting: Occupational Therapy

## 2023-02-04 ENCOUNTER — Encounter: Payer: Medicare Other | Admitting: Occupational Therapy

## 2023-02-11 ENCOUNTER — Encounter: Payer: Medicare Other | Admitting: Occupational Therapy

## 2023-02-25 ENCOUNTER — Encounter: Payer: Medicare Other | Admitting: Occupational Therapy

## 2023-03-15 ENCOUNTER — Telehealth: Payer: Self-pay | Admitting: Podiatry

## 2023-03-15 NOTE — Telephone Encounter (Signed)
Received call from Dr Vonna Kotyk at [facility] health regarding mutual pt and he would like to speak to you about her. Please call him at [phone]. If no answer ok to leave a message or text him

## 2023-03-18 NOTE — Telephone Encounter (Signed)
Returned call, left VM with my cell # and email address to discuss

## 2023-11-12 ENCOUNTER — Other Ambulatory Visit: Payer: Self-pay | Admitting: Specialist

## 2023-11-12 DIAGNOSIS — M5412 Radiculopathy, cervical region: Secondary | ICD-10-CM

## 2023-12-01 ENCOUNTER — Ambulatory Visit
Admission: RE | Admit: 2023-12-01 | Discharge: 2023-12-01 | Disposition: A | Discharge status: Home / Self Care | Source: Ambulatory Visit | Attending: Specialist | Admitting: Specialist

## 2023-12-01 DIAGNOSIS — M5412 Radiculopathy, cervical region: Secondary | ICD-10-CM

## 2024-08-09 ENCOUNTER — Emergency Department (HOSPITAL_COMMUNITY)

## 2024-08-09 ENCOUNTER — Inpatient Hospital Stay (HOSPITAL_COMMUNITY)
Admission: EM | Admit: 2024-08-09 | Discharge: 2024-08-12 | Disposition: A | Discharge status: Home / Self Care | Attending: Internal Medicine | Admitting: Internal Medicine

## 2024-08-09 DIAGNOSIS — R000 Tachycardia, unspecified: Secondary | ICD-10-CM | POA: Diagnosis present

## 2024-08-09 DIAGNOSIS — Z79899 Other long term (current) drug therapy: Secondary | ICD-10-CM

## 2024-08-09 DIAGNOSIS — Z90711 Acquired absence of uterus with remaining cervical stump: Secondary | ICD-10-CM

## 2024-08-09 DIAGNOSIS — Z9841 Cataract extraction status, right eye: Secondary | ICD-10-CM

## 2024-08-09 DIAGNOSIS — Z91018 Allergy to other foods: Secondary | ICD-10-CM

## 2024-08-09 DIAGNOSIS — R112 Nausea with vomiting, unspecified: Secondary | ICD-10-CM | POA: Diagnosis not present

## 2024-08-09 DIAGNOSIS — R823 Hemoglobinuria: Secondary | ICD-10-CM | POA: Diagnosis present

## 2024-08-09 DIAGNOSIS — Q796 Ehlers-Danlos syndrome, unspecified: Secondary | ICD-10-CM | POA: Diagnosis not present

## 2024-08-09 DIAGNOSIS — G8929 Other chronic pain: Secondary | ICD-10-CM | POA: Diagnosis present

## 2024-08-09 DIAGNOSIS — G40909 Epilepsy, unspecified, not intractable, without status epilepticus: Secondary | ICD-10-CM | POA: Diagnosis present

## 2024-08-09 DIAGNOSIS — E785 Hyperlipidemia, unspecified: Secondary | ICD-10-CM | POA: Diagnosis present

## 2024-08-09 DIAGNOSIS — I160 Hypertensive urgency: Secondary | ICD-10-CM | POA: Diagnosis present

## 2024-08-09 DIAGNOSIS — Z981 Arthrodesis status: Secondary | ICD-10-CM

## 2024-08-09 DIAGNOSIS — E876 Hypokalemia: Secondary | ICD-10-CM | POA: Diagnosis present

## 2024-08-09 DIAGNOSIS — Z8673 Personal history of transient ischemic attack (TIA), and cerebral infarction without residual deficits: Secondary | ICD-10-CM

## 2024-08-09 DIAGNOSIS — D735 Infarction of spleen: Principal | ICD-10-CM | POA: Diagnosis present

## 2024-08-09 DIAGNOSIS — Z1152 Encounter for screening for COVID-19: Secondary | ICD-10-CM

## 2024-08-09 DIAGNOSIS — Z8249 Family history of ischemic heart disease and other diseases of the circulatory system: Secondary | ICD-10-CM

## 2024-08-09 DIAGNOSIS — Z91048 Other nonmedicinal substance allergy status: Secondary | ICD-10-CM

## 2024-08-09 DIAGNOSIS — H5462 Unqualified visual loss, left eye, normal vision right eye: Secondary | ICD-10-CM | POA: Diagnosis present

## 2024-08-09 DIAGNOSIS — Z87891 Personal history of nicotine dependence: Secondary | ICD-10-CM

## 2024-08-09 DIAGNOSIS — J45909 Unspecified asthma, uncomplicated: Secondary | ICD-10-CM | POA: Diagnosis present

## 2024-08-09 DIAGNOSIS — F39 Unspecified mood [affective] disorder: Secondary | ICD-10-CM | POA: Diagnosis present

## 2024-08-09 DIAGNOSIS — Z91040 Latex allergy status: Secondary | ICD-10-CM

## 2024-08-09 DIAGNOSIS — I10 Essential (primary) hypertension: Secondary | ICD-10-CM | POA: Diagnosis present

## 2024-08-09 DIAGNOSIS — E1140 Type 2 diabetes mellitus with diabetic neuropathy, unspecified: Secondary | ICD-10-CM | POA: Diagnosis present

## 2024-08-09 DIAGNOSIS — Z9842 Cataract extraction status, left eye: Secondary | ICD-10-CM

## 2024-08-09 DIAGNOSIS — Z961 Presence of intraocular lens: Secondary | ICD-10-CM | POA: Diagnosis present

## 2024-08-09 DIAGNOSIS — A419 Sepsis, unspecified organism: Secondary | ICD-10-CM | POA: Diagnosis not present

## 2024-08-09 DIAGNOSIS — R569 Unspecified convulsions: Secondary | ICD-10-CM

## 2024-08-09 DIAGNOSIS — R809 Proteinuria, unspecified: Secondary | ICD-10-CM | POA: Diagnosis present

## 2024-08-09 DIAGNOSIS — Z794 Long term (current) use of insulin: Secondary | ICD-10-CM

## 2024-08-09 DIAGNOSIS — K219 Gastro-esophageal reflux disease without esophagitis: Secondary | ICD-10-CM | POA: Diagnosis present

## 2024-08-09 DIAGNOSIS — Z7982 Long term (current) use of aspirin: Secondary | ICD-10-CM

## 2024-08-09 DIAGNOSIS — R81 Glycosuria: Secondary | ICD-10-CM | POA: Diagnosis present

## 2024-08-09 LAB — BASIC METABOLIC PANEL WITH GFR
Anion gap: 12 (ref 5–15)
BUN: 14 mg/dL (ref 6–20)
CO2: 23 mmol/L (ref 22–32)
Calcium: 9.6 mg/dL (ref 8.9–10.3)
Chloride: 100 mmol/L (ref 98–111)
Creatinine, Ser: 0.73 mg/dL (ref 0.44–1.00)
GFR, Estimated: >60 mL/min
Glucose, Bld: 132 mg/dL — ABNORMAL HIGH (ref 70–99)
Potassium: 3.4 mmol/L — ABNORMAL LOW (ref 3.5–5.1)
Sodium: 136 mmol/L (ref 135–145)

## 2024-08-09 LAB — URINALYSIS, ROUTINE W REFLEX MICROSCOPIC
Bacteria, UA: NONE SEEN
Bilirubin Urine: NEGATIVE
Glucose, UA: 50 mg/dL — AB
Ketones, ur: NEGATIVE mg/dL
Leukocytes,Ua: NEGATIVE
Nitrite: NEGATIVE
Protein, ur: >=300 mg/dL — AB
Specific Gravity, Urine: >1.046 — ABNORMAL HIGH (ref 1.005–1.030)
pH: 5 (ref 5.0–8.0)

## 2024-08-09 LAB — COMPREHENSIVE METABOLIC PANEL WITH GFR
ALT: 16 U/L (ref 0–44)
AST: 18 U/L (ref 15–41)
Albumin: 4.5 g/dL (ref 3.5–5.0)
Alkaline Phosphatase: 113 U/L (ref 38–126)
Anion gap: 16 — ABNORMAL HIGH (ref 5–15)
BUN: 18 mg/dL (ref 6–20)
CO2: 20 mmol/L — ABNORMAL LOW (ref 22–32)
Calcium: 10 mg/dL (ref 8.9–10.3)
Chloride: 100 mmol/L (ref 98–111)
Creatinine, Ser: 1 mg/dL (ref 0.44–1.00)
GFR, Estimated: >60 mL/min
Glucose, Bld: 246 mg/dL — ABNORMAL HIGH (ref 70–99)
Potassium: 3.3 mmol/L — ABNORMAL LOW (ref 3.5–5.1)
Sodium: 136 mmol/L (ref 135–145)
Total Bilirubin: 0.5 mg/dL (ref 0.0–1.2)
Total Protein: 8.5 g/dL — ABNORMAL HIGH (ref 6.5–8.1)

## 2024-08-09 LAB — CBG MONITORING, ED: Glucose-Capillary: 195 mg/dL — ABNORMAL HIGH (ref 70–99)

## 2024-08-09 LAB — CBC
HCT: 44.2 % (ref 36.0–46.0)
Hemoglobin: 14 g/dL (ref 12.0–15.0)
MCH: 24.5 pg — ABNORMAL LOW (ref 26.0–34.0)
MCHC: 31.7 g/dL (ref 30.0–36.0)
MCV: 77.4 fL — ABNORMAL LOW (ref 80.0–100.0)
Platelets: 219 K/uL (ref 150–400)
RBC: 5.71 MIL/uL — ABNORMAL HIGH (ref 3.87–5.11)
RDW: 15.2 % (ref 11.5–15.5)
WBC: 12.6 K/uL — ABNORMAL HIGH (ref 4.0–10.5)
nRBC: 0 % (ref 0.0–0.2)

## 2024-08-09 LAB — RESP PANEL BY RT-PCR (RSV, FLU A&B, COVID)  RVPGX2
Influenza A by PCR: NEGATIVE
Influenza B by PCR: NEGATIVE
Resp Syncytial Virus by PCR: NEGATIVE
SARS Coronavirus 2 by RT PCR: NEGATIVE

## 2024-08-09 LAB — LIPASE, BLOOD: Lipase: 34 U/L (ref 11–51)

## 2024-08-09 MED ORDER — ACETAMINOPHEN 650 MG RE SUPP: 650.0000 mg | Freq: Four times a day (QID) | RECTAL | Status: DC | PRN

## 2024-08-09 MED ORDER — ONDANSETRON HCL 4 MG PO TABS: 4.0000 mg | ORAL_TABLET | Freq: Four times a day (QID) | ORAL | Status: DC | PRN

## 2024-08-09 MED ORDER — LACTATED RINGERS IV SOLN: INTRAVENOUS | Status: AC

## 2024-08-09 MED ORDER — PANTOPRAZOLE SODIUM 40 MG IV SOLR
40.0000 mg | Freq: Once | INTRAVENOUS | Status: AC
  Administered 2024-08-09: 40 mg via INTRAVENOUS
  Filled 2024-08-09: qty 10

## 2024-08-09 MED ORDER — LACTATED RINGERS IV BOLUS
1000.0000 mL | Freq: Once | INTRAVENOUS | Status: AC
  Administered 2024-08-09: 1000 mL via INTRAVENOUS

## 2024-08-09 MED ORDER — SODIUM CHLORIDE 0.9 % IV SOLN
2.0000 g | Freq: Once | INTRAVENOUS | Status: AC
  Administered 2024-08-09: 2 g via INTRAVENOUS
  Filled 2024-08-09: qty 12.5

## 2024-08-09 MED ORDER — TOPIRAMATE 100 MG PO TABS
100.0000 mg | ORAL_TABLET | Freq: Once | ORAL | Status: AC
  Administered 2024-08-09: 100 mg via ORAL
  Filled 2024-08-09: qty 1

## 2024-08-09 MED ORDER — ONDANSETRON HCL 4 MG/2ML IJ SOLN
4.0000 mg | Freq: Once | INTRAMUSCULAR | Status: AC
  Administered 2024-08-09: 4 mg via INTRAVENOUS
  Filled 2024-08-09: qty 2

## 2024-08-09 MED ORDER — VANCOMYCIN HCL IN DEXTROSE 1-5 GM/200ML-% IV SOLN
1000.0000 mg | Freq: Once | INTRAVENOUS | Status: AC
  Administered 2024-08-09: 1000 mg via INTRAVENOUS
  Filled 2024-08-09: qty 200

## 2024-08-09 MED ORDER — SENNOSIDES-DOCUSATE SODIUM 8.6-50 MG PO TABS: 1.0000 | ORAL_TABLET | Freq: Every evening | ORAL | Status: DC | PRN

## 2024-08-09 MED ORDER — INSULIN ASPART PROT & ASPART (70-30 MIX) 100 UNIT/ML ~~LOC~~ SUSP
4.0000 [IU] | Freq: Once | SUBCUTANEOUS | Status: DC
  Filled 2024-08-09: qty 10

## 2024-08-09 MED ORDER — LEVETIRACETAM (KEPPRA) 500 MG/5 ML ADULT IV PUSH
4500.0000 mg | Freq: Once | INTRAVENOUS | Status: AC
  Administered 2024-08-09: 4500 mg via INTRAVENOUS
  Filled 2024-08-09: qty 45

## 2024-08-09 MED ORDER — METRONIDAZOLE 500 MG/100ML IV SOLN
500.0000 mg | Freq: Once | INTRAVENOUS | Status: AC
  Administered 2024-08-09: 500 mg via INTRAVENOUS
  Filled 2024-08-09: qty 100

## 2024-08-09 MED ORDER — ACETAMINOPHEN 325 MG PO TABS
650.0000 mg | ORAL_TABLET | Freq: Four times a day (QID) | ORAL | Status: DC | PRN
  Administered 2024-08-11: 650 mg via ORAL
  Filled 2024-08-09: qty 2

## 2024-08-09 MED ORDER — DICYCLOMINE HCL 10 MG PO CAPS: 10.0000 mg | ORAL_CAPSULE | Freq: Once | ORAL | Status: DC

## 2024-08-09 MED ORDER — INSULIN ASPART PROT & ASPART (70-30 MIX) 100 UNIT/ML ~~LOC~~ SUSP
2.0000 [IU] | Freq: Once | SUBCUTANEOUS | Status: AC
  Administered 2024-08-09: 2 [IU] via SUBCUTANEOUS
  Filled 2024-08-09: qty 10

## 2024-08-09 MED ORDER — LORAZEPAM 2 MG/ML IJ SOLN
INTRAMUSCULAR | Status: AC
  Administered 2024-08-09: 2 mg
  Filled 2024-08-09: qty 1

## 2024-08-09 MED ORDER — BISACODYL 5 MG PO TBEC: 5.0000 mg | DELAYED_RELEASE_TABLET | Freq: Every day | ORAL | Status: DC | PRN

## 2024-08-09 MED ORDER — IOHEXOL 300 MG/ML  SOLN
100.0000 mL | Freq: Once | INTRAMUSCULAR | Status: AC | PRN
  Administered 2024-08-09: 100 mL via INTRAVENOUS

## 2024-08-09 MED ORDER — ENOXAPARIN SODIUM 40 MG/0.4ML IJ SOSY
40.0000 mg | PREFILLED_SYRINGE | INTRAMUSCULAR | Status: DC
  Administered 2024-08-10 – 2024-08-11 (×2): 40 mg via SUBCUTANEOUS
  Filled 2024-08-09 (×2): qty 0.4

## 2024-08-09 MED ORDER — ACETAMINOPHEN 500 MG PO TABS
1000.0000 mg | ORAL_TABLET | Freq: Once | ORAL | Status: AC
  Administered 2024-08-09: 1000 mg via ORAL
  Filled 2024-08-09: qty 2

## 2024-08-09 MED ORDER — ONDANSETRON 4 MG PO TBDP
4.0000 mg | ORAL_TABLET | Freq: Once | ORAL | Status: AC | PRN
  Filled 2024-08-09: qty 1

## 2024-08-09 MED ORDER — ONDANSETRON HCL 4 MG/2ML IJ SOLN
4.0000 mg | Freq: Four times a day (QID) | INTRAMUSCULAR | Status: DC | PRN
  Administered 2024-08-10 – 2024-08-11 (×2): 4 mg via INTRAVENOUS
  Filled 2024-08-09 (×2): qty 2

## 2024-08-09 MED ORDER — POTASSIUM CHLORIDE 20 MEQ PO PACK
40.0000 meq | PACK | Freq: Two times a day (BID) | ORAL | Status: DC
  Administered 2024-08-09: 40 meq via ORAL
  Filled 2024-08-09 (×2): qty 2

## 2024-08-09 MED ORDER — INSULIN ASPART 100 UNIT/ML IJ SOLN
0.0000 [IU] | INTRAMUSCULAR | Status: DC
  Administered 2024-08-10 (×3): 2 [IU] via SUBCUTANEOUS
  Administered 2024-08-11: 5 [IU] via SUBCUTANEOUS
  Filled 2024-08-09 (×2): qty 2
  Filled 2024-08-09: qty 1
  Filled 2024-08-09: qty 5

## 2024-08-09 MED ORDER — LACTATED RINGERS IV BOLUS (SEPSIS)
1000.0000 mL | Freq: Once | INTRAVENOUS | Status: AC
  Administered 2024-08-09: 1000 mL via INTRAVENOUS

## 2024-08-09 MED ORDER — ONDANSETRON 4 MG PO TBDP
ORAL_TABLET | ORAL | Status: AC
  Administered 2024-08-09: 4 mg via ORAL
  Filled 2024-08-09: qty 1

## 2024-08-09 NOTE — ED Provider Notes (Signed)
 " [place] EMERGENCY DEPARTMENT AT [facility] Provider Note   CSN: [id number] Arrival date & time: 08/09/24  1447     History Chief Complaint  Patient presents with   Emesis    HPI: Sara [name] is a 48 y.o. female with history pertinent for T2DM (on Mounjaro, not currently on insulin ), elevated BMI, Ehlers-Danlos, seizure disorder, diabetic neuropathy, DDD who presents complaining of nausea, vomiting, abdominal pain. Patient arrived via POV accompanied by husband.  History provided by patient and spouse/partner.  No interpreter required during this encounter.  Patient reportedly began having symptoms days ago with congestion, rhinorrhea and sneezing.  Husband had similar symptoms, however his symptoms improved.  The patient had worsening of her symptoms but [place]man of nausea/vomiting.  Reports that she has not been able to keep down any liquids due to frequency of vomiting.  Denies any diarrhea, reports that she does have abdominal pain, however feels that it is primarily due to vomiting, pain is worsened when she is vomiting.  Denies fever, dysuria.  Endorses myalgias diffusely.  Reports that that she has a history of T2DM, previously was on insulin , however has been better controlled on Mounjaro recently, however over the past several days her blood sugar has been elevated to the 300s which is not typical for her.  Patient's recorded medical, surgical, social, medication list and allergies were reviewed in the Snapshot window as part of the initial history.   Prior to Admission medications  Medication Sig Start Date End Date Taking? Authorizing Provider  COMBIGAN  0.2-0.5 % ophthalmic solution Place 1 drop into the right eye 2 (two) times daily. 08/03/24  Yes [provider]  DULoxetine (CYMBALTA) 30 MG capsule Take 90 mg by mouth daily. 06/30/23  Yes [provider]  hydrOXYzine  (ATARAX ) 25 MG tablet Take 25 mg by mouth every 8 (eight) hours as  needed. 05/24/24  Yes [provider]  meloxicam (MOBIC) 15 MG tablet Take 1 tablet (15 mg total) by mouth daily for 14 days. 07/27/24 08/10/24 Yes [provider]  ondansetron  (ZOFRAN ) 4 MG tablet Take 1 tablet (4 mg total) by mouth daily as needed for nausea. 08/09/24 08/09/25 Yes [provider]  pantoprazole  (PROTONIX ) 40 MG tablet Take 40 mg by mouth daily. 03/06/24 03/06/25 Yes [provider]  acetaminophen  (TYLENOL ) 500 MG tablet Take 500 mg by mouth every 6 (six) hours as needed for moderate pain.    [provider]  albuterol  (PROVENTIL  HFA;VENTOLIN  HFA) 108 (90 Base) MCG/ACT inhaler Inhale 2 to 3 puffs into the lungs every 6 hours as needed (respiratory problems)    [provider]  aspirin  81 MG chewable tablet Chew 81 mg by mouth daily.    [provider]  atorvastatin  (LIPITOR ) 80 MG tablet Take 80 mg by mouth daily.    [provider]  betamethasone  dipropionate 0.05 % cream APPLY TOPICALLY TWICE DAILY Patient taking differently: Apply 1 Application topically 2 (two) times daily. 11/09/22   McDonald, Juliene SAUNDERS, DPM  bisacodyl  (DULCOLAX) 10 MG suppository Place 10 mg rectally daily as needed for moderate constipation.    [provider]  blood glucose meter kit and supplies by Other route once for 1 dose. Use as instructed 04/21/22   [provider]  Calcium  Carbonate-Simethicone (MAALOX MAX) 1000-60 MG CHEW Chew 2 tablets by mouth daily as needed (indigestion). 03/17/22   [provider]  cetirizine (ZYRTEC) 10 MG tablet Take 10 mg by mouth daily.    [provider]  Continuous Glucose Receiver (DEXCOM G7 RECEIVER) DEVI as directed.    [provider]  Continuous Glucose Sensor (DEXCOM G7 SENSOR) MISC SMARTSIG:Once a Week    [provider]  cyclobenzaprine  (FLEXERIL ) 10 MG tablet Take 1 tablet (10 mg total) by mouth 3 (three) times daily as needed for muscle  spasms. Patient taking differently: Take 10 mg by mouth at bedtime. 03/29/22   Vicky Charleston, PA-C  famotidine  (PEPCID ) 40 MG tablet Take 40 mg by mouth 2 (two) times daily. 05/07/22   [provider]  ferrous sulfate 325 (65 FE) MG tablet Take 325 mg by mouth 3 (three) times a week.    [provider]  gabapentin  (NEURONTIN ) 300 MG capsule Take 900 mg by mouth 3 (three) times daily.    [provider]  hydrochlorothiazide (HYDRODIURIL) 25 MG tablet Take 25 mg by mouth daily.    [provider]  insulin  aspart (NOVOLOG ) 100 UNIT/ML injection For insulin  pump - administration average 160u over course of day Patient not taking: Reported on 12/16/2022    [provider]  insulin  glargine (LANTUS ) 100 UNIT/ML injection Inject 15 Units into the skin at bedtime.    [provider]  insulin  lispro (HUMALOG) 100 UNIT/ML injection Inject 10 Units into the skin 3 (three) times daily before meals. Sliding scale    [provider]  lamoTRIgine  (LAMICTAL ) 100 MG tablet Take 1 tablet (100 mg total) by mouth 2 (two) times daily. Patient not taking: Reported on 12/16/2022 03/13/18 07/09/20  Pyreddy, Pavan, MD  levETIRAcetam  (KEPPRA ) 750 MG tablet Take 1,500 mg by mouth 2 (two) times daily.    [provider]  loperamide (IMODIUM) 2 MG capsule Take 2 mg by mouth 4 (four) times daily as needed for diarrhea or loose stools. 03/18/22   [provider]  mometasone (NASONEX) 50 MCG/ACT nasal spray SMARTSIG:2 Spray(s) Both Nares Daily    [provider]  MOUNJARO 15 MG/0.5ML Pen SMARTSIG:15 Milligram(s) SUB-Q Once a Week    [provider]  nicotine (NICOTROL) 10 MG inhaler Inhale 1 continuous puffing into the lungs every 2 (two) hours as needed for smoking cessation. Patient not taking: Reported on 12/16/2022    [provider]  omeprazole (PRILOSEC) 20 MG capsule Take 20 mg by mouth 2 (two) times daily before a meal.     [provider]  oxyCODONE -acetaminophen  (PERCOCET) 7.5-325 MG tablet Take 1 tablet by mouth every four (4) hours as needed for pain. 08/30/24 08/30/25  [provider]  polyethylene glycol (MIRALAX  / GLYCOLAX ) 17 g packet Take 17 g by mouth daily.    [provider]  pregabalin (LYRICA) 100 MG capsule Take by mouth. Patient not taking: Reported on 12/16/2022 08/19/21 08/19/22  [provider]  pregabalin (LYRICA) 100 MG capsule Take 100 mg by mouth 3 (three) times daily. Patient not taking: Reported on 12/16/2022    [provider]  prochlorperazine  (COMPAZINE ) 5 MG tablet Take 5 mg by mouth every 6 (six) hours as needed for nausea or vomiting. Patient not taking: Reported on 12/16/2022 03/18/22   [provider]  Semaglutide, 1 MG/DOSE, (OZEMPIC, 1 MG/DOSE,) 4 MG/3ML SOPN Inject 1 mg into the skin once a week. Patient not taking: Reported on 12/16/2022    [provider]  SENEXON-S 8.6-50 MG tablet Take 2 tablets by mouth daily. Patient not taking: Reported on 12/16/2022 03/17/22   [provider]  tacrolimus (PROTOPIC) 0.1 % ointment Apply 1 Application  topically 2 (two) times daily.    [provider]  Thiamine HCl (VITAMIN B-1) 250 MG tablet Take 250 mg by mouth daily.    [provider]  topiramate  (TOPAMAX ) 100 MG tablet Take 1 tablet (100 mg total) by mouth 4 (four) times daily. Patient taking differently: Take 100 mg by mouth 2 (two) times daily.  03/13/18 07/09/20  Pyreddy, Pavan, MD     Allergies: Other, Coconut (cocos nucifera), Coconut fatty acid, Latex, and Tape   Review of Systems   ROS as per HPI  Physical Exam Updated Vital Signs BP (!) 154/99   Pulse (!) 118   Temp 98.1 F (36.7 C) (Oral)   Resp 17   SpO2 98%  Physical Exam Vitals and nursing note reviewed.  Constitutional:      General: She is not in acute distress.    Appearance: She is well-developed.  HENT:     Head: Normocephalic and  atraumatic.  Eyes:     Conjunctiva/sclera: Conjunctivae normal.  Cardiovascular:     Rate and Rhythm: Regular rhythm. Tachycardia present.     Heart sounds: No murmur heard. Pulmonary:     Effort: Pulmonary effort is normal. No respiratory distress.     Breath sounds: Normal breath sounds.  Abdominal:     Palpations: Abdomen is soft.     Tenderness: There is abdominal tenderness (Fuhs abdominal tenderness, more prominent in the bilateral lower quadrants).  Musculoskeletal:        General: No swelling.     Cervical back: Neck supple.  Skin:    General: Skin is warm and dry.     Capillary Refill: Capillary refill takes less than 2 seconds.  Neurological:     Mental Status: She is alert.  Psychiatric:        Mood and Affect: Mood normal.     ED Course/ Medical Decision Making/ A&P    Procedures Procedures   Medications Ordered in ED Medications  potassium chloride  (KLOR-CON ) packet 40 mEq (40 mEq Oral Given 08/09/24 2207)  lactated ringers  infusion ( Intravenous New Bag/Given 08/09/24 2203)  vancomycin  (VANCOCIN ) IVPB 1000 mg/200 mL premix (1,000 mg Intravenous New Bag/Given 08/09/24 2304)  acetaminophen  (TYLENOL ) tablet 650 mg (has no administration in time range)    Or  acetaminophen  (TYLENOL ) suppository 650 mg (has no administration in time range)  senna-docusate (Senokot-S) tablet 1 tablet (has no administration in time range)  bisacodyl  (DULCOLAX) EC tablet 5 mg (has no administration in time range)  ondansetron  (ZOFRAN ) tablet 4 mg (has no administration in time range)    Or  ondansetron  (ZOFRAN ) injection 4 mg (has no administration in time range)  enoxaparin  (LOVENOX ) injection 40 mg (has no administration in time range)  insulin  aspart (novoLOG ) injection 0-9 Units (has no administration in time range)  ondansetron  (ZOFRAN -ODT) disintegrating tablet 4 mg (4 mg Oral Given 08/09/24 1540)  lactated ringers  bolus 1,000 mL (0 mLs Intravenous Stopped 08/09/24 2132)   acetaminophen  (TYLENOL ) tablet 1,000 mg (1,000 mg Oral Given 08/09/24 1725)  lactated ringers  bolus 1,000 mL (0 mLs Intravenous Stopped 08/09/24 2133)  ondansetron  (ZOFRAN ) injection 4 mg (4 mg Intravenous Given 08/09/24 1729)  iohexol  (OMNIPAQUE ) 300 MG/ML solution 100 mL (100 mLs Intravenous Contrast Given 08/09/24 1713)  insulin  aspart protamine- aspart (NOVOLOG  MIX 70/30) injection 2 Units (2 Units Subcutaneous Given 08/09/24 1816)  lactated ringers  bolus 1,000 mL (1,000 mLs Intravenous New Bag/Given 08/09/24 2209)  ceFEPIme  (MAXIPIME ) 2 g in sodium chloride  0.9 % 100 mL  IVPB (0 g Intravenous Stopped 08/09/24 2236)  metroNIDAZOLE  (FLAGYL ) IVPB 500 mg (0 mg Intravenous Stopped 08/09/24 2301)  pantoprazole  (PROTONIX ) injection 40 mg (40 mg Intravenous Given 08/09/24 2321)  LORazepam  (ATIVAN ) 2 MG/ML injection (2 mg  Given 08/09/24 2301)  topiramate  (TOPAMAX ) tablet 100 mg (100 mg Oral Given 08/09/24 2321)  levETIRAcetam  (KEPPRA ) undiluted injection 4,500 mg (4,500 mg Intravenous Given 08/09/24 2321)    Medical Decision Making:   Sara [name] is a 48 y.o. female who presents for multiple complaints as per above.  Physical exam is pertinent for tachycardia, diffuse abdominal tenderness.   The differential includes but is not limited to dehydration, AKI, electrolyte derangement, viral illness, influenza, bowel obstruction.  Independent historian: Spouse/partner  External data reviewed: Labs: reviewed prior labs for baseline  Initial Plan:  Screening labs including CBC and Metabolic panel to evaluate for infectious or metabolic etiology of disease.  Lipase given abdominal pain, vomiting COVID/flu/RSV given multiple symptoms concerning for possible viral illness Urinalysis with reflex culture ordered to evaluate for UTI or relevant urologic/nephrologic pathology.  CT abdomen pelvis to evaluate for structural/infectious intra-abdominal pathology.  EKG to evaluate for cardiac  pathology Objective evaluation as below reviewed   Labs: Ordered, Independent interpretation, and Details: UA without UTI.  CBC with slight leukocytosis to 12.6.  No anemia or thrombocytopenia.  CMP with hypokalemia to 3.3.  Hyperglycemia 2 246 with mild anion gap elevation.  No emergent LFT abnormalities.  Lipase WNL.  COVID/flu/RSV negative.  Repeat BMP with improvement in glucose, normalization of bicarb and anion gap.  Radiology: Ordered, Independent interpretation, Details: Personally viewed CT of the abdomen and pelvis, do not appreciate free air, significant free fluid, obstructive bowel gas pattern, focal fat stranding or fluid collection, and All images reviewed independently.  Agree with radiology report at this time.   CT ABDOMEN PELVIS W CONTRAST Result Date: 08/09/2024 CLINICAL DATA:  Abdominal pain. EXAM: CT ABDOMEN AND PELVIS WITH CONTRAST TECHNIQUE: Multidetector CT imaging of the abdomen and pelvis was performed using the standard protocol following bolus administration of intravenous contrast. RADIATION DOSE REDUCTION: This exam was performed according to the departmental dose-optimization program which includes automated exposure control, adjustment of the mA and/or kV according to patient size and/or use of iterative reconstruction technique. CONTRAST:  OMNIPAQUE  IOHEXOL  300 MG/ML  SOLN COMPARISON:  CT abdomen pelvis dated 11/25/2022. FINDINGS: Lower chest: The visualized lung bases are clear. No intra-abdominal free air or free fluid. Hepatobiliary: The liver is unremarkable no biliary dilatation. No calcified gallstone or pericholecystic fluid. Pancreas: Unremarkable. No pancreatic ductal dilatation or surrounding inflammatory changes. Spleen: There is a area of decreased enhancement in the superior pole of the spleen suspicious for splenic infarct. Splenic laceration is not excluded if there is clinical history of trauma. There is mild stranding of the adjacent fat plane which  may represent edema or small hemorrhage. No large fluid collection or hematoma. Adrenals/Urinary Tract: The adrenal glands are unremarkable. There is no hydronephrosis on either side. The visualized ureters and urinary bladder probable Stomach/Bowel: There is no bowel obstruction or active inflammation. The appendix is normal. Vascular/Lymphatic: Mild atherosclerotic calcification of the abdominal aorta. The IVC is unremarkable. No portal venous gas. There is no adenopathy. Reproductive: Hysterectomy. No suspicious adnexal masses. Bilateral ovarian cysts measure up to 2.7 cm on the right. No imaging follow-up. Other: None Musculoskeletal: Moderate bilateral hip arthritic changes. No acute osseous pathology. T9 butterfly vertebra. IMPRESSION: 1. Splenic infarct versus less likely laceration. Correlation with clinical history  of trauma recommended. 2. No bowel obstruction. Normal appendix. 3.  Aortic Atherosclerosis (ICD10-I70.0). Electronically Signed   By: Vanetta Chou M.D.   On: 08/09/2024 17:39    EKG/Medicine tests: Ordered and Independent interpretation EKG Interpretation:                  Interventions: LR bolus, insulin , Zofran , potassium, Tylenol   See the EMR for full details regarding lab and imaging results.  Patient presents to the emergency department for multiple symptoms, p.o. intolerance, as well as tachycardia.  Patient warrants broad labs and imaging as per initial plan above.  Patient did initially have hyperglycemia with borderline anion gap elevation, considered DKA, however also possible that patient has some mild dehydration contributing to these derangements given that she has had p.o. intolerance, thus patient was given subcutaneous insulin , fluid bolus, and BMP was reassessed with normalization of anion gap as well as bicarb, therefore doubt DKA.  Lipase WNL, doubt pancreatitis contributing to presentation, CT does not reveal acute intra-abdominal pathology.  Patient is negative  for COVID, flu, RSV.  No evidence of UTI.  Patient does have slight leukocytosis which is consistent with her infectious symptoms.  Despite receiving 2 L of fluids, and remaining afebrile, patient continues to be tachycardic, and continues to feel significantly unwell, thus do not feel that patient is appropriate for discharge, feels that she warrants admission.  Discussed with patient and husband at bedside who are amenable to this plan.  Given persistent tachycardia, considered possible underlying sepsis, therefore blood cultures obtained, and administered broad-spectrum antibiotics.  Hospitalist consulted for admission, discussed with Dr. Lou, who accepted the patient to his service.  After admission, patient did have a generalized tonic-clonic seizure while in the emergency department, I presented immediately to the bedside.  Patient does have a known history of seizure disorder, husband at bedside confirms that current seizure activity is consistent with prior seizures.  Patient has not been able to tolerate her home Topamax  over the past 2 days due to her p.o. intolerance.  Seizure activity lasted approximately 90 seconds, was given IV Ativan , as well as Keppra  load, and home Topamax  was ordered as well.  I did update the hospitalist of these developments.  Patient's mental status did improve, do feel that patient continues to be appropriate for admission to the floor.  Presentation is most consistent with acute complicated illness and Current presentation is complicated by underlying chronic conditions  Discussion of management or test interpretations with external provider(s): Dr. Lou, hospitalists  Risk Drugs:OTC drugs, Prescription drug management, and Parenteral controlled substances Treatment: Decision regarding hospitalization  Disposition: ADMIT: I believe the patient requires admission for further care and management. The patient was admitted to hospitalists. Please see  inpatient provider note for additional treatment plan details.   MDM generated using voice dictation software and may contain dictation errors.  Please contact me for any clarification or with any questions.  Clinical Impression:  1. Sepsis without acute organ dysfunction, due to unspecified organism (HCC)   2. Tachycardia   3. Seizure (HCC)      Admit   Final Clinical Impression(s) / ED Diagnoses Final diagnoses:  Sepsis without acute organ dysfunction, due to unspecified organism (HCC)  Tachycardia  Seizure [facility])    Rx / DC Orders ED Discharge Orders     None        Rogelia Jerilynn RAMAN, MD 08/10/24 0001  "

## 2024-08-09 NOTE — ED Triage Notes (Signed)
 Vomiting, body aches, chills x yesterday

## 2024-08-09 NOTE — Progress Notes (Signed)
 " Established Patient Video Visit Note  This visit is conducted via programmer, applications.  Patient is currently located in the state of [place] . I have identified myself to the patient and conveyed my credentials to Ms. Sara [name] I have explained the capabilities and limitations of telemedicine and the patient and myself both agree that it is appropriate for their current circumstances/symptoms. In case we get disconnected, patient's phone number is [phone] (home)   Patient has signed informed consent on file in medical record. Is there someone else in the room? No.   Ms. Sara [name] is a 48 y.o. female that presents for a video visit today regarding the following issues: Assessment & Plan Flu-Like Symptoms Acute onset of vomiting and fever with chills, weakness, and inability to retain fluids. Worsening muscles aches. Differential includes influenza, COVID-19, viral URI. 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 Unable to drive, family members all working, recommending once off work that family takes the patient in promptly. Lives in [place], recommended that any hospital or UC closer would be good. Needing testing to confirm diagnosis and start tamiflu vs. paxlovid - Recommended evaluation at urgent care or emergency department. - Prescribed zofran  to help with nausea before evaluation   Chronic low back pain with bilateral sciatica and muscle spasms On oxycodone , helping but not sufficient. Muscle spasms may be related to viral syndrome. Planning to get back injections after doing qualifying amount of PT, pian making PT intolerable. Increasing regimen to oxycodone  7.5mg  at next fill. -.  Percocet 7.5-325mg  daily, 30 DS - Recommended evaluation at emergency department for further assessment.    Total time spent with patient: 15 min Visit Format/Coding: Video Call: (Audio and Video): All insurers/Charity care/self-pay:  use traditional E/M codes for New [employer] Est.  No chief complaint on file.   HPI:   Ms. Sara [name] is a 48 y.o. female that presents for a video appointment regarding the following issues:  History of Present Illness Sara [name] is a 48 year old female who presents with back pain and symptoms suggestive of a viral illness.  She has been experiencing back pain that has not improved despite taking oxycodone . The pain is accompanied by severe muscle spasms described as 'unbearable' and 'worse than they normally be'.  She began experiencing symptoms yesterday morning, including vomiting, inability to keep any food or liquids down, and alternating sensations of being hot and cold. She has not measured her temperature but reports feeling 'on fire' and then cold. No cough is present, but she feels weak and clammy, stating she can 'barely hold my head up'. She has been taking Tylenol  for her symptoms, and her nose has started running today.  Vomiting, weakness, and muscle spasms are present. No cough.   I have reviewed the patients problem list, current medications, allergies, and social history and updated them as needed.  Ms. Sara [name]  reports that she has quit smoking. Her smoking use included cigars and cigarettes. She started smoking about 22 months ago. She has never used smokeless tobacco.    OBJECTIVE: General: Ill- appearing, moderate distress Psych: Appropriate affect, stressed mood  [facility] of   at [facility] [address], [place], [place] [address]  Telephone [phone]  Fax [phone] [email or web address]   Will Claudene MD,  (he/him) Resident Physician, PGY- 2 Department of Family Medicine    "

## 2024-08-09 NOTE — Sepsis Progress Note (Signed)
 Elink monitoring for the code sepsis protocol.

## 2024-08-09 NOTE — H&P (Signed)
 " History and Physical  Sara [name] FMW:[id number] DOB: 04/03/1976 DOA: 08/09/2024  PCP: [name], MD   Chief Complaint: Nausea, vomiting, body aches and chills  HPI: Sara [name] is a 48 y.o. female with medical history significant for T2DM, HLD, asthma, HTN, CVA, Ehlers-Danlos, seizure disorder, diabetic neuropathy, DDD, chronic back pain, migraine headache, anemia, osteoarthritis and GERD who presented to the ED for evaluation of nausea, vomiting and muscle aches. Patient reports she was at her usual state of health until yesterday morning when she woke up with intractable nausea and vomiting. She has not been able to keep anything down and has had associated muscle aches, leg spasms, fatigue, cold chills, hot flashes and abdominal discomfort with a constant vomiting. She called her PCP and was advised to present to the ED for further evaluation so she does not get too dehydrated. She endorsed mild headache and rhinorrhea at the moment but denies any shortness of breath, cough, chest pain, dizziness, abdominal pain, dysuria, recent trauma, falls, fevers or chills  ED Course: Initial vitals show temp 98.5, RR 19-27, HR 110-120s, SBP 150-190s, SpO2 100% on room air. Initial labs significant for K+ 3.4, WBC 12.5, glucose 246, negative flu, RSV and COVID test, UA shows significant proteinuria, small hemoglobinuria and mild glucosuria but no signs of infection. EKG shows sinus tach with LVH.  CT A/P shows splenic infarct versus less likely laceration. Pt received Tylenol , 2 units of NovoLog  70/30, IV Zofran  and potassium supplementation. Sepsis protocol was activated and patient received IV LR boluses, IV vancomycin , IV Flagyl  and IV cefepime . [facility] was consulted for admission.   Review of Systems: Please see HPI for pertinent positives and negatives. A complete 10 system review of systems are otherwise negative.  Past Medical History:  Diagnosis Date   Adopted     Arthritis    hips, lower back   Asthma    Blind left eye    Complication of anesthesia    Was able to feel procedure during neck surgery   Diabetes mellitus without complication (HCC)    Ehlers-Danlos disease    Epilepsy (HCC)    Hypertension    Migraine headache    approx 6x yr (now getting injections from neurology)   Seizures (HCC)    most recent 05/2020   Stroke (HCC) 10/2020   Past Surgical History:  Procedure Laterality Date   CARPAL TUNNEL RELEASE     CATARACT EXTRACTION W/PHACO Right 06/18/2020   Procedure: CATARACT EXTRACTION PHACO AND INTRAOCULAR LENS PLACEMENT (IOC) RIGHT DIABETIC 13.23 01:42.7;  Surgeon: Jaye Elsie, MD;  Location: [facility];  Service: Ophthalmology;  Laterality: Right;  Diabetic - insulin    CATARACT EXTRACTION W/PHACO Left 07/09/2020   Procedure: CATARACT EXTRACTION PHACO AND INTRAOCULAR LENS PLACEMENT (IOC) LEFT DIABETIC 9.99 01:19.3 ;  Surgeon: Jaye Elsie, MD;  Location: [facility];  Service: Ophthalmology;  Laterality: Left;  Diabetic - insulin    CERVICAL FUSION     CESAREAN SECTION  x3   HIP ARTHROSCOPY     PARTIAL HYSTERECTOMY     Social History:  reports that she quit smoking about 22 months ago. Her smoking use included cigarettes. She smoked an average of 0.3 packs per day. She has never used smokeless tobacco. She reports current alcohol use of about 1.0 standard drink of alcohol per week. She reports that she does not use drugs.  Allergies[1]  Family History  Problem Relation Age of Onset   Hypertension Other      Prior  to Admission medications  Medication Sig Start Date End Date Taking? Authorizing Provider  acetaminophen  (TYLENOL ) 500 MG tablet Take 500 mg by mouth every 6 (six) hours as needed for moderate pain.    [provider]  albuterol  (PROVENTIL  HFA;VENTOLIN  HFA) 108 (90 Base) MCG/ACT inhaler Inhale 2 to 3 puffs into the lungs every 6 hours as needed (respiratory problems)    [provider]  aspirin  81 MG chewable tablet Chew 81 mg by mouth daily.    [provider]  atorvastatin  (LIPITOR ) 80 MG tablet Take 80 mg by mouth daily.    [provider]  betamethasone  dipropionate 0.05 % cream APPLY TOPICALLY TWICE DAILY Patient taking differently: Apply 1 Application topically 2 (two) times daily. 11/09/22   McDonald, Juliene SAUNDERS, DPM  bisacodyl  (DULCOLAX) 10 MG suppository Place 10 mg rectally daily as needed for moderate constipation.    [provider]  blood glucose meter kit and supplies by Other route once for 1 dose. Use as instructed 04/21/22   [provider]  Calcium  Carbonate-Simethicone (MAALOX MAX) 1000-60 MG CHEW Chew 2 tablets by mouth daily as needed (indigestion). 03/17/22   [provider]  cetirizine (ZYRTEC) 10 MG tablet Take 10 mg by mouth daily.    [provider]  cyclobenzaprine  (FLEXERIL ) 10 MG tablet Take 1 tablet (10 mg total) by mouth 3 (three) times daily as needed for muscle spasms. Patient taking differently: Take 10 mg by mouth at bedtime. 03/29/22   Vicky Charleston, PA-C  famotidine  (PEPCID ) 40 MG tablet Take 40 mg by mouth 2 (two) times daily. 05/07/22   [provider]  gabapentin  (NEURONTIN ) 300 MG capsule Take 900 mg by mouth 3 (three) times daily.    [provider]  insulin  aspart (NOVOLOG ) 100 UNIT/ML injection For insulin  pump - administration average 160u over course of day Patient not taking: Reported on 12/16/2022    [provider]  insulin  glargine (LANTUS ) 100 UNIT/ML injection Inject 15 Units into the skin at bedtime.    [provider]  insulin  lispro (HUMALOG) 100 UNIT/ML injection Inject 10 Units into the skin 3 (three) times daily before meals. Sliding scale    [provider]  lamoTRIgine  (LAMICTAL ) 100 MG tablet Take 1 tablet (100 mg total) by mouth 2 (two) times daily. Patient not taking: Reported on 12/16/2022 03/13/18 07/09/20  Pyreddy,  Pavan, MD  levETIRAcetam  (KEPPRA ) 750 MG tablet Take 1,500 mg by mouth 2 (two) times daily.    [provider]  loperamide (IMODIUM) 2 MG capsule Take 2 mg by mouth 4 (four) times daily as needed for diarrhea or loose stools. 03/18/22   [provider]  nicotine (NICOTROL) 10 MG inhaler Inhale 1 continuous puffing into the lungs every 2 (two) hours as needed for smoking cessation. Patient not taking: Reported on 12/16/2022    [provider]  omeprazole (PRILOSEC) 20 MG capsule Take 20 mg by mouth 2 (two) times daily before a meal.    [provider]  polyethylene glycol (MIRALAX  / GLYCOLAX ) 17 g packet Take 17 g by mouth daily.    [provider]  pregabalin (LYRICA) 100 MG capsule Take by mouth. Patient not taking: Reported on 12/16/2022 08/19/21 08/19/22  [provider]  pregabalin (LYRICA) 100 MG capsule Take 100 mg by mouth 3 (three) times daily. Patient not taking: Reported on 12/16/2022    [provider]  prochlorperazine  (COMPAZINE ) 5 MG tablet Take 5 mg by mouth every 6 (  six) hours as needed for nausea or vomiting. Patient not taking: Reported on 12/16/2022 03/18/22   [provider]  Semaglutide, 1 MG/DOSE, (OZEMPIC, 1 MG/DOSE,) 4 MG/3ML SOPN Inject 1 mg into the skin once a week. Patient not taking: Reported on 12/16/2022    [provider]  SENEXON-S 8.6-50 MG tablet Take 2 tablets by mouth daily. Patient not taking: Reported on 12/16/2022 03/17/22   [provider]  tacrolimus (PROTOPIC) 0.1 % ointment Apply 1 Application topically 2 (two) times daily.    [provider]  Thiamine HCl (VITAMIN B-1) 250 MG tablet Take 250 mg by mouth daily.    [provider]  topiramate  (TOPAMAX ) 100 MG tablet Take 1 tablet (100 mg total) by mouth 4 (four) times daily. Patient taking differently: Take 100 mg by mouth 2 (two) times daily.  03/13/18 07/09/20  Lonzo Pollen, MD    Physical Exam: BP (!) 156/100  (BP Location: Right Arm)   Pulse (!) 126   Temp 98.9 F (37.2 C) (Oral)   Resp 18   Ht 5' 2 (1.575 m)   Wt 67.5 kg   SpO2 100%   BMI 27.22 kg/m  General: Pleasant, acutely ill middle-age woman laying in bed. No acute distress. HEENT: [place]/AT. Anicteric sclera CV: Tachycardic. Regular rhythm. No murmurs, rubs, or gallops. No LE edema Pulmonary: Lungs CTAB. Normal effort. No wheezing or rales. Abdominal: Soft, NT/ND. Specifically no ttp of the LUQ. Normal bowel sounds. Extremities: Palpable radial and DP pulses. Normal ROM. Skin: Warm and dry. No obvious rash or lesions. Neuro: A&Ox3. Generalized weakness. Moves all extremities. Normal sensation to light touch. No focal deficit. Psych: Normal mood and affect          Labs on Admission:  Basic Metabolic Panel: Recent Labs  Lab 08/09/24 1540 08/09/24 2046  NA 136 136  K 3.3* 3.4*  CL 100 100  CO2 20* 23  GLUCOSE 246* 132*  BUN 18 14  CREATININE 1.00 0.73  CALCIUM  10.0 9.6  MG  --  2.0  PHOS  --  3.0   Liver Function Tests: Recent Labs  Lab 08/09/24 1540  AST 18  ALT 16  ALKPHOS 113  BILITOT 0.5  PROT 8.5*  ALBUMIN 4.5   Recent Labs  Lab 08/09/24 1540  LIPASE 34   No results for input(s): AMMONIA in the last 168 hours. CBC: Recent Labs  Lab 08/09/24 1540  WBC 12.6*  HGB 14.0  HCT 44.2  MCV 77.4*  PLT 219   Cardiac Enzymes: No results for input(s): CKTOTAL, CKMB, CKMBINDEX, TROPONINI in the last 168 hours. BNP (last 3 results) No results for input(s): BNP in the last 8760 hours.  ProBNP (last 3 results) No results for input(s): PROBNP in the last 8760 hours.  CBG: Recent Labs  Lab 08/09/24 1734  GLUCAP 195*    Radiological Exams on Admission: CT ABDOMEN PELVIS W CONTRAST Result Date: 08/09/2024 CLINICAL DATA:  Abdominal pain. EXAM: CT ABDOMEN AND PELVIS WITH CONTRAST TECHNIQUE: Multidetector CT imaging of the abdomen and pelvis was performed using the standard protocol  following bolus administration of intravenous contrast. RADIATION DOSE REDUCTION: This exam was performed according to the departmental dose-optimization program which includes automated exposure control, adjustment of the mA and/or kV according to patient size and/or use of iterative reconstruction technique. CONTRAST:  OMNIPAQUE  IOHEXOL  300 MG/ML  SOLN COMPARISON:  CT abdomen pelvis dated 11/25/2022. FINDINGS: Lower chest: The visualized lung bases are clear. No intra-abdominal free air  or free fluid. Hepatobiliary: The liver is unremarkable no biliary dilatation. No calcified gallstone or pericholecystic fluid. Pancreas: Unremarkable. No pancreatic ductal dilatation or surrounding inflammatory changes. Spleen: There is a area of decreased enhancement in the superior pole of the spleen suspicious for splenic infarct. Splenic laceration is not excluded if there is clinical history of trauma. There is mild stranding of the adjacent fat plane which may represent edema or small hemorrhage. No large fluid collection or hematoma. Adrenals/Urinary Tract: The adrenal glands are unremarkable. There is no hydronephrosis on either side. The visualized ureters and urinary bladder probable Stomach/Bowel: There is no bowel obstruction or active inflammation. The appendix is normal. Vascular/Lymphatic: Mild atherosclerotic calcification of the abdominal aorta. The IVC is unremarkable. No portal venous gas. There is no adenopathy. Reproductive: Hysterectomy. No suspicious adnexal masses. Bilateral ovarian cysts measure up to 2.7 cm on the right. No imaging follow-up. Other: None Musculoskeletal: Moderate bilateral hip arthritic changes. No acute osseous pathology. T9 butterfly vertebra. IMPRESSION: 1. Splenic infarct versus less likely laceration. Correlation with clinical history of trauma recommended. 2. No bowel obstruction. Normal appendix. 3.  Aortic Atherosclerosis (ICD10-I70.0). Electronically Signed   By: Vanetta Chou M.D.   On: 08/09/2024 17:39   Assessment/Plan Azlin Vest is a 48 y.o. female with medical history significant for T2DM, HLD, asthma, HTN, CVA, Ehlers-Danlos, seizure disorder, diabetic neuropathy, DDD, chronic back pain, migraine headache, anemia, osteoarthritis and GERD who presented to the ED for evaluation of nausea, vomiting and muscle aches.   # Sepsis of unknown source - Presented with 2 days of nausea, vomiting, muscle aches, cold chills and subjective fevers - CTA A/P with no evidence of intra-abdominal infection, patient with no respiratory symptoms, UA unremarkable and negative flu/COVID/RSV - At the moment, no source of infection has been identified - Patient remains afebrile but has mild leukocytosis, and persistent tachycardia - Continue empirical antibiotics with IV vancomycin , cefepime  and Flagyl  - Continue IV LR 150 cc/h - Follow-up blood culture and full RVP - Trend CBC, fever curve  # Intractable nausea and vomiting - Reports persistent nausea and vomiting since yesterday -This is likely secondary to splenic infarct identified on CT -IV hydration as above and as needed Zofran   # Splenic infarct - CT A/P showed an area of decreased enhancement in the superior pole of the spleen suspicious for splenic infarct - Patient with low tenderness to palpation of the LUQ, no bruises no signs of superficial infection - IV antibiotics, IVF hydration and IV Dilaudid  as needed for pain  # Seizure - After admission, patient with a witnessed seizure lasting about 90 seconds. - Per spouse, this was patient's typical seizure with her last seizure episode 3 months ago.  - Recurrent seizure likely secondary to patient's inability to take any of her seizure medications over the last 2 days - S/p IV Ativan  2 mg x 1, home Topamax  and IV Keppra  load - Continue home Keppra  and Topamax  - Seizure precautions  # Ehlers-Danlos syndrome # Tachycardia - Patient with  persistent tachycardia and after 3 L of IV fluids, EKG shows sinus tach with RAE and LVH - Tachycardia could be secondary to splenic infarct however due to patient's history of Ehlers-Danlos, she is at risk of valvulopathies - No previous echocardiogram on file, check echocardiogram - If no improvement in tachycardia in the morning, can consider CTA chest to rule out PE  # T2DM, well-controlled - A1c of 5.6% 2 months ago - Q4H SSI while NPO with CBG  monitoring  # Hypertensive urgency - BP elevated with SBP in the 150s-190s, likely secondary to inability to take BP meds over the last 2 days - IV hydralazine  as needed for SBP > 180  Chronic medical problems # HTN # HLD # Hx of CVA # GERD # Mood disorder - Resume meds after completion of full med rec  DVT prophylaxis: Lovenox      Code Status: Full Code  Consults called: None  Family Communication: Discussed results/findings and plan for admission with spouse at bedside  Severity of Illness: The appropriate patient status for this patient is OBSERVATION. Observation status is judged to be reasonable and necessary in order to provide the required intensity of service to ensure the patient's safety. The patient's presenting symptoms, physical exam findings, and initial radiographic and laboratory data in the context of their medical condition is felt to place them at decreased risk for further clinical deterioration. Furthermore, it is anticipated that the patient will be medically stable for discharge from the hospital within 2 midnights of admission.   Level of care: Telemetry   I personally spent a total of 75 minutes in the care of the patient today including preparing to see the patient, getting/reviewing separately obtained history, performing a medically appropriate exam/evaluation, placing orders, documenting clinical information in the EHR, and communicating results.   Lou Claretta HERO, MD 08/10/2024, 12:55 AM [facility] Pager: [phone] Isaiah 41:10   If 7PM-7AM, please contact night-coverage [email or web address] Password [email or web address]     [1]  Allergies Allergen Reactions   Other Hives, Itching and Rash    Coban, patient states that skin turn red and has serve itching  Coban, patient states that skin turn red and has serve itching  Coban, patient states that skin turn red and has serve itching  Coban, patient states that skin turn red and has serve itching     Coconut (Cocos Nucifera) Itching and Swelling    Coconut,  Facial swelling   Coconut Fatty Acid Itching   Latex Itching and Rash    (Elastic in underwear)   Tape Rash   "

## 2024-08-09 NOTE — Progress Notes (Signed)
Immediately after or during the visit, I reviewed with the resident the medical history and the resident???s findings on physical examination.?? I discussed with the resident the patient???s diagnosis and concur with the treatment plan as documented in the resident note. Tiandre Teall Vikki Ports, MD

## 2024-08-10 ENCOUNTER — Encounter (HOSPITAL_COMMUNITY): Payer: Self-pay | Admitting: Student

## 2024-08-10 ENCOUNTER — Observation Stay (HOSPITAL_COMMUNITY)

## 2024-08-10 ENCOUNTER — Other Ambulatory Visit: Payer: Self-pay

## 2024-08-10 DIAGNOSIS — R000 Tachycardia, unspecified: Secondary | ICD-10-CM | POA: Diagnosis present

## 2024-08-10 DIAGNOSIS — Z8249 Family history of ischemic heart disease and other diseases of the circulatory system: Secondary | ICD-10-CM | POA: Diagnosis not present

## 2024-08-10 DIAGNOSIS — I160 Hypertensive urgency: Secondary | ICD-10-CM | POA: Diagnosis present

## 2024-08-10 DIAGNOSIS — R569 Unspecified convulsions: Secondary | ICD-10-CM | POA: Diagnosis present

## 2024-08-10 DIAGNOSIS — R9431 Abnormal electrocardiogram [ECG] [EKG]: Secondary | ICD-10-CM

## 2024-08-10 DIAGNOSIS — K219 Gastro-esophageal reflux disease without esophagitis: Secondary | ICD-10-CM | POA: Diagnosis present

## 2024-08-10 DIAGNOSIS — Z91048 Other nonmedicinal substance allergy status: Secondary | ICD-10-CM | POA: Diagnosis not present

## 2024-08-10 DIAGNOSIS — Z91040 Latex allergy status: Secondary | ICD-10-CM | POA: Diagnosis not present

## 2024-08-10 DIAGNOSIS — J45909 Unspecified asthma, uncomplicated: Secondary | ICD-10-CM | POA: Diagnosis present

## 2024-08-10 DIAGNOSIS — Z794 Long term (current) use of insulin: Secondary | ICD-10-CM | POA: Diagnosis not present

## 2024-08-10 DIAGNOSIS — Z7982 Long term (current) use of aspirin: Secondary | ICD-10-CM | POA: Diagnosis not present

## 2024-08-10 DIAGNOSIS — D735 Infarction of spleen: Secondary | ICD-10-CM | POA: Diagnosis present

## 2024-08-10 DIAGNOSIS — R809 Proteinuria, unspecified: Secondary | ICD-10-CM | POA: Diagnosis present

## 2024-08-10 DIAGNOSIS — G40909 Epilepsy, unspecified, not intractable, without status epilepticus: Secondary | ICD-10-CM | POA: Diagnosis present

## 2024-08-10 DIAGNOSIS — R112 Nausea with vomiting, unspecified: Secondary | ICD-10-CM | POA: Diagnosis present

## 2024-08-10 DIAGNOSIS — E1140 Type 2 diabetes mellitus with diabetic neuropathy, unspecified: Secondary | ICD-10-CM | POA: Diagnosis present

## 2024-08-10 DIAGNOSIS — Z87891 Personal history of nicotine dependence: Secondary | ICD-10-CM | POA: Diagnosis not present

## 2024-08-10 DIAGNOSIS — E876 Hypokalemia: Secondary | ICD-10-CM | POA: Diagnosis present

## 2024-08-10 DIAGNOSIS — R81 Glycosuria: Secondary | ICD-10-CM | POA: Diagnosis present

## 2024-08-10 DIAGNOSIS — Z1152 Encounter for screening for COVID-19: Secondary | ICD-10-CM | POA: Diagnosis not present

## 2024-08-10 DIAGNOSIS — Z91018 Allergy to other foods: Secondary | ICD-10-CM | POA: Diagnosis not present

## 2024-08-10 DIAGNOSIS — A419 Sepsis, unspecified organism: Secondary | ICD-10-CM | POA: Diagnosis not present

## 2024-08-10 DIAGNOSIS — E785 Hyperlipidemia, unspecified: Secondary | ICD-10-CM | POA: Diagnosis present

## 2024-08-10 DIAGNOSIS — Q796 Ehlers-Danlos syndrome, unspecified: Secondary | ICD-10-CM | POA: Diagnosis not present

## 2024-08-10 DIAGNOSIS — R823 Hemoglobinuria: Secondary | ICD-10-CM | POA: Diagnosis present

## 2024-08-10 DIAGNOSIS — I10 Essential (primary) hypertension: Secondary | ICD-10-CM | POA: Diagnosis present

## 2024-08-10 DIAGNOSIS — F39 Unspecified mood [affective] disorder: Secondary | ICD-10-CM | POA: Diagnosis present

## 2024-08-10 LAB — BASIC METABOLIC PANEL WITH GFR
Anion gap: 10 (ref 5–15)
BUN: 12 mg/dL (ref 6–20)
CO2: 23 mmol/L (ref 22–32)
Calcium: 9.3 mg/dL (ref 8.9–10.3)
Chloride: 102 mmol/L (ref 98–111)
Creatinine, Ser: 0.69 mg/dL (ref 0.44–1.00)
GFR, Estimated: >60 mL/min
Glucose, Bld: 186 mg/dL — ABNORMAL HIGH (ref 70–99)
Potassium: 3.4 mmol/L — ABNORMAL LOW (ref 3.5–5.1)
Sodium: 135 mmol/L (ref 135–145)

## 2024-08-10 LAB — RESPIRATORY PANEL BY PCR

## 2024-08-10 LAB — GLUCOSE, CAPILLARY
Glucose-Capillary: 171 mg/dL — ABNORMAL HIGH (ref 70–99)
Glucose-Capillary: 141 mg/dL — ABNORMAL HIGH (ref 70–99)
Glucose-Capillary: 182 mg/dL — ABNORMAL HIGH (ref 70–99)
Glucose-Capillary: 124 mg/dL — ABNORMAL HIGH (ref 70–99)
Glucose-Capillary: 106 mg/dL — ABNORMAL HIGH (ref 70–99)
Glucose-Capillary: 104 mg/dL — ABNORMAL HIGH (ref 70–99)
Glucose-Capillary: 74 mg/dL (ref 70–99)

## 2024-08-10 LAB — CBC
HCT: 38.8 % (ref 36.0–46.0)
Hemoglobin: 12.6 g/dL (ref 12.0–15.0)
MCH: 24.7 pg — ABNORMAL LOW (ref 26.0–34.0)
MCHC: 32.5 g/dL (ref 30.0–36.0)
MCV: 75.9 fL — ABNORMAL LOW (ref 80.0–100.0)
Platelets: 233 K/uL (ref 150–400)
RBC: 5.11 MIL/uL (ref 3.87–5.11)
RDW: 14.8 % (ref 11.5–15.5)
WBC: 11.9 K/uL — ABNORMAL HIGH (ref 4.0–10.5)
nRBC: 0 % (ref 0.0–0.2)

## 2024-08-10 LAB — PHOSPHORUS: Phosphorus: 3 mg/dL (ref 2.5–4.6)

## 2024-08-10 LAB — ECHOCARDIOGRAM COMPLETE
Area-P 1/2: 6.17 cm2
Calc EF: 53.3 %
Height: 62 in
S' Lateral: 2.75 cm
Single Plane A2C EF: 55.5 %
Single Plane A4C EF: 51.2 %
Weight: 2380.97 [oz_av]

## 2024-08-10 LAB — LACTIC ACID, PLASMA
Lactic Acid, Venous: 1.3 mmol/L (ref 0.5–1.9)
Lactic Acid, Venous: 1 mmol/L (ref 0.5–1.9)

## 2024-08-10 LAB — MRSA NEXT GEN BY PCR, NASAL: MRSA by PCR Next Gen: NOT DETECTED

## 2024-08-10 LAB — MAGNESIUM: Magnesium: 2 mg/dL (ref 1.7–2.4)

## 2024-08-10 LAB — HIV ANTIBODY (ROUTINE TESTING W REFLEX): HIV Screen 4th Generation wRfx: NONREACTIVE

## 2024-08-10 MED ORDER — VANCOMYCIN HCL 1500 MG/300ML IV SOLN
1500.0000 mg | INTRAVENOUS | Status: DC
  Administered 2024-08-10: 1500 mg via INTRAVENOUS
  Filled 2024-08-10: qty 300

## 2024-08-10 MED ORDER — LEVETIRACETAM 500 MG PO TABS: 1500.0000 mg | ORAL_TABLET | Freq: Two times a day (BID) | ORAL | Status: DC

## 2024-08-10 MED ORDER — TOPIRAMATE 100 MG PO TABS
100.0000 mg | ORAL_TABLET | Freq: Two times a day (BID) | ORAL | Status: DC
  Administered 2024-08-10 – 2024-08-12 (×5): 100 mg via ORAL
  Filled 2024-08-10 (×5): qty 1

## 2024-08-10 MED ORDER — ASPIRIN 81 MG PO CHEW
81.0000 mg | CHEWABLE_TABLET | Freq: Every day | ORAL | Status: DC
  Administered 2024-08-10 – 2024-08-11 (×2): 81 mg via ORAL
  Filled 2024-08-10 (×2): qty 1

## 2024-08-10 MED ORDER — ATORVASTATIN CALCIUM 40 MG PO TABS
80.0000 mg | ORAL_TABLET | Freq: Every day | ORAL | Status: DC
  Administered 2024-08-10 – 2024-08-12 (×3): 80 mg via ORAL
  Filled 2024-08-10 (×3): qty 2

## 2024-08-10 MED ORDER — FAMOTIDINE 20 MG PO TABS
40.0000 mg | ORAL_TABLET | Freq: Two times a day (BID) | ORAL | Status: DC
  Administered 2024-08-10 – 2024-08-12 (×5): 40 mg via ORAL
  Filled 2024-08-10 (×5): qty 2

## 2024-08-10 MED ORDER — SODIUM CHLORIDE 0.9 % IV SOLN
2.0000 g | INTRAVENOUS | Status: DC
  Administered 2024-08-10: 2 g via INTRAVENOUS
  Filled 2024-08-10: qty 20

## 2024-08-10 MED ORDER — GABAPENTIN 300 MG PO CAPS
600.0000 mg | ORAL_CAPSULE | Freq: Two times a day (BID) | ORAL | Status: DC
  Administered 2024-08-10 – 2024-08-12 (×4): 600 mg via ORAL
  Filled 2024-08-10 (×4): qty 2

## 2024-08-10 MED ORDER — SODIUM CHLORIDE 0.9 % IV SOLN
2.0000 g | Freq: Three times a day (TID) | INTRAVENOUS | Status: DC
  Administered 2024-08-10: 2 g via INTRAVENOUS
  Filled 2024-08-10: qty 12.5

## 2024-08-10 MED ORDER — CYCLOBENZAPRINE HCL 10 MG PO TABS
10.0000 mg | ORAL_TABLET | Freq: Three times a day (TID) | ORAL | Status: DC | PRN
  Administered 2024-08-10 – 2024-08-11 (×3): 10 mg via ORAL
  Filled 2024-08-10 (×3): qty 1

## 2024-08-10 MED ORDER — HYDRALAZINE HCL 20 MG/ML IJ SOLN: 10.0000 mg | Freq: Three times a day (TID) | INTRAMUSCULAR | Status: DC | PRN

## 2024-08-10 MED ORDER — GABAPENTIN 300 MG PO CAPS: 600.0000 mg | ORAL_CAPSULE | ORAL | Status: DC

## 2024-08-10 MED ORDER — LEVETIRACETAM (KEPPRA) 500 MG/5 ML ADULT IV PUSH
1500.0000 mg | Freq: Two times a day (BID) | INTRAVENOUS | Status: DC
  Administered 2024-08-10 – 2024-08-12 (×5): 1500 mg via INTRAVENOUS
  Filled 2024-08-10 (×6): qty 15

## 2024-08-10 MED ORDER — HYDROXYZINE HCL 25 MG PO TABS
50.0000 mg | ORAL_TABLET | Freq: Three times a day (TID) | ORAL | Status: DC | PRN
  Administered 2024-08-10 – 2024-08-12 (×2): 50 mg via ORAL
  Filled 2024-08-10 (×2): qty 2

## 2024-08-10 MED ORDER — LORAZEPAM 2 MG/ML IJ SOLN
1.0000 mg | INTRAMUSCULAR | Status: DC | PRN
  Administered 2024-08-10: 1 mg via INTRAVENOUS
  Filled 2024-08-10: qty 1

## 2024-08-10 MED ORDER — HYDROMORPHONE HCL 1 MG/ML IJ SOLN
0.5000 mg | Freq: Four times a day (QID) | INTRAMUSCULAR | Status: DC | PRN
  Administered 2024-08-10 – 2024-08-12 (×5): 0.5 mg via INTRAVENOUS
  Filled 2024-08-10: qty 0.5
  Filled 2024-08-10 (×2): qty 1
  Filled 2024-08-10: qty 0.5
  Filled 2024-08-10: qty 1
  Filled 2024-08-10: qty 0.5

## 2024-08-10 MED ORDER — GABAPENTIN 300 MG PO CAPS
900.0000 mg | ORAL_CAPSULE | Freq: Every day | ORAL | Status: DC
  Administered 2024-08-10 – 2024-08-11 (×2): 900 mg via ORAL
  Filled 2024-08-10 (×2): qty 3

## 2024-08-10 MED ORDER — POTASSIUM CHLORIDE 10 MEQ/100ML IV SOLN
10.0000 meq | INTRAVENOUS | Status: AC
  Administered 2024-08-10 (×4): 10 meq via INTRAVENOUS
  Filled 2024-08-10 (×4): qty 100

## 2024-08-10 MED ORDER — METRONIDAZOLE 500 MG/100ML IV SOLN
500.0000 mg | Freq: Two times a day (BID) | INTRAVENOUS | Status: DC
  Administered 2024-08-10 – 2024-08-11 (×3): 500 mg via INTRAVENOUS
  Filled 2024-08-10 (×3): qty 100

## 2024-08-10 MED ORDER — AMLODIPINE BESYLATE 10 MG PO TABS
10.0000 mg | ORAL_TABLET | Freq: Every day | ORAL | Status: DC
  Administered 2024-08-10 – 2024-08-12 (×3): 10 mg via ORAL
  Filled 2024-08-10 (×3): qty 1

## 2024-08-10 MED ORDER — CHLORHEXIDINE GLUCONATE CLOTH 2 % EX PADS
6.0000 | MEDICATED_PAD | Freq: Every day | CUTANEOUS | Status: DC
  Administered 2024-08-10: 6 via TOPICAL

## 2024-08-10 MED ORDER — INFLUENZA VIRUS VACC SPLIT PF (FLUZONE) 0.5 ML IM SUSY: 0.5000 mL | PREFILLED_SYRINGE | Freq: Once | INTRAMUSCULAR | Status: DC | PRN

## 2024-08-10 NOTE — Progress Notes (Signed)
 Called report to [facility]

## 2024-08-10 NOTE — Plan of Care (Signed)
" °  Problem: Education: Goal: Ability to describe self-care measures that may prevent or decrease complications (Diabetes Survival Skills Education) will improve Outcome: Progressing Goal: Individualized Educational Video(s) Outcome: Progressing   Problem: Clinical Measurements: Goal: Ability to maintain clinical measurements within normal limits will improve Outcome: Progressing Goal: Will remain free from infection Outcome: Progressing Goal: Diagnostic test results will improve Outcome: Progressing Goal: Respiratory complications will improve Outcome: Progressing Goal: Cardiovascular complication will be avoided Outcome: Progressing   Problem: Safety: Goal: Ability to remain free from injury will improve Outcome: Progressing   "

## 2024-08-10 NOTE — Sepsis Progress Note (Signed)
 Notified provider via secure of need to order lactic acid.

## 2024-08-10 NOTE — Progress Notes (Signed)
" °  Echocardiogram 2D Echocardiogram has been performed.  Devora Ellouise SAUNDERS 08/10/2024, 9:47 AM "

## 2024-08-10 NOTE — Progress Notes (Signed)
 Pharmacy Antibiotic Note  Sara [name] is a 48 y.o. female admitted on 08/09/2024 with sepsis.  Pharmacy has been consulted for vacnomycin dosing.  Plan: Vancomycin  1500mg  IV q24h (AUC 507.2, Scr  used 0.8) Follow renal function and clinical course  Height: 5' 2 (157.5 cm) Weight: 67.5 kg (148 lb 13 oz) IBW/kg (Calculated) : 50.1  Temp (24hrs), Avg:98.4 F (36.9 C), Min:98.1 F (36.7 C), Max:98.9 F (37.2 C)  Recent Labs  Lab 08/09/24 1540 08/09/24 2046 08/10/24 0102  WBC 12.6*  --  11.9*  CREATININE 1.00 0.73 0.69  LATICACIDVEN  --   --  1.3    Estimated Creatinine Clearance: 77.5 mL/min (by C-G formula based on SCr of 0.69 mg/dL).    Allergies[1]  Antimicrobials this admission: 12/24 vanc >> 12/24 cefepime  >>  Dose adjustments this admission:   Microbiology results: 12/24 BCx:  12/24 MRSA PCR:   Thank you for allowing pharmacy to be a part of this patients care.  Leeroy Mace RPh 08/10/2024, 2:24 AM     [1]  Allergies Allergen Reactions   Other Hives, Itching and Rash    Coban, patient states that skin turn red and has serve itching  Coban, patient states that skin turn red and has serve itching  Coban, patient states that skin turn red and has serve itching  Coban, patient states that skin turn red and has serve itching     Coconut (Cocos Nucifera) Itching and Swelling    Coconut,  Facial swelling   Coconut Fatty Acid Itching   Latex Itching and Rash    (Elastic in underwear)   Tape Rash

## 2024-08-10 NOTE — Plan of Care (Signed)
  Problem: Clinical Measurements: Goal: Will remain free from infection Outcome: Progressing   Problem: Coping: Goal: Level of anxiety will decrease Outcome: Progressing   Problem: Elimination: Goal: Will not experience complications related to bowel motility Outcome: Progressing Goal: Will not experience complications related to urinary retention Outcome: Progressing   Problem: Pain Managment: Goal: General experience of comfort will improve and/or be controlled Outcome: Progressing   Problem: Safety: Goal: Ability to remain free from injury will improve Outcome: Progressing   Problem: Skin Integrity: Goal: Risk for impaired skin integrity will decrease Outcome: Progressing

## 2024-08-10 NOTE — Progress Notes (Addendum)
 "  [facility] PROGRESS NOTE   Jisel Fleet FMW:[id number] DOB: Sep 02, 1975 DOA: 08/09/2024  PCP: [name], MD  Brief History: 48 y.o. female with medical history significant for T2DM, HLD, asthma, HTN, CVA, Ehlers-Danlos, seizure disorder, diabetic neuropathy, DDD, chronic back pain, migraine headache, anemia, osteoarthritis and GERD who presented to the ED for evaluation of nausea, vomiting and muscle aches.  Evaluation in the ED revealed possible splenic infarct.  Subsequently she ended up having seizure activity.  She was hospitalized for further management.    Consultants: None yet  Procedures: None    Subjective/Interval History: Patient had another episode of seizure this morning.  She was given Ativan .  Looks like she was loaded with Keppra  overnight.  She was given her a.m. dose of Keppra  this morning.  She slowly started waking up.  Husband is at the bedside.  CT head was ordered however patient and husband declined CT head.  Patient to be transferred to stepdown unit for closer monitoring considering frequency of her seizure activity.    Assessment/Plan:  Intractable nausea and vomiting CT head did not show any bowel obstruction.  Did show splenic infarct etiology of which is unclear. Patient's nausea vomiting has improved.  Abdomen is benign though she is mildly tender in the left upper quadrant. Continue to monitor.  Concern for occult infection She is noted to be afebrile.  WBC is only mildly elevated.  Lactic acid level was noted to be normal.  No obvious source of infection identified.  Follow-up on blood cultures. She has been empirically started on vancomycin  and cefepime  and metronidazole  which can be continued for now.  Will check procalcitonin. Due to her seizure activity will discontinue cefepime  and start ceftriaxone .  Seizures with breakthrough activity Patient with longstanding history of seizures.  Has not been able to take her  medications in the last 2 days due to nausea and vomiting.  She had a seizure activity last night and again this morning.  She was loaded with Keppra  overnight.  Change Keppra  to intravenous.  She is also on Topamax .  She was given Ativan  again this morning.  Transferred to ICU for closer monitoring.  If she continues to have seizure activity despite treatment then will involve neurology. CT head was ordered however patient and husband declined.  This is reasonable considering there are no focal deficits that are noted.  She is afebrile.  Do not suspect meningitis at this time.  Splenic infarct Etiology unclear.  Follow-up on echocardiogram.  Currently on prophylactic doses of Lovenox .  Will initiate hypercoagulable workup.  May need to consider full anticoagulation.  Sinus tachycardia Most likely secondary to seizure.  Follow-up on echocardiogram.  Will check TSH.  Monitor on telemetry.  Ehlers-Danlos syndrome This is chronic issue for her.  Diabetes mellitus type 2 HbA1c was 5.62 months ago.  Monitor CBGs.  SSI.  Essential hypertension Monitor blood pressures.  Stable as of now.  DVT Prophylaxis: Lovenox  Code Status: Full code Family Communication: Discussed with her husband Disposition Plan: Return home when improved  Status is: Inpatient Remains inpatient appropriate because: Seizure activity, splenic infarct    Medications: Scheduled:  enoxaparin  (LOVENOX ) injection  40 mg Subcutaneous Q24H   insulin  aspart  0-9 Units Subcutaneous Q4H   levETIRAcetam   1,500 mg Intravenous Q12H   potassium chloride   40 mEq Oral BID   topiramate   100 mg Oral BID   Continuous:  ceFEPime  (MAXIPIME ) IV 2 g (08/10/24 0559)   lactated ringers  Stopped (08/10/24  0559)   metronidazole  500 mg (08/10/24 0834)   vancomycin      PRN:acetaminophen  **OR** acetaminophen , bisacodyl , hydrALAZINE , HYDROmorphone  (DILAUDID ) injection, influenza vac split trivalent PF, LORazepam , ondansetron  **OR** ondansetron   (ZOFRAN ) IV, senna-docusate  Antibiotics: Anti-infectives (From admission, onward)    Start     Dose/Rate Route Frequency Ordered Stop   08/10/24 2200  vancomycin  (VANCOREADY) IVPB 1500 mg/300 mL        1,500 mg 150 mL/hr over 120 Minutes Intravenous Every 24 hours 08/10/24 0222     08/10/24 0945  metroNIDAZOLE  (FLAGYL ) IVPB 500 mg        500 mg 100 mL/hr over 60 Minutes Intravenous Every 12 hours 08/10/24 0204     08/10/24 0600  ceFEPIme  (MAXIPIME ) 2 g in sodium chloride  0.9 % 100 mL IVPB        2 g 200 mL/hr over 30 Minutes Intravenous Every 8 hours 08/10/24 0220     08/09/24 2145  ceFEPIme  (MAXIPIME ) 2 g in sodium chloride  0.9 % 100 mL IVPB        2 g 200 mL/hr over 30 Minutes Intravenous  Once 08/09/24 2144 08/09/24 2236   08/09/24 2145  metroNIDAZOLE  (FLAGYL ) IVPB 500 mg        500 mg 100 mL/hr over 60 Minutes Intravenous  Once 08/09/24 2144 08/09/24 2301   08/09/24 2145  vancomycin  (VANCOCIN ) IVPB 1000 mg/200 mL premix        1,000 mg 200 mL/hr over 60 Minutes Intravenous  Once 08/09/24 2144 08/10/24 0004       Objective:  Vital Signs  Vitals:   08/10/24 0751 08/10/24 0800 08/10/24 0810 08/10/24 0953  BP: (!) 134/93 (!) 122/110 (!) 143/110 134/81  Pulse: (!) 124 (!) 122 (!) 117 (!) 119  Resp:  (!) 22 (!) 36 19  Temp: (!) 97.5 F (36.4 C)   99.3 F (37.4 C)  TempSrc: Oral   Oral  SpO2: 100%   100%  Weight:      Height:        Intake/Output Summary (Last 24 hours) at 08/10/2024 1049 Last data filed at 08/10/2024 9071 Gross per 24 hour  Intake 902.55 ml  Output --  Net 902.55 ml   Filed Weights   08/10/24 0048  Weight: 67.5 kg    General appearance: Confused after her seizure. Resp: Clear to auscultation bilaterally.  Normal effort Cardio: S1-S2 is normal regular.  No S3-S4.  No rubs murmurs or bruit GI: Abdomen is soft.  Tender in the left upper quadrant.  No rebound rigidity or guarding.  Bowel sounds present. Extremities: No edema.  Moving all of  her extremities Neurologic: No focal neurological deficits.    Lab Results:  Data Reviewed: I have personally reviewed following labs and reports of the imaging studies  CBC: Recent Labs  Lab 08/09/24 1540 08/10/24 0102  WBC 12.6* 11.9*  HGB 14.0 12.6  HCT 44.2 38.8  MCV 77.4* 75.9*  PLT 219 233    Basic Metabolic Panel: Recent Labs  Lab 08/09/24 1540 08/09/24 2046 08/10/24 0102  NA 136 136 135  K 3.3* 3.4* 3.4*  CL 100 100 102  CO2 20* 23 23  GLUCOSE 246* 132* 186*  BUN 18 14 12   CREATININE 1.00 0.73 0.69  CALCIUM  10.0 9.6 9.3  MG  --  2.0  --   PHOS  --  3.0  --     GFR: Estimated Creatinine Clearance: 77.5 mL/min (by C-G formula based on SCr of 0.69 mg/dL).  Liver Function Tests: Recent Labs  Lab 08/09/24 1540  AST 18  ALT 16  ALKPHOS 113  BILITOT 0.5  PROT 8.5*  ALBUMIN 4.5    Recent Labs  Lab 08/09/24 1540  LIPASE 34    CBG: Recent Labs  Lab 08/09/24 1734 08/10/24 0103 08/10/24 0404 08/10/24 0751  GLUCAP 195* 171* 141* 182*     Recent Results (from the past 240 hours)  Resp panel by RT-PCR (RSV, Flu A&B, Covid) Anterior Nasal Swab     Status: None   Collection Time: 08/09/24  3:40 PM   Specimen: Anterior Nasal Swab  Result Value Ref Range Status   SARS Coronavirus 2 by RT PCR NEGATIVE NEGATIVE Final    Comment: (NOTE) SARS-CoV-2 target nucleic acids are NOT DETECTED.  The SARS-CoV-2 RNA is generally detectable in upper respiratory specimens during the acute phase of infection. The lowest concentration of SARS-CoV-2 viral copies this assay can detect is 138 copies/mL. A negative result does not preclude SARS-Cov-2 infection and should not be used as the sole basis for treatment or other patient management decisions. A negative result may occur with  improper specimen collection/handling, submission of specimen other than nasopharyngeal swab, presence of viral mutation(s) within the areas targeted by this assay, and inadequate  number of viral copies(<138 copies/mL). A negative result must be combined with clinical observations, patient history, and epidemiological information. The expected result is Negative.  Fact Sheet for Patients:  bloggercourse.com  Fact Sheet for Healthcare Providers:  seriousbroker.it  This test is no t yet approved or cleared by the [place]  FDA and  has been authorized for detection and/or diagnosis of SARS-CoV-2 by FDA under an Emergency Use Authorization (EUA). This EUA will remain  in effect (meaning this test can be used) for the duration of the COVID-19 declaration under Section 564(b)(1) of the Act, 21 U.S.C.section 360bbb-3(b)(1), unless the authorization is terminated  or revoked sooner.       Influenza A by PCR NEGATIVE NEGATIVE Final   Influenza B by PCR NEGATIVE NEGATIVE Final    Comment: (NOTE) The Xpert Xpress SARS-CoV-2/FLU/RSV plus assay is intended as an aid in the diagnosis of influenza from Nasopharyngeal swab specimens and should not be used as a sole basis for treatment. Nasal washings and aspirates are unacceptable for Xpert Xpress SARS-CoV-2/FLU/RSV testing.  Fact Sheet for Patients: bloggercourse.com  Fact Sheet for Healthcare Providers: seriousbroker.it  This test is not yet approved or cleared by the [place]  FDA and has been authorized for detection and/or diagnosis of SARS-CoV-2 by FDA under an Emergency Use Authorization (EUA). This EUA will remain in effect (meaning this test can be used) for the duration of the COVID-19 declaration under Section 564(b)(1) of the Act, 21 U.S.C. section 360bbb-3(b)(1), unless the authorization is terminated or revoked.     Resp Syncytial Virus by PCR NEGATIVE NEGATIVE Final    Comment: (NOTE) Fact Sheet for Patients: bloggercourse.com  Fact Sheet for Healthcare  Providers: seriousbroker.it  This test is not yet approved or cleared by the [place]  FDA and has been authorized for detection and/or diagnosis of SARS-CoV-2 by FDA under an Emergency Use Authorization (EUA). This EUA will remain in effect (meaning this test can be used) for the duration of the COVID-19 declaration under Section 564(b)(1) of the Act, 21 U.S.C. section 360bbb-3(b)(1), unless the authorization is terminated or revoked.  Performed at [facility], [address] [place], [place] [address]   Respiratory (~20 pathogens) panel by PCR  Status: None   Collection Time: 08/09/24  3:40 PM   Specimen: Nasopharyngeal Swab; Respiratory  Result Value Ref Range Status   Adenovirus NOT DETECTED NOT DETECTED Corrected   Coronavirus 229E NOT DETECTED NOT DETECTED Corrected    Comment: (NOTE) The Coronavirus on the Respiratory Panel, DOES NOT test for the novel  Coronavirus (2019 nCoV) CORRECTED ON 12/25 AT 0401: PREVIOUSLY REPORTED AS NOT DETECTED    Coronavirus HKU1 NOT DETECTED NOT DETECTED Corrected   Coronavirus NL63 NOT DETECTED NOT DETECTED Corrected   Coronavirus OC43 NOT DETECTED NOT DETECTED Corrected   Metapneumovirus NOT DETECTED NOT DETECTED Corrected   Rhinovirus / Enterovirus NOT DETECTED NOT DETECTED Corrected   Influenza A NOT DETECTED NOT DETECTED Corrected   Influenza B NOT DETECTED NOT DETECTED Corrected   Parainfluenza Virus 1 NOT DETECTED NOT DETECTED Corrected   Parainfluenza Virus 2 NOT DETECTED NOT DETECTED Corrected   Parainfluenza Virus 3 NOT DETECTED NOT DETECTED Corrected   Parainfluenza Virus 4 NOT DETECTED NOT DETECTED Corrected   Respiratory Syncytial Virus NOT DETECTED NOT DETECTED Corrected   Bordetella pertussis NOT DETECTED NOT DETECTED Corrected   Bordetella Parapertussis NOT DETECTED NOT DETECTED Corrected   Chlamydophila pneumoniae NOT DETECTED NOT DETECTED Corrected   Mycoplasma  pneumoniae NOT DETECTED NOT DETECTED Corrected    Comment: Performed at [facility], [address], [place], [place] [address]  MRSA Next Gen by PCR, Nasal     Status: None   Collection Time: 08/10/24  8:47 AM   Specimen: Nasal Mucosa; Nasal Swab  Result Value Ref Range Status   MRSA by PCR Next Gen NOT DETECTED NOT DETECTED Final    Comment: (NOTE) The GeneXpert MRSA Assay (FDA approved for NASAL specimens only), is one component of a comprehensive MRSA colonization surveillance program. It is not intended to diagnose MRSA infection nor to guide or monitor treatment for MRSA infections. Test performance is not FDA approved in patients less than 14 years old. Performed at [facility], [address] [place], [place] [address]       Radiology Studies: CT ABDOMEN PELVIS W CONTRAST Result Date: 08/09/2024 CLINICAL DATA:  Abdominal pain. EXAM: CT ABDOMEN AND PELVIS WITH CONTRAST TECHNIQUE: Multidetector CT imaging of the abdomen and pelvis was performed using the standard protocol following bolus administration of intravenous contrast. RADIATION DOSE REDUCTION: This exam was performed according to the departmental dose-optimization program which includes automated exposure control, adjustment of the mA and/or kV according to patient size and/or use of iterative reconstruction technique. CONTRAST:  OMNIPAQUE  IOHEXOL  300 MG/ML  SOLN COMPARISON:  CT abdomen pelvis dated 11/25/2022. FINDINGS: Lower chest: The visualized lung bases are clear. No intra-abdominal free air or free fluid. Hepatobiliary: The liver is unremarkable no biliary dilatation. No calcified gallstone or pericholecystic fluid. Pancreas: Unremarkable. No pancreatic ductal dilatation or surrounding inflammatory changes. Spleen: There is a area of decreased enhancement in the superior pole of the spleen suspicious for splenic infarct. Splenic laceration is not excluded if there is clinical history of  trauma. There is mild stranding of the adjacent fat plane which may represent edema or small hemorrhage. No large fluid collection or hematoma. Adrenals/Urinary Tract: The adrenal glands are unremarkable. There is no hydronephrosis on either side. The visualized ureters and urinary bladder probable Stomach/Bowel: There is no bowel obstruction or active inflammation. The appendix is normal. Vascular/Lymphatic: Mild atherosclerotic calcification of the abdominal aorta. The IVC is unremarkable. No portal venous gas. There is no adenopathy. Reproductive:  Hysterectomy. No suspicious adnexal masses. Bilateral ovarian cysts measure up to 2.7 cm on the right. No imaging follow-up. Other: None Musculoskeletal: Moderate bilateral hip arthritic changes. No acute osseous pathology. T9 butterfly vertebra. IMPRESSION: 1. Splenic infarct versus less likely laceration. Correlation with clinical history of trauma recommended. 2. No bowel obstruction. Normal appendix. 3.  Aortic Atherosclerosis (ICD10-I70.0). Electronically Signed   By: Vanetta Chou M.D.   On: 08/09/2024 17:39       LOS: 0 days   Catalaya Garr  [facility] Pager on [email or web address]  08/10/2024, 10:49 AM   "

## 2024-08-10 NOTE — Plan of Care (Signed)
  Problem: Coping: Goal: Ability to adjust to condition or change in health will improve Outcome: Progressing   Problem: Fluid Volume: Goal: Ability to maintain a balanced intake and output will improve Outcome: Progressing   Problem: Health Behavior/Discharge Planning: Goal: Ability to identify and utilize available resources and services will improve Outcome: Progressing Goal: Ability to manage health-related needs will improve Outcome: Progressing

## 2024-08-11 ENCOUNTER — Telehealth (HOSPITAL_COMMUNITY): Payer: Self-pay

## 2024-08-11 ENCOUNTER — Other Ambulatory Visit (HOSPITAL_COMMUNITY): Payer: Self-pay

## 2024-08-11 DIAGNOSIS — R000 Tachycardia, unspecified: Secondary | ICD-10-CM | POA: Diagnosis not present

## 2024-08-11 DIAGNOSIS — R569 Unspecified convulsions: Secondary | ICD-10-CM | POA: Diagnosis not present

## 2024-08-11 DIAGNOSIS — D735 Infarction of spleen: Secondary | ICD-10-CM | POA: Diagnosis not present

## 2024-08-11 LAB — CBC
HCT: 38.3 % (ref 36.0–46.0)
Hemoglobin: 12.1 g/dL (ref 12.0–15.0)
MCH: 24.9 pg — ABNORMAL LOW (ref 26.0–34.0)
MCHC: 31.6 g/dL (ref 30.0–36.0)
MCV: 79 fL — ABNORMAL LOW (ref 80.0–100.0)
Platelets: 163 K/uL (ref 150–400)
RBC: 4.85 MIL/uL (ref 3.87–5.11)
RDW: 15.2 % (ref 11.5–15.5)
WBC: 8.9 K/uL (ref 4.0–10.5)
nRBC: 0 % (ref 0.0–0.2)

## 2024-08-11 LAB — BASIC METABOLIC PANEL WITH GFR
Anion gap: 12 (ref 5–15)
BUN: 14 mg/dL (ref 6–20)
CO2: 20 mmol/L — ABNORMAL LOW (ref 22–32)
Calcium: 9.2 mg/dL (ref 8.9–10.3)
Chloride: 106 mmol/L (ref 98–111)
Creatinine, Ser: 0.83 mg/dL (ref 0.44–1.00)
GFR, Estimated: >60 mL/min
Glucose, Bld: 70 mg/dL (ref 70–99)
Potassium: 3.4 mmol/L — ABNORMAL LOW (ref 3.5–5.1)
Sodium: 138 mmol/L (ref 135–145)

## 2024-08-11 LAB — GLUCOSE, CAPILLARY
Glucose-Capillary: 117 mg/dL — ABNORMAL HIGH (ref 70–99)
Glucose-Capillary: 118 mg/dL — ABNORMAL HIGH (ref 70–99)
Glucose-Capillary: 134 mg/dL — ABNORMAL HIGH (ref 70–99)
Glucose-Capillary: 263 mg/dL — ABNORMAL HIGH (ref 70–99)
Glucose-Capillary: 81 mg/dL (ref 70–99)
Glucose-Capillary: 85 mg/dL (ref 70–99)

## 2024-08-11 LAB — TSH: TSH: 2.24 u[IU]/mL (ref 0.350–4.500)

## 2024-08-11 LAB — PROCALCITONIN: Procalcitonin: <0.1 ng/mL

## 2024-08-11 LAB — MAGNESIUM: Magnesium: 2.1 mg/dL (ref 1.7–2.4)

## 2024-08-11 LAB — ANTITHROMBIN III: AntiThromb III Func: 124 % — ABNORMAL HIGH (ref 75–120)

## 2024-08-11 MED ORDER — APIXABAN 5 MG PO TABS: 5.0000 mg | ORAL_TABLET | Freq: Two times a day (BID) | ORAL | Status: DC

## 2024-08-11 MED ORDER — POTASSIUM CHLORIDE 10 MEQ/100ML IV SOLN
10.0000 meq | INTRAVENOUS | Status: AC
  Administered 2024-08-11 (×4): 10 meq via INTRAVENOUS
  Filled 2024-08-11 (×4): qty 100

## 2024-08-11 MED ORDER — APIXABAN 5 MG PO TABS
10.0000 mg | ORAL_TABLET | Freq: Two times a day (BID) | ORAL | Status: DC
  Administered 2024-08-11 – 2024-08-12 (×3): 10 mg via ORAL
  Filled 2024-08-11 (×3): qty 2

## 2024-08-11 NOTE — Progress Notes (Addendum)
 "  [facility] PROGRESS NOTE   [name]:[id number] DOB: 10-Sep-1975 DOA: 08/09/2024  PCP: [name], MD  Brief History: 48 y.o. female with medical history significant for T2DM, HLD, asthma, HTN, CVA, Ehlers-Danlos, seizure disorder, diabetic neuropathy, DDD, chronic back pain, migraine headache, anemia, osteoarthritis and GERD who presented to the ED for evaluation of nausea, vomiting and muscle aches.  Evaluation in the ED revealed possible splenic infarct.  Subsequently she ended up having seizure activity.  She was hospitalized for further management.    Consultants: None yet  Procedures: None    Subjective/Interval History: Patient complaining of muscle cramps.  Some abdominal pain but not as bad as 2 days ago.  No further seizure activity.  Discussed with nursing staff as well.     Assessment/Plan:  Splenic infarct Etiology unclear.  Echocardiogram shows normal systolic function.  No clot identified.  Hypercoagulable workup ordered this morning.  She is on aspirin .  She is on DVT prophylaxis with Lovenox .  Patient denies any previous history of blood clots.  Will run this by hematology to see if she warrants initiation of anticoagulation.   Discussed with Dr. Autumn with hematology who recommends Eliquis  for at least 3 months.  He will arrange outpatient follow-up to go over the hypercoagulable panel with the patient and determine further course of action.  All of this discussed with patient and she is agreeable to initiate Eliquis .  We will stop her aspirin  in the interim.  She was asked to monitor closely for signs of bleeding.  Intractable nausea and vomiting CT head did not show any bowel obstruction.  Did show splenic infarct etiology of which is unclear. Patient's nausea vomiting has improved.  Abdomen is benign though she is mildly tender in the left upper quadrant. Symptoms most likely due to splenic infarct.  Concern for occult  infection She is noted to be afebrile.  WBC is only mildly elevated.  Lactic acid level was noted to be normal.  No obvious source of infection identified.  Follow-up on blood cultures. Patient was empirically started on vancomycin  and cefepime  and metronidazole .  She was changed over from cefepime  to ceftriaxone  due to his seizure activity. Procalcitonin noted to be less than 0.1.  WBC is normal.  Cultures without any growth so far.  MRSA PCR is nondetectable.  UA did not suggest infection.  Will discontinue antibiotics and observe for now.  Seizures with breakthrough activity Patient with longstanding history of seizures.  Has not been able to take her medications in the last 2 days due to nausea and vomiting.  She has had 2 episodes of seizure in the hospital.  Currently getting Keppra  intravenously.  Stable in the last 24 hours.  CT head was ordered however patient and husband declined to undergo the test yesterday.  Reasonable to hold for now. Do not suspect any meningitis at this time.  Neurologically she is stable.    Sinus tachycardia Most likely due to acute issues especially abdominal pain.  Heart rate has improved.  Saturations are normal.  She denies any chest pain.  Echocardiogram without any acute findings.  Right ventricular function was noted to be normal.  TSH is normal.  Continue to monitor.   Hypokalemia Supplement.  Magnesium is 2.1.  Ehlers-Danlos syndrome This is chronic issue for her.  Diabetes mellitus type 2 HbA1c was 5.62 months ago.  Monitor CBGs.  SSI.  Essential hypertension Monitor blood pressures.  Stable as of now.  DVT Prophylaxis: Lovenox   Code Status: Full code Family Communication: Discussed with her husband Disposition Plan: Return home when improved.  Start mobilizing    Medications: Scheduled:  amLODipine   10 mg Oral Daily   aspirin   81 mg Oral Daily   atorvastatin   80 mg Oral Daily   Chlorhexidine  Gluconate Cloth  6 each Topical Daily    enoxaparin  (LOVENOX ) injection  40 mg Subcutaneous Q24H   famotidine   40 mg Oral BID   gabapentin   600 mg Oral BID   gabapentin   900 mg Oral QHS   insulin  aspart  0-9 Units Subcutaneous Q4H   levETIRAcetam   1,500 mg Intravenous Q12H   topiramate   100 mg Oral BID   Continuous:  cefTRIAXone  (ROCEPHIN )  IV Stopped (08/10/24 1411)   metronidazole  Stopped (08/10/24 2308)   potassium chloride      vancomycin  Stopped (08/11/24 0120)   PRN:acetaminophen  **OR** acetaminophen , bisacodyl , cyclobenzaprine , hydrALAZINE , HYDROmorphone  (DILAUDID ) injection, hydrOXYzine , influenza vac split trivalent PF, LORazepam , ondansetron  **OR** ondansetron  (ZOFRAN ) IV, senna-docusate  Antibiotics: Anti-infectives (From admission, onward)    Start     Dose/Rate Route Frequency Ordered Stop   08/10/24 2200  vancomycin  (VANCOREADY) IVPB 1500 mg/300 mL        1,500 mg 150 mL/hr over 120 Minutes Intravenous Every 24 hours 08/10/24 0222     08/10/24 1400  cefTRIAXone  (ROCEPHIN ) 2 g in sodium chloride  0.9 % 100 mL IVPB        2 g 200 mL/hr over 30 Minutes Intravenous Every 24 hours 08/10/24 1104     08/10/24 0945  metroNIDAZOLE  (FLAGYL ) IVPB 500 mg        500 mg 100 mL/hr over 60 Minutes Intravenous Every 12 hours 08/10/24 0204     08/10/24 0600  ceFEPIme  (MAXIPIME ) 2 g in sodium chloride  0.9 % 100 mL IVPB  Status:  Discontinued        2 g 200 mL/hr over 30 Minutes Intravenous Every 8 hours 08/10/24 0220 08/10/24 1104   08/09/24 2145  ceFEPIme  (MAXIPIME ) 2 g in sodium chloride  0.9 % 100 mL IVPB        2 g 200 mL/hr over 30 Minutes Intravenous  Once 08/09/24 2144 08/09/24 2236   08/09/24 2145  metroNIDAZOLE  (FLAGYL ) IVPB 500 mg        500 mg 100 mL/hr over 60 Minutes Intravenous  Once 08/09/24 2144 08/09/24 2301   08/09/24 2145  vancomycin  (VANCOCIN ) IVPB 1000 mg/200 mL premix        1,000 mg 200 mL/hr over 60 Minutes Intravenous  Once 08/09/24 2144 08/10/24 0004       Objective:  Vital Signs  Vitals:    08/11/24 0300 08/11/24 0400 08/11/24 0800 08/11/24 0900  BP: 122/73 102/62 128/86 128/89  Pulse: (!) 108 99 (!) 104 (!) 106  Resp: (!) 22 14 11 13   Temp:  98.4 F (36.9 C)    TempSrc:  Oral    SpO2: 96% 92% 97% 99%  Weight:      Height:        Intake/Output Summary (Last 24 hours) at 08/11/2024 0944 Last data filed at 08/11/2024 0300 Gross per 24 hour  Intake 1097.44 ml  Output 450 ml  Net 647.44 ml   Filed Weights   08/10/24 0048  Weight: 67.5 kg    General appearance: Awake alert.  In no distress Resp: Clear to auscultation bilaterally.  Normal effort Cardio: S1-S2 is tachycardic regular.  No S3-S4.  No rubs murmurs or bruit GI: Abdomen is soft.  Tender  especially in the left upper quadrant without any rebound rigidity or guarding.  No masses organomegaly. Extremities: No edema.  Full range of motion of lower extremities. Neurologic: Alert and oriented x3.  No focal neurological deficits.    Lab Results:  Data Reviewed: I have personally reviewed following labs and reports of the imaging studies  CBC: Recent Labs  Lab 08/09/24 1540 08/10/24 0102 08/11/24 0739  WBC 12.6* 11.9* 8.9  HGB 14.0 12.6 12.1  HCT 44.2 38.8 38.3  MCV 77.4* 75.9* 79.0*  PLT 219 233 163    Basic Metabolic Panel: Recent Labs  Lab 08/09/24 1540 08/09/24 2046 08/10/24 0102 08/11/24 0739  NA 136 136 135 138  K 3.3* 3.4* 3.4* 3.4*  CL 100 100 102 106  CO2 20* 23 23 20*  GLUCOSE 246* 132* 186* 70  BUN 18 14 12 14   CREATININE 1.00 0.73 0.69 0.83  CALCIUM  10.0 9.6 9.3 9.2  MG  --  2.0  --  2.1  PHOS  --  3.0  --   --     GFR: Estimated Creatinine Clearance: 74.7 mL/min (by C-G formula based on SCr of 0.83 mg/dL).  Liver Function Tests: Recent Labs  Lab 08/09/24 1540  AST 18  ALT 16  ALKPHOS 113  BILITOT 0.5  PROT 8.5*  ALBUMIN 4.5    Recent Labs  Lab 08/09/24 1540  LIPASE 34    CBG: Recent Labs  Lab 08/10/24 1158 08/10/24 1556 08/10/24 1942 08/10/24 2313  08/11/24 0418  GLUCAP 124* 106* 104* 74 85     Recent Results (from the past 240 hours)  Resp panel by RT-PCR (RSV, Flu A&B, Covid) Anterior Nasal Swab     Status: None   Collection Time: 08/09/24  3:40 PM   Specimen: Anterior Nasal Swab  Result Value Ref Range Status   SARS Coronavirus 2 by RT PCR NEGATIVE NEGATIVE Final    Comment: (NOTE) SARS-CoV-2 target nucleic acids are NOT DETECTED.  The SARS-CoV-2 RNA is generally detectable in upper respiratory specimens during the acute phase of infection. The lowest concentration of SARS-CoV-2 viral copies this assay can detect is 138 copies/mL. A negative result does not preclude SARS-Cov-2 infection and should not be used as the sole basis for treatment or other patient management decisions. A negative result may occur with  improper specimen collection/handling, submission of specimen other than nasopharyngeal swab, presence of viral mutation(s) within the areas targeted by this assay, and inadequate number of viral copies(<138 copies/mL). A negative result must be combined with clinical observations, patient history, and epidemiological information. The expected result is Negative.  Fact Sheet for Patients:  bloggercourse.com  Fact Sheet for Healthcare Providers:  seriousbroker.it  This test is no t yet approved or cleared by the [place]  FDA and  has been authorized for detection and/or diagnosis of SARS-CoV-2 by FDA under an Emergency Use Authorization (EUA). This EUA will remain  in effect (meaning this test can be used) for the duration of the COVID-19 declaration under Section 564(b)(1) of the Act, 21 U.S.C.section 360bbb-3(b)(1), unless the authorization is terminated  or revoked sooner.       Influenza A by PCR NEGATIVE NEGATIVE Final   Influenza B by PCR NEGATIVE NEGATIVE Final    Comment: (NOTE) The Xpert Xpress SARS-CoV-2/FLU/RSV plus assay is intended as an  aid in the diagnosis of influenza from Nasopharyngeal swab specimens and should not be used as a sole basis for treatment. Nasal washings and aspirates are unacceptable for  Xpert Xpress SARS-CoV-2/FLU/RSV testing.  Fact Sheet for Patients: bloggercourse.com  Fact Sheet for Healthcare Providers: seriousbroker.it  This test is not yet approved or cleared by the [place]  FDA and has been authorized for detection and/or diagnosis of SARS-CoV-2 by FDA under an Emergency Use Authorization (EUA). This EUA will remain in effect (meaning this test can be used) for the duration of the COVID-19 declaration under Section 564(b)(1) of the Act, 21 U.S.C. section 360bbb-3(b)(1), unless the authorization is terminated or revoked.     Resp Syncytial Virus by PCR NEGATIVE NEGATIVE Final    Comment: (NOTE) Fact Sheet for Patients: bloggercourse.com  Fact Sheet for Healthcare Providers: seriousbroker.it  This test is not yet approved or cleared by the [place]  FDA and has been authorized for detection and/or diagnosis of SARS-CoV-2 by FDA under an Emergency Use Authorization (EUA). This EUA will remain in effect (meaning this test can be used) for the duration of the COVID-19 declaration under Section 564(b)(1) of the Act, 21 U.S.C. section 360bbb-3(b)(1), unless the authorization is terminated or revoked.  Performed at [facility], [address] [place], [place] [address]   Respiratory (~20 pathogens) panel by PCR     Status: None   Collection Time: 08/09/24  3:40 PM   Specimen: Nasopharyngeal Swab; Respiratory  Result Value Ref Range Status   Adenovirus NOT DETECTED NOT DETECTED Corrected   Coronavirus 229E NOT DETECTED NOT DETECTED Corrected    Comment: (NOTE) The Coronavirus on the Respiratory Panel, DOES NOT test for the novel  Coronavirus (2019  nCoV) CORRECTED ON 12/25 AT 0401: PREVIOUSLY REPORTED AS NOT DETECTED    Coronavirus HKU1 NOT DETECTED NOT DETECTED Corrected   Coronavirus NL63 NOT DETECTED NOT DETECTED Corrected   Coronavirus OC43 NOT DETECTED NOT DETECTED Corrected   Metapneumovirus NOT DETECTED NOT DETECTED Corrected   Rhinovirus / Enterovirus NOT DETECTED NOT DETECTED Corrected   Influenza A NOT DETECTED NOT DETECTED Corrected   Influenza B NOT DETECTED NOT DETECTED Corrected   Parainfluenza Virus 1 NOT DETECTED NOT DETECTED Corrected   Parainfluenza Virus 2 NOT DETECTED NOT DETECTED Corrected   Parainfluenza Virus 3 NOT DETECTED NOT DETECTED Corrected   Parainfluenza Virus 4 NOT DETECTED NOT DETECTED Corrected   Respiratory Syncytial Virus NOT DETECTED NOT DETECTED Corrected   Bordetella pertussis NOT DETECTED NOT DETECTED Corrected   Bordetella Parapertussis NOT DETECTED NOT DETECTED Corrected   Chlamydophila pneumoniae NOT DETECTED NOT DETECTED Corrected   Mycoplasma pneumoniae NOT DETECTED NOT DETECTED Corrected    Comment: Performed at [facility], [address] [place], [place] [address]  Culture, blood (single)     Status: None (Preliminary result)   Collection Time: 08/09/24 10:02 PM   Specimen: BLOOD RIGHT ARM  Result Value Ref Range Status   Specimen Description   Final    BLOOD RIGHT ARM Performed at [facility], [address] [place], [place] [address]    Special Requests   Final    BOTTLES DRAWN AEROBIC AND ANAEROBIC Blood Culture results may not be optimal due to an inadequate volume of blood received in culture bottles Performed at [facility], [address] [place], [place] [address]    Culture   Final    NO GROWTH 1 DAY Performed at [facility], [address], [place], [place] [address]    Report Status PENDING  Incomplete  MRSA Next Gen by PCR, Nasal     Status: None  Collection Time: 08/10/24  8:47 AM   Specimen: Nasal  Mucosa; Nasal Swab  Result Value Ref Range Status   MRSA by PCR Next Gen NOT DETECTED NOT DETECTED Final    Comment: (NOTE) The GeneXpert MRSA Assay (FDA approved for NASAL specimens only), is one component of a comprehensive MRSA colonization surveillance program. It is not intended to diagnose MRSA infection nor to guide or monitor treatment for MRSA infections. Test performance is not FDA approved in patients less than 36 years old. Performed at [facility], [address], [place], [place] [address]       Radiology Studies: ECHOCARDIOGRAM COMPLETE Result Date: 08/10/2024    ECHOCARDIOGRAM REPORT   Patient Name:   [facility] Date of Exam: 08/10/2024 Medical Rec #:  [id number]                Height:       62.0 in Accession #:    [id number]               Weight:       148.8 lb Date of Birth:  24-Sep-1975                BSA:          1.686 m Patient Age:    48 years                 BP:           134/93 mmHg Patient Gender: F                        HR:           112 bpm. Exam Location:  Inpatient Procedure: 2D Echo, Cardiac Doppler and Color Doppler (Both Spectral and Color            Flow Doppler were utilized during procedure). Indications:     R00.0 Tachycardia; R94.31 Abnormal EKG  History:         Patient has no prior history of Echocardiogram examinations.                  Signs/Symptoms:Bacteremia; Risk Factors:Diabetes, Hypertension,                  Dyslipidemia and Current Smoker. Splenic infarct. Ehler's                  Danlos syndrome.  Sonographer:     Ellouise Mose RDCS Referring Phys:  [id number] CLARETTA HERO AMPONSAH Diagnosing Phys: Toribio Fuel MD IMPRESSIONS  1. Left ventricular ejection fraction, by estimation, is 55 to 60%. Left ventricular ejection fraction by 2D MOD biplane is 53.3 %. The left ventricle has normal function. The left ventricle has no regional wall motion abnormalities. There is moderate left ventricular hypertrophy. Left ventricular  diastolic parameters are consistent with Grade I diastolic dysfunction (impaired relaxation). The average left ventricular global longitudinal strain is -17.2 %. The global longitudinal strain is normal.  2. Right ventricular systolic function is normal. The right ventricular size is normal. Tricuspid regurgitation signal is inadequate for assessing PA pressure.  3. The mitral valve is normal in structure. Trivial mitral valve regurgitation. No evidence of mitral stenosis.  4. The aortic valve is tricuspid. Aortic valve regurgitation is not visualized. No aortic stenosis is present. Comparison(s): No prior Echocardiogram. FINDINGS  Left Ventricle: Left ventricular ejection fraction, by estimation, is 55 to 60%. Left ventricular ejection fraction by 2D MOD  biplane is 53.3 %. The left ventricle has normal function. The left ventricle has no regional wall motion abnormalities. The average left ventricular global longitudinal strain is -17.2 %. Strain was performed and the global longitudinal strain is normal. The left ventricular internal cavity size was normal in size. There is moderate left ventricular hypertrophy. Left ventricular diastolic parameters are consistent with Grade I diastolic dysfunction (impaired relaxation). Right Ventricle: The right ventricular size is normal. No increase in right ventricular wall thickness. Right ventricular systolic function is normal. Tricuspid regurgitation signal is inadequate for assessing PA pressure. Left Atrium: Left atrial size was normal in size. Right Atrium: Right atrial size was normal in size. Pericardium: There is no evidence of pericardial effusion. Mitral Valve: The mitral valve is normal in structure. Trivial mitral valve regurgitation. No evidence of mitral valve stenosis. Tricuspid Valve: The tricuspid valve is normal in structure. Tricuspid valve regurgitation is not demonstrated. No evidence of tricuspid stenosis. Aortic Valve: The aortic valve is tricuspid.  Aortic valve regurgitation is not visualized. No aortic stenosis is present. Pulmonic Valve: The pulmonic valve was normal in structure. Pulmonic valve regurgitation is trivial. No evidence of pulmonic stenosis. Aorta: The aortic root and ascending aorta are structurally normal, with no evidence of dilitation. IAS/Shunts: No atrial level shunt detected by color flow Doppler.  LEFT VENTRICLE PLAX 2D                        Biplane EF (MOD) LVIDd:         4.10 cm         LV Biplane EF:   Left LVIDs:         2.75 cm                          ventricular LV PW:         1.60 cm                          ejection LV IVS:        1.20 cm                          fraction by LVOT diam:     2.30 cm                          2D MOD LV SV:         51                               biplane is LV SV Index:   30                               53.3 %. LVOT Area:     4.15 cm LV IVRT:       106 msec        Diastology                                LV e' medial:    6.20 cm/s  LV E/e' medial:  9.6 LV Volumes (MOD)               LV e' lateral:   7.51 cm/s LV vol d, MOD    91.1 ml       LV E/e' lateral: 7.9 A2C: LV vol d, MOD    82.8 ml       2D Longitudinal A4C:                           Strain LV vol s, MOD    40.5 ml       2D Strain GLS   -17.2 % A2C:                           Avg: LV vol s, MOD    40.4 ml A4C: LV SV MOD A2C:   50.6 ml LV SV MOD A4C:   82.8 ml LV SV MOD BP:    46.9 ml RIGHT VENTRICLE             IVC RV S prime:     10.40 cm/s  IVC diam: 1.20 cm RVOT diam:      3.10 cm TAPSE (M-mode): 2.4 cm      PULMONARY VEINS                             Diastolic Velocity: 48.80 cm/s                             S/D Velocity:       1.40                             Systolic Velocity:  67.70 cm/s LEFT ATRIUM             Index        RIGHT ATRIUM          Index LA diam:        3.30 cm 1.96 cm/m   RA Area:     9.97 cm LA Vol (A2C):   37.1 ml 22.01 ml/m  RA Volume:   17.20 ml 10.20 ml/m LA Vol (A4C):   38.8 ml  23.01 ml/m LA Biplane Vol: 39.7 ml 23.55 ml/m  AORTIC VALVE LVOT Vmax:   90.70 cm/s LVOT Vmean:  57.500 cm/s LVOT VTI:    0.123 m  AORTA Ao Root diam: 3.00 cm Ao Asc diam:  3.00 cm MITRAL VALVE MV Area (PHT): 6.17 cm     SHUNTS MV Decel Time: 123 msec     Systemic VTI:  0.12 m MV E velocity: 59.70 cm/s   Systemic Diam: 2.30 cm MV A velocity: 101.00 cm/s  Pulmonic Diam: 3.10 cm MV E/A ratio:  0.59 Toribio Fuel MD Electronically signed by Toribio Fuel MD Signature Date/Time: 08/10/2024/11:23:54 AM    Final (Updated)    CT ABDOMEN PELVIS W CONTRAST Result Date: 08/09/2024 CLINICAL DATA:  Abdominal pain. EXAM: CT ABDOMEN AND PELVIS WITH CONTRAST TECHNIQUE: Multidetector CT imaging of the abdomen and pelvis was performed using the standard protocol following bolus administration of intravenous contrast. RADIATION DOSE REDUCTION: This exam was performed according to the departmental dose-optimization program which includes automated exposure control, adjustment of the mA and/or kV according to patient size and/or use  of iterative reconstruction technique. CONTRAST:  OMNIPAQUE  IOHEXOL  300 MG/ML  SOLN COMPARISON:  CT abdomen pelvis dated 11/25/2022. FINDINGS: Lower chest: The visualized lung bases are clear. No intra-abdominal free air or free fluid. Hepatobiliary: The liver is unremarkable no biliary dilatation. No calcified gallstone or pericholecystic fluid. Pancreas: Unremarkable. No pancreatic ductal dilatation or surrounding inflammatory changes. Spleen: There is a area of decreased enhancement in the superior pole of the spleen suspicious for splenic infarct. Splenic laceration is not excluded if there is clinical history of trauma. There is mild stranding of the adjacent fat plane which may represent edema or small hemorrhage. No large fluid collection or hematoma. Adrenals/Urinary Tract: The adrenal glands are unremarkable. There is no hydronephrosis on either side. The visualized ureters and  urinary bladder probable Stomach/Bowel: There is no bowel obstruction or active inflammation. The appendix is normal. Vascular/Lymphatic: Mild atherosclerotic calcification of the abdominal aorta. The IVC is unremarkable. No portal venous gas. There is no adenopathy. Reproductive: Hysterectomy. No suspicious adnexal masses. Bilateral ovarian cysts measure up to 2.7 cm on the right. No imaging follow-up. Other: None Musculoskeletal: Moderate bilateral hip arthritic changes. No acute osseous pathology. T9 butterfly vertebra. IMPRESSION: 1. Splenic infarct versus less likely laceration. Correlation with clinical history of trauma recommended. 2. No bowel obstruction. Normal appendix. 3.  Aortic Atherosclerosis (ICD10-I70.0). Electronically Signed   By: Vanetta Chou M.D.   On: 08/09/2024 17:39       LOS: 1 day   Trew Sunde  [facility] Pager on [email or web address]  08/11/2024, 9:44 AM   "

## 2024-08-11 NOTE — Telephone Encounter (Signed)

## 2024-08-11 NOTE — Telephone Encounter (Signed)
 Copied from CRM #[id number]. Topic: Access To Clinicians - Req Clinic Call Back >> Aug 11, 2024  2:59 PM Leim T wrote: Pt wants to leave message for her PCP Pt is being admitted into a hospital in [place] ( [place] )  Pt wants provider to know she has a blood clot in her spling She wants PCP to get records from that hospital so he can be made aware of what's going on  Pt is active on her [facility] mychart/phone

## 2024-08-12 ENCOUNTER — Other Ambulatory Visit (HOSPITAL_COMMUNITY): Payer: Self-pay

## 2024-08-12 DIAGNOSIS — D735 Infarction of spleen: Secondary | ICD-10-CM | POA: Diagnosis not present

## 2024-08-12 LAB — LUPUS ANTICOAGULANT PANEL
DRVVT: 37.2 s (ref 0.0–47.0)
PTT Lupus Anticoagulant: 27.7 s (ref 0.0–43.5)

## 2024-08-12 LAB — GLUCOSE, CAPILLARY
Glucose-Capillary: 58 mg/dL — ABNORMAL LOW (ref 70–99)
Glucose-Capillary: 59 mg/dL — ABNORMAL LOW (ref 70–99)
Glucose-Capillary: 105 mg/dL — ABNORMAL HIGH (ref 70–99)
Glucose-Capillary: 163 mg/dL — ABNORMAL HIGH (ref 70–99)
Glucose-Capillary: 174 mg/dL — ABNORMAL HIGH (ref 70–99)

## 2024-08-12 LAB — BASIC METABOLIC PANEL WITH GFR
Anion gap: 12 (ref 5–15)
BUN: 18 mg/dL (ref 6–20)
CO2: 20 mmol/L — ABNORMAL LOW (ref 22–32)
Calcium: 8.6 mg/dL — ABNORMAL LOW (ref 8.9–10.3)
Chloride: 106 mmol/L (ref 98–111)
Creatinine, Ser: 0.98 mg/dL (ref 0.44–1.00)
GFR, Estimated: >60 mL/min
Glucose, Bld: 183 mg/dL — ABNORMAL HIGH (ref 70–99)
Potassium: 3.8 mmol/L (ref 3.5–5.1)
Sodium: 137 mmol/L (ref 135–145)

## 2024-08-12 LAB — CARDIOLIPIN ANTIBODIES, IGG, IGM, IGA
Anticardiolipin IgA: <9 U/mL (ref 0–11)
Anticardiolipin IgG: <9 GPL U/mL (ref 0–14)
Anticardiolipin IgM: <9 [MPL'U]/mL (ref 0–12)

## 2024-08-12 LAB — PROTEIN C ACTIVITY: Protein C Activity: 139 % (ref 73–180)

## 2024-08-12 LAB — PROTEIN S ACTIVITY: Protein S Activity: 63 % (ref 63–140)

## 2024-08-12 LAB — BETA-2-GLYCOPROTEIN I ABS, IGG/M/A
Beta-2 Glyco I IgG: <9 GPI IgG units (ref 0–20)
Beta-2-Glycoprotein I IgA: <9 GPI IgA units (ref 0–25)
Beta-2-Glycoprotein I IgM: <9 GPI IgM units (ref 0–32)

## 2024-08-12 LAB — PROTEIN S, TOTAL: Protein S Ag, Total: 111 % (ref 60–150)

## 2024-08-12 MED ORDER — OXYCODONE-ACETAMINOPHEN 5-325 MG PO TABS
1.0000 | ORAL_TABLET | Freq: Every evening | ORAL | 0 refills | Status: AC | PRN
  Filled 2024-08-12: qty 30, 30d supply, fill #0

## 2024-08-12 MED ORDER — POLYETHYLENE GLYCOL 3350 17 GM/SCOOP PO POWD
17.0000 g | Freq: Every day | ORAL | 0 refills | Status: AC
  Filled 2024-08-12: qty 238, 14d supply, fill #0

## 2024-08-12 MED ORDER — APIXABAN (ELIQUIS) VTE STARTER PACK (10MG AND 5MG)
ORAL_TABLET | ORAL | 0 refills | Status: AC
  Filled 2024-08-12: qty 74, 28d supply, fill #0

## 2024-08-12 MED ORDER — APIXABAN 5 MG PO TABS
5.0000 mg | ORAL_TABLET | Freq: Two times a day (BID) | ORAL | 0 refills | Status: AC
  Filled 2024-08-12: qty 120, 60d supply, fill #0

## 2024-08-12 NOTE — Plan of Care (Signed)
  Problem: Education: Goal: Ability to describe self-care measures that may prevent or decrease complications (Diabetes Survival Skills Education) will improve Outcome: Progressing   Problem: Coping: Goal: Ability to adjust to condition or change in health will improve Outcome: Progressing   Problem: Skin Integrity: Goal: Risk for impaired skin integrity will decrease Outcome: Progressing   Problem: Activity: Goal: Risk for activity intolerance will decrease Outcome: Progressing

## 2024-08-12 NOTE — Discharge Summary (Signed)
 " [facility]  Physician Discharge Summary   Patient ID: Sara [name] MRN: [id number] DOB/AGE: 48-Oct-1977 48 y.o.  Admit date: 08/09/2024 Discharge date:   08/12/2024   PCP: [name], MD  DISCHARGE DIAGNOSES:  Splenic infarct Intractable nausea vomiting abdominal pain secondary to splenic infarct, improved Seizures Sinus tachycardia Ehlers-Danlos syndrome Diabetes mellitus type 2 no longer on insulin  Essential hypertension   RECOMMENDATIONS FOR OUTPATIENT FOLLOW UP: Dr. Autumn with hematology to arrange outpatient follow-up   Home Health: None Equipment/Devices: None  CODE STATUS: Full code  DISCHARGE CONDITION: fair  Diet recommendation: As before  INITIAL HISTORY: 48 y.o. female with medical history significant for T2DM, HLD, asthma, HTN, CVA, Ehlers-Danlos, seizure disorder, diabetic neuropathy, DDD, chronic back pain, migraine headache, anemia, osteoarthritis and GERD who presented to the ED for evaluation of nausea, vomiting and muscle aches.  Evaluation in the ED revealed possible splenic infarct.  Subsequently she ended up having seizure activity.  She was hospitalized for further management.    HOSPITAL COURSE:   Splenic infarct Etiology unclear.  Echocardiogram shows normal systolic function.  No clot identified.   Hypercoagulable panel was ordered and results are pending.  Case was discussed with Dr. Cathi with hematology who recommended Eliquis  for at least 3 months.  He will schedule outpatient appointment to discuss this further with the patient.  He will follow-up on the hypercoagulable workup.   All of this discussed with patient and she is agreeable to initiate Eliquis .  We will stop her aspirin  in the interim.  She was asked to monitor closely for signs of bleeding.   Intractable nausea and vomiting CT head did not show any bowel obstruction.  Did show splenic infarct etiology of which is unclear. Patient's nausea vomiting  has improved.  Abdomen is benign though she is mildly tender in the left upper quadrant. Symptoms most likely due to splenic infarct.   Concern for occult infection She is noted to be afebrile.  WBC is only mildly elevated.  Lactic acid level was noted to be normal.  No obvious source of infection identified.  Cultures have been negative.  Procalcitonin was less than 0.1.  WBC normal.  MRSA PCR undetectable.  UA did not suggest infection.  Antibiotics were subsequently discontinued.     Seizures with breakthrough activity Patient with longstanding history of seizures.  Did have breakthrough seizures here in the hospital which have resolved.  Continue with her home doses of antiepileptics.     Sinus tachycardia Most likely due to acute issues especially abdominal pain.   Echocardiogram without any acute findings.  Right ventricular function was noted to be normal.  TSH is normal.   Heart rate has improved.   Hypokalemia Supplemented   Ehlers-Danlos syndrome This is chronic issue for her.   Diabetes mellitus type 2 HbA1c was 5.62 months ago.  She is only on Mounjaro.  All her other insulin  products and oral hypoglycemic agents have been discontinued by her PCP.  Outpatient management.   Essential hypertension  Patient is stable.  Okay for discharge home today.  PERTINENT LABS:  The results of significant diagnostics from this hospitalization (including imaging, microbiology, ancillary and laboratory) are listed below for reference.    Microbiology: Recent Results (from the past 240 hours)  Resp panel by RT-PCR (RSV, Flu A&B, Covid) Anterior Nasal Swab     Status: None   Collection Time: 08/09/24  3:40 PM   Specimen: Anterior Nasal Swab  Result Value Ref Range Status  SARS Coronavirus 2 by RT PCR NEGATIVE NEGATIVE Final    Comment: (NOTE) SARS-CoV-2 target nucleic acids are NOT DETECTED.  The SARS-CoV-2 RNA is generally detectable in upper respiratory specimens during the  acute phase of infection. The lowest concentration of SARS-CoV-2 viral copies this assay can detect is 138 copies/mL. A negative result does not preclude SARS-Cov-2 infection and should not be used as the sole basis for treatment or other patient management decisions. A negative result may occur with  improper specimen collection/handling, submission of specimen other than nasopharyngeal swab, presence of viral mutation(s) within the areas targeted by this assay, and inadequate number of viral copies(<138 copies/mL). A negative result must be combined with clinical observations, patient history, and epidemiological information. The expected result is Negative.  Fact Sheet for Patients:  bloggercourse.com  Fact Sheet for Healthcare Providers:  seriousbroker.it  This test is no t yet approved or cleared by the [place]  FDA and  has been authorized for detection and/or diagnosis of SARS-CoV-2 by FDA under an Emergency Use Authorization (EUA). This EUA will remain  in effect (meaning this test can be used) for the duration of the COVID-19 declaration under Section 564(b)(1) of the Act, 21 U.S.C.section 360bbb-3(b)(1), unless the authorization is terminated  or revoked sooner.       Influenza A by PCR NEGATIVE NEGATIVE Final   Influenza B by PCR NEGATIVE NEGATIVE Final    Comment: (NOTE) The Xpert Xpress SARS-CoV-2/FLU/RSV plus assay is intended as an aid in the diagnosis of influenza from Nasopharyngeal swab specimens and should not be used as a sole basis for treatment. Nasal washings and aspirates are unacceptable for Xpert Xpress SARS-CoV-2/FLU/RSV testing.  Fact Sheet for Patients: bloggercourse.com  Fact Sheet for Healthcare Providers: seriousbroker.it  This test is not yet approved or cleared by the [place]  FDA and has been authorized for detection and/or  diagnosis of SARS-CoV-2 by FDA under an Emergency Use Authorization (EUA). This EUA will remain in effect (meaning this test can be used) for the duration of the COVID-19 declaration under Section 564(b)(1) of the Act, 21 U.S.C. section 360bbb-3(b)(1), unless the authorization is terminated or revoked.     Resp Syncytial Virus by PCR NEGATIVE NEGATIVE Final    Comment: (NOTE) Fact Sheet for Patients: bloggercourse.com  Fact Sheet for Healthcare Providers: seriousbroker.it  This test is not yet approved or cleared by the [place]  FDA and has been authorized for detection and/or diagnosis of SARS-CoV-2 by FDA under an Emergency Use Authorization (EUA). This EUA will remain in effect (meaning this test can be used) for the duration of the COVID-19 declaration under Section 564(b)(1) of the Act, 21 U.S.C. section 360bbb-3(b)(1), unless the authorization is terminated or revoked.  Performed at [facility], [address] [place], [place] [address]   Respiratory (~20 pathogens) panel by PCR     Status: None   Collection Time: 08/09/24  3:40 PM   Specimen: Nasopharyngeal Swab; Respiratory  Result Value Ref Range Status   Adenovirus NOT DETECTED NOT DETECTED Corrected   Coronavirus 229E NOT DETECTED NOT DETECTED Corrected    Comment: (NOTE) The Coronavirus on the Respiratory Panel, DOES NOT test for the novel  Coronavirus (2019 nCoV) CORRECTED ON 12/25 AT 0401: PREVIOUSLY REPORTED AS NOT DETECTED    Coronavirus HKU1 NOT DETECTED NOT DETECTED Corrected   Coronavirus NL63 NOT DETECTED NOT DETECTED Corrected   Coronavirus OC43 NOT DETECTED NOT DETECTED Corrected   Metapneumovirus NOT DETECTED NOT DETECTED Corrected  Rhinovirus / Enterovirus NOT DETECTED NOT DETECTED Corrected   Influenza A NOT DETECTED NOT DETECTED Corrected   Influenza B NOT DETECTED NOT DETECTED Corrected   Parainfluenza Virus 1 NOT  DETECTED NOT DETECTED Corrected   Parainfluenza Virus 2 NOT DETECTED NOT DETECTED Corrected   Parainfluenza Virus 3 NOT DETECTED NOT DETECTED Corrected   Parainfluenza Virus 4 NOT DETECTED NOT DETECTED Corrected   Respiratory Syncytial Virus NOT DETECTED NOT DETECTED Corrected   Bordetella pertussis NOT DETECTED NOT DETECTED Corrected   Bordetella Parapertussis NOT DETECTED NOT DETECTED Corrected   Chlamydophila pneumoniae NOT DETECTED NOT DETECTED Corrected   Mycoplasma pneumoniae NOT DETECTED NOT DETECTED Corrected    Comment: Performed at [facility], [address], [place], [place] [address]  Culture, blood (single)     Status: None (Preliminary result)   Collection Time: 08/09/24 10:02 PM   Specimen: BLOOD RIGHT ARM  Result Value Ref Range Status   Specimen Description   Final    BLOOD RIGHT ARM Performed at [facility], [address] [place], [place] [address]    Special Requests   Final    BOTTLES DRAWN AEROBIC AND ANAEROBIC Blood Culture results may not be optimal due to an inadequate volume of blood received in culture bottles Performed at [facility], [address] [place], [place] [address]    Culture   Final    NO GROWTH 2 DAYS Performed at [facility], [address], [place], [place] [address]    Report Status PENDING  Incomplete  MRSA Next Gen by PCR, Nasal     Status: None   Collection Time: 08/10/24  8:47 AM   Specimen: Nasal Mucosa; Nasal Swab  Result Value Ref Range Status   MRSA by PCR Next Gen NOT DETECTED NOT DETECTED Final    Comment: (NOTE) The GeneXpert MRSA Assay (FDA approved for NASAL specimens only), is one component of a comprehensive MRSA colonization surveillance program. It is not intended to diagnose MRSA infection nor to guide or monitor treatment for MRSA infections. Test performance is not FDA approved in patients less than 35 years old. Performed at [facility],  [address] [place], [place] [address]      Labs:   Basic Metabolic Panel: Recent Labs  Lab 08/09/24 1540 08/09/24 2046 08/10/24 0102 08/11/24 0739 08/12/24 0653  NA 136 136 135 138 137  K 3.3* 3.4* 3.4* 3.4* 3.8  CL 100 100 102 106 106  CO2 20* 23 23 20* 20*  GLUCOSE 246* 132* 186* 70 183*  BUN 18 14 12 14 18   CREATININE 1.00 0.73 0.69 0.83 0.98  CALCIUM  10.0 9.6 9.3 9.2 8.6*  MG  --  2.0  --  2.1  --   PHOS  --  3.0  --   --   --    Liver Function Tests: Recent Labs  Lab 08/09/24 1540  AST 18  ALT 16  ALKPHOS 113  BILITOT 0.5  PROT 8.5*  ALBUMIN 4.5   Recent Labs  Lab 08/09/24 1540  LIPASE 34   CBC: Recent Labs  Lab 08/09/24 1540 08/10/24 0102 08/11/24 0739  WBC 12.6* 11.9* 8.9  HGB 14.0 12.6 12.1  HCT 44.2 38.8 38.3  MCV 77.4* 75.9* 79.0*  PLT 219 233 163     CBG: Recent Labs  Lab 08/12/24 0157 08/12/24 0222 08/12/24 0255 08/12/24 0407 08/12/24 0742  GLUCAP 59* 58* 105* 163* 174*  IMAGING STUDIES ECHOCARDIOGRAM COMPLETE Result Date: 08/10/2024    ECHOCARDIOGRAM REPORT   Patient Name:   Sara [name] Date of Exam: 08/10/2024 Medical Rec #:  [id number]                Height:       62.0 in Accession #:    [id number]               Weight:       148.8 lb Date of Birth:  March 09, 1976                BSA:          1.686 m Patient Age:    48 years                 BP:           134/93 mmHg Patient Gender: F                        HR:           112 bpm. Exam Location:  Inpatient Procedure: 2D Echo, Cardiac Doppler and Color Doppler (Both Spectral and Color            Flow Doppler were utilized during procedure). Indications:     R00.0 Tachycardia; R94.31 Abnormal EKG  History:         Patient has no prior history of Echocardiogram examinations.                  Signs/Symptoms:Bacteremia; Risk Factors:Diabetes, Hypertension,                  Dyslipidemia and Current Smoker. Splenic infarct. Ehler's                  Danlos syndrome.   Sonographer:     Ellouise Mose RDCS Referring Phys:  [id number] CLARETTA HERO AMPONSAH Diagnosing Phys: Toribio Fuel MD IMPRESSIONS  1. Left ventricular ejection fraction, by estimation, is 55 to 60%. Left ventricular ejection fraction by 2D MOD biplane is 53.3 %. The left ventricle has normal function. The left ventricle has no regional wall motion abnormalities. There is moderate left ventricular hypertrophy. Left ventricular diastolic parameters are consistent with Grade I diastolic dysfunction (impaired relaxation). The average left ventricular global longitudinal strain is -17.2 %. The global longitudinal strain is normal.  2. Right ventricular systolic function is normal. The right ventricular size is normal. Tricuspid regurgitation signal is inadequate for assessing PA pressure.  3. The mitral valve is normal in structure. Trivial mitral valve regurgitation. No evidence of mitral stenosis.  4. The aortic valve is tricuspid. Aortic valve regurgitation is not visualized. No aortic stenosis is present. Comparison(s): No prior Echocardiogram. FINDINGS  Left Ventricle: Left ventricular ejection fraction, by estimation, is 55 to 60%. Left ventricular ejection fraction by 2D MOD biplane is 53.3 %. The left ventricle has normal function. The left ventricle has no regional wall motion abnormalities. The average left ventricular global longitudinal strain is -17.2 %. Strain was performed and the global longitudinal strain is normal. The left ventricular internal cavity size was normal in size. There is moderate left ventricular hypertrophy. Left ventricular diastolic parameters are consistent with Grade I diastolic dysfunction (impaired relaxation). Right Ventricle: The right ventricular size is normal. No increase in right ventricular wall thickness. Right ventricular systolic function is normal. Tricuspid regurgitation signal is inadequate for assessing PA pressure. Left Atrium: Left atrial size was normal in  size. Right  Atrium: Right atrial size was normal in size. Pericardium: There is no evidence of pericardial effusion. Mitral Valve: The mitral valve is normal in structure. Trivial mitral valve regurgitation. No evidence of mitral valve stenosis. Tricuspid Valve: The tricuspid valve is normal in structure. Tricuspid valve regurgitation is not demonstrated. No evidence of tricuspid stenosis. Aortic Valve: The aortic valve is tricuspid. Aortic valve regurgitation is not visualized. No aortic stenosis is present. Pulmonic Valve: The pulmonic valve was normal in structure. Pulmonic valve regurgitation is trivial. No evidence of pulmonic stenosis. Aorta: The aortic root and ascending aorta are structurally normal, with no evidence of dilitation. IAS/Shunts: No atrial level shunt detected by color flow Doppler.  LEFT VENTRICLE PLAX 2D                        Biplane EF (MOD) LVIDd:         4.10 cm         LV Biplane EF:   Left LVIDs:         2.75 cm                          ventricular LV PW:         1.60 cm                          ejection LV IVS:        1.20 cm                          fraction by LVOT diam:     2.30 cm                          2D MOD LV SV:         51                               biplane is LV SV Index:   30                               53.3 %. LVOT Area:     4.15 cm LV IVRT:       106 msec        Diastology                                LV e' medial:    6.20 cm/s                                LV E/e' medial:  9.6 LV Volumes (MOD)               LV e' lateral:   7.51 cm/s LV vol d, MOD    91.1 ml       LV E/e' lateral: 7.9 A2C: LV vol d, MOD    82.8 ml       2D Longitudinal A4C:                           Strain LV vol s, MOD  40.5 ml       2D Strain GLS   -17.2 % A2C:                           Avg: LV vol s, MOD    40.4 ml A4C: LV SV MOD A2C:   50.6 ml LV SV MOD A4C:   82.8 ml LV SV MOD BP:    46.9 ml RIGHT VENTRICLE             IVC RV S prime:     10.40 cm/s  IVC diam: 1.20 cm RVOT diam:      3.10 cm TAPSE  (M-mode): 2.4 cm      PULMONARY VEINS                             Diastolic Velocity: 48.80 cm/s                             S/D Velocity:       1.40                             Systolic Velocity:  67.70 cm/s LEFT ATRIUM             Index        RIGHT ATRIUM          Index LA diam:        3.30 cm 1.96 cm/m   RA Area:     9.97 cm LA Vol (A2C):   37.1 ml 22.01 ml/m  RA Volume:   17.20 ml 10.20 ml/m LA Vol (A4C):   38.8 ml 23.01 ml/m LA Biplane Vol: 39.7 ml 23.55 ml/m  AORTIC VALVE LVOT Vmax:   90.70 cm/s LVOT Vmean:  57.500 cm/s LVOT VTI:    0.123 m  AORTA Ao Root diam: 3.00 cm Ao Asc diam:  3.00 cm MITRAL VALVE MV Area (PHT): 6.17 cm     SHUNTS MV Decel Time: 123 msec     Systemic VTI:  0.12 m MV E velocity: 59.70 cm/s   Systemic Diam: 2.30 cm MV A velocity: 101.00 cm/s  Pulmonic Diam: 3.10 cm MV E/A ratio:  0.59 Toribio Fuel MD Electronically signed by Toribio Fuel MD Signature Date/Time: 08/10/2024/11:23:54 AM    Final (Updated)    CT ABDOMEN PELVIS W CONTRAST Result Date: 08/09/2024 CLINICAL DATA:  Abdominal pain. EXAM: CT ABDOMEN AND PELVIS WITH CONTRAST TECHNIQUE: Multidetector CT imaging of the abdomen and pelvis was performed using the standard protocol following bolus administration of intravenous contrast. RADIATION DOSE REDUCTION: This exam was performed according to the departmental dose-optimization program which includes automated exposure control, adjustment of the mA and/or kV according to patient size and/or use of iterative reconstruction technique. CONTRAST:  OMNIPAQUE  IOHEXOL  300 MG/ML  SOLN COMPARISON:  CT abdomen pelvis dated 11/25/2022. FINDINGS: Lower chest: The visualized lung bases are clear. No intra-abdominal free air or free fluid. Hepatobiliary: The liver is unremarkable no biliary dilatation. No calcified gallstone or pericholecystic fluid. Pancreas: Unremarkable. No pancreatic ductal dilatation or surrounding inflammatory changes. Spleen: There is a area of  decreased enhancement in the superior pole of the spleen suspicious for splenic infarct. Splenic laceration is not excluded if there is clinical history of trauma. There is mild stranding of the adjacent fat plane  which may represent edema or small hemorrhage. No large fluid collection or hematoma. Adrenals/Urinary Tract: The adrenal glands are unremarkable. There is no hydronephrosis on either side. The visualized ureters and urinary bladder probable Stomach/Bowel: There is no bowel obstruction or active inflammation. The appendix is normal. Vascular/Lymphatic: Mild atherosclerotic calcification of the abdominal aorta. The IVC is unremarkable. No portal venous gas. There is no adenopathy. Reproductive: Hysterectomy. No suspicious adnexal masses. Bilateral ovarian cysts measure up to 2.7 cm on the right. No imaging follow-up. Other: None Musculoskeletal: Moderate bilateral hip arthritic changes. No acute osseous pathology. T9 butterfly vertebra. IMPRESSION: 1. Splenic infarct versus less likely laceration. Correlation with clinical history of trauma recommended. 2. No bowel obstruction. Normal appendix. 3.  Aortic Atherosclerosis (ICD10-I70.0). Electronically Signed   By: Vanetta Chou M.D.   On: 08/09/2024 17:39    DISCHARGE EXAMINATION: Vitals:   08/11/24 1957 08/11/24 2352 08/12/24 0404 08/12/24 0823  BP: 103/72 98/62 101/67 130/83  Pulse: (!) 101 100 93 (!) 101  Resp: 18 18 18    Temp: 98.6 F (37 C) 98.6 F (37 C) 97.9 F (36.6 C)   TempSrc:      SpO2: 99% 100% 99%   Weight:      Height:       General appearance: Awake alert.  In no distress Resp: Clear to auscultation bilaterally.  Normal effort Cardio: S1-S2 is normal regular.  No S3-S4.  No rubs murmurs or bruit GI: Abdomen is soft.  Nontender nondistended.  Bowel sounds are present normal.  No masses organomegaly   DISPOSITION: Home  Discharge Instructions     Call MD for:  difficulty breathing, headache or visual disturbances    Complete by: As directed    Call MD for:  extreme fatigue   Complete by: As directed    Call MD for:  persistant dizziness or light-headedness   Complete by: As directed    Call MD for:  persistant nausea and vomiting   Complete by: As directed    Call MD for:  severe uncontrolled pain   Complete by: As directed    Call MD for:  temperature >100.4   Complete by: As directed    Diet - low sodium heart healthy   Complete by: As directed    Discharge instructions   Complete by: As directed    The blood doctors office will call you for follow-up appointment.  Please be sure to follow-up with your primary care provider within 1 week after discharge.  Take your medications as prescribed.  Monitor for signs of bleeding as we have discussed.  Seek attention if your symptoms worsen.  You were cared for by a hospitalist during your hospital stay. If you have any questions about your discharge medications or the care you received while you were in the hospital after you are discharged, you can call the unit and asked to speak with the hospitalist on call if the hospitalist that took care of you is not available. Once you are discharged, your primary care physician will handle any further medical issues. Please note that NO REFILLS for any discharge medications will be authorized once you are discharged, as it is imperative that you return to your primary care physician (or establish a relationship with a primary care physician if you do not have one) for your aftercare needs so that they can reassess your need for medications and monitor your lab values. If you do not have a primary care physician, you can call  [phone] for a physician referral.   Increase activity slowly   Complete by: As directed          Allergies as of 08/12/2024       Reactions   Other Hives, Itching, Rash   Coban, patient states that skin turn red and has serve itching  Coban, patient states that skin turn red and has  serve itching  Coban, patient states that skin turn red and has serve itching  Coban, patient states that skin turn red and has serve itching    Coconut (cocos Nucifera) Itching, Swelling   Coconut,  Facial swelling   Coconut Fatty Acid Itching   Latex Itching, Rash   (Elastic in underwear)   Tape Rash        Medication List     STOP taking these medications    aspirin  81 MG chewable tablet   betamethasone  dipropionate 0.05 % cream   bisacodyl  10 MG suppository Commonly known as: DULCOLAX   cetirizine 10 MG tablet Commonly known as: ZYRTEC   famotidine  40 MG tablet Commonly known as: PEPCID    hydrochlorothiazide 25 MG tablet Commonly known as: HYDRODIURIL   insulin  aspart 100 UNIT/ML injection Commonly known as: novoLOG    insulin  glargine 100 UNIT/ML injection Commonly known as: LANTUS    insulin  lispro 100 UNIT/ML injection Commonly known as: HUMALOG   lamoTRIgine  100 MG tablet Commonly known as: LAMICTAL    loperamide 2 MG capsule Commonly known as: IMODIUM   Maalox Max 1000-60 MG Chew Generic drug: Calcium  Carbonate-Simethicone   meloxicam 15 MG tablet Commonly known as: MOBIC   nicotine 10 MG inhaler Commonly known as: NICOTROL   olmesartan 40 MG tablet Commonly known as: BENICAR   omeprazole 20 MG capsule Commonly known as: PRILOSEC   Ozempic (1 MG/DOSE) 4 MG/3ML Sopn Generic drug: Semaglutide (1 MG/DOSE)   polyethylene glycol 17 g packet Commonly known as: MIRALAX  / GLYCOLAX  Replaced by: polyethylene glycol powder 17 GM/SCOOP powder   pregabalin 100 MG capsule Commonly known as: LYRICA   prochlorperazine  5 MG tablet Commonly known as: COMPAZINE    tacrolimus 0.1 % ointment Commonly known as: PROTOPIC       TAKE these medications    acetaminophen  500 MG tablet Commonly known as: TYLENOL  Take 500 mg by mouth every 6 (six) hours as needed for moderate pain.   albuterol  108 (90 Base) MCG/ACT inhaler Commonly known as: VENTOLIN   HFA Inhale 2 to 3 puffs into the lungs every 6 hours as needed (respiratory problems)   amLODipine  10 MG tablet Commonly known as: NORVASC  Take 10 mg by mouth daily.   Apixaban  Starter Pack (10mg  and 5mg ) Commonly known as: ELIQUIS  STARTER PACK Take as directed on package: start with two-5mg  tablets twice daily for 7 days. On day 8, switch to one-5mg  tablet twice daily.   apixaban  5 MG Tabs tablet Commonly known as: ELIQUIS  Take 1 tablet (5 mg total) by mouth 2 (two) times daily. Please start this only after you have completed all the tablets in the starter pack. Start taking on: September 11, 2024   atorvastatin  80 MG tablet Commonly known as: LIPITOR  Take 80 mg by mouth daily.   blood glucose meter kit and supplies by Other route once for 1 dose. Use as instructed   Botox 100 units Solr injection Generic drug: botulinum toxin Type A INJECT 100 UNITS IN THE MUSCLE EVERY 90 DAYS. TO BE ADMINISTERED BY A HEALTHCARE PROFESSIONAL.   Combigan  0.2-0.5 % ophthalmic solution Generic drug: brimonidine -timolol  Place  1 drop into the right eye 2 (two) times daily.   cyanocobalamin 250 MCG tablet Commonly known as: VITAMIN B12 Take 250 mcg by mouth daily.   cyclobenzaprine  10 MG tablet Commonly known as: FLEXERIL  Take 1 tablet (10 mg total) by mouth 3 (three) times daily as needed for muscle spasms. What changed: when to take this   Dexcom G7 Receiver Cosmos as directed.   Dexcom G7 Sensor Misc 1 Device See admin instructions. APPLY DEXCOM G& SENSOR EVERY 10 DAYS   DULoxetine 30 MG capsule Commonly known as: CYMBALTA Take 90 mg by mouth daily.   ferrous sulfate 325 (65 FE) MG tablet Take 325 mg by mouth 3 (three) times a week.   gabapentin  300 MG capsule Commonly known as: NEURONTIN  Take 600-900 mg by mouth See admin instructions. Take 2 capsule (600MG ) by mouth in the Morning and At [facility]. Take 3 capsules (900MG ) at Night.   hydrOXYzine  50 MG tablet Commonly known as:  ATARAX  Take 50 mg by mouth every 8 (eight) hours as needed for anxiety.   levETIRAcetam  750 MG tablet Commonly known as: KEPPRA  Take 1,500 mg by mouth 2 (two) times daily.   mometasone 50 MCG/ACT nasal spray Commonly known as: NASONEX SMARTSIG:2 Spray(s) Both Nares Daily   Mounjaro 15 MG/0.5ML Pen Generic drug: tirzepatide SMARTSIG:15 Milligram(s) SUB-Q Once a Week   ondansetron  4 MG tablet Commonly known as: ZOFRAN  Take 1 tablet (4 mg total) by mouth daily as needed for nausea.   oxyCODONE -acetaminophen  5-325 MG tablet Commonly known as: PERCOCET/ROXICET Take 1 tablet by mouth at bedtime as needed for severe pain (pain score 7-10). What changed: Another medication with the same name was removed. Continue taking this medication, and follow the directions you see here.   pantoprazole  40 MG tablet Commonly known as: PROTONIX  Take 40 mg by mouth daily.   polyethylene glycol powder 17 GM/SCOOP powder Commonly known as: GLYCOLAX /MIRALAX  Take 17 g by mouth daily. Dissolve 1 capful (17g) in 4-8 ounces of liquid and take by mouth daily. Replaces: polyethylene glycol 17 g packet   prazosin 5 MG capsule Commonly known as: MINIPRESS Take 5 mg by mouth at bedtime.   Senexon-S 8.6-50 MG tablet Generic drug: senna-docusate Take 2 tablets by mouth daily.   topiramate  100 MG tablet Commonly known as: TOPAMAX  Take 1 tablet (100 mg total) by mouth 4 (four) times daily. What changed: when to take this   vitamin B-1 250 MG tablet Take 250 mg by mouth daily.          Follow-up Information     Pasam, Avinash, MD Follow up.   Specialty: Oncology Why: His office will call to schedule appointment. Contact information: [address] [place] [address] [phone]         [name], MD. Schedule an appointment as soon as possible for a visit in 1 week(s).   Specialty: Family Medicine Why: post hospitalization follow up Contact information: [address]  [address] [facility] Medicine [place] [address] [phone]                 TOTAL DISCHARGE TIME: 35 minutes  Sara [name]  [facility] Pager on [email or web address]  08/12/2024, 11:13 AM    "

## 2024-08-12 NOTE — Progress Notes (Signed)
 Patient called out saying her dexcom alerted her that her blood sugar was 59. Patients blood sugar was checked with hospital glucometer and it also read 59. Patient was alert and oriented, asymptomatic. Apple juice was given and A. Andrez, NP was notified. Patients re-check blood sugar was 58 and patient was still asymptomatic and was requesting a snack, another apple juice and a snack was given and A. Andrez, NP was notified. After the snack and apple juice patients blood sugar was 105. Will continue to monitor.

## 2024-08-12 NOTE — Evaluation (Signed)
 Physical Therapy Evaluation Patient Details Name: Sara [name] MRN: [id number] DOB: 1975-11-09 Today's Date: 08/12/2024  History of Present Illness  Pt admitted from home with intractable N/v; splenic infarct and sepsis.  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 and DM with neuropathy  Clinical Impression  Pt admitted as above and presenting with functional mobility limitations 2* generalized weakness and ambulatory balance deficits.  This am, pt up to ambulate 280' with RW and CGA for safety.  Pt reports her dtrs are assisting her and she will not be alone for the next two weeks.  Pt also reports that she has been working with her PT Deward on balance and has her balance exercises for home - pt would like to follow up with same and defer HHPT at this time.  Pt eager for dc home this date.        If plan is discharge home, recommend the following: A little help with walking and/or transfers;Assistance with cooking/housework;Assist for transportation;Help with stairs or ramp for entrance   Can travel by private vehicle        Equipment Recommendations None recommended by PT  Recommendations for Other Services       Functional Status Assessment Patient has had a recent decline in their functional status and demonstrates the ability to make significant improvements in function in a reasonable and predictable amount of time.     Precautions / Restrictions Precautions Precautions: Fall Restrictions Weight Bearing Restrictions Per Provider Order: No      Mobility  Bed Mobility Overal bed mobility: Modified Independent             General bed mobility comments: use of bed rail    Transfers Overall transfer level: Needs assistance Equipment used: None Transfers: Sit to/from Stand Sit to Stand: Contact guard assist           General transfer comment: min steady assist only     Ambulation/Gait Ambulation/Gait assistance: Min assist, Contact guard assist Gait Distance (Feet): 280 Feet Assistive device: Rolling walker (2 wheels), None Gait Pattern/deviations: Step-to pattern, Step-through pattern, Decreased step length - right, Decreased step length - left, Shuffle, Wide base of support Gait velocity: decr     General Gait Details: 10' sans AD but with noted instability and risk of falling; marked improvement with use of RW - CGA for safety and min cues for posture and position from [employer]            Wheelchair Mobility     Tilt Bed    Modified Rankin (Stroke Patients Only)       Balance Overall balance assessment: Needs assistance Sitting-balance support: No upper extremity supported, Feet supported Sitting balance-Leahy Scale: Good     Standing balance support: No upper extremity supported Standing balance-Leahy Scale: Fair                               Pertinent Vitals/Pain Pain Assessment Pain Assessment: No/denies pain    Home Living Family/patient expects to be discharged to:: Private residence Living Arrangements: Spouse/significant other Available Help at Discharge: Family;Available 24 hours/day (Pt reports daughters and spouse to assist and that she is not to be left alone for 2 weeks) Type of Home: Mobile home Home Access: Stairs to enter Entrance Stairs-Rails: Left Entrance Stairs-Number of Steps: 5   Home Layout: One level Home Equipment:  Rolling Walker (2 wheels);Crutches;Cane - single point      Prior Function Prior Level of Function : Independent/Modified Independent             Mobility Comments: returned to IND from prior CVA       Extremity/Trunk Assessment   Upper Extremity Assessment Upper Extremity Assessment: Generalized weakness    Lower Extremity Assessment Lower Extremity Assessment: Generalized weakness    Cervical / Trunk Assessment Cervical / Trunk Assessment: Normal   Communication   Communication Communication: No apparent difficulties    Cognition Arousal: Alert Behavior During Therapy: WFL for tasks assessed/performed                             Following commands: Intact       Cueing Cueing Techniques: Verbal cues     General Comments      Exercises     Assessment/Plan    PT Assessment Patient needs continued PT services  PT Problem List Decreased strength;Decreased activity tolerance;Decreased balance;Decreased mobility;Decreased knowledge of use of DME       PT Treatment Interventions DME instruction;Gait training;Stair training;Functional mobility training;Therapeutic activities;Therapeutic exercise;Balance training;Patient/family education    PT Goals (Current goals can be found in the Care Plan section)  Acute Rehab PT Goals Patient Stated Goal: Regain IND PT Goal Formulation: With patient Time For Goal Achievement: 08/12/24 Potential to Achieve Goals: Good    Frequency Min 3X/week     Co-evaluation               AM-PAC PT 6 Clicks Mobility  Outcome Measure Help needed turning from your back to your side while in a flat bed without using bedrails?: None Help needed moving from lying on your back to sitting on the side of a flat bed without using bedrails?: None Help needed moving to and from a bed to a chair (including a wheelchair)?: A Little Help needed standing up from a chair using your arms (e.g., wheelchair or bedside chair)?: A Little Help needed to walk in hospital room?: A Little Help needed climbing 3-5 steps with a railing? : A Little 6 Click Score: 20    End of Session Equipment Utilized During Treatment: Gait belt Activity Tolerance: Patient tolerated treatment well Patient left: in chair;with call bell/phone within reach;with chair alarm set Nurse Communication: Mobility status PT Visit Diagnosis: Unsteadiness on feet (R26.81);Muscle weakness (generalized) (M62.81)    Time:  8944-8882 PT Time Calculation (min) (ACUTE ONLY): 22 min   Charges:   PT Evaluation $PT Eval Low Complexity: 1 Low   PT General Charges $$ ACUTE PT VISIT: 1 Visit         [facility] PT Acute Rehabilitation Services Office [phone]   Heliodoro Domagalski 08/12/2024, 3:46 PM

## 2024-08-12 NOTE — Discharge Instructions (Signed)
 Information on my medicine - ELIQUIS  (apixaban )  Why was Eliquis  prescribed for you? Eliquis  was prescribed to treat blood clots that may have been found in the veins of your legs (deep vein thrombosis) or in your lungs (pulmonary embolism) and to reduce the risk of them occurring again.  What do You need to know about Eliquis  ? The starting dose is 10 mg (two 5 mg tablets) taken TWICE daily for the FIRST SEVEN (7) DAYS, then on 08/18/2024 the dose is reduced to ONE 5 mg tablet taken TWICE daily.  Eliquis  may be taken with or without food.   Try to take the dose about the same time in the morning and in the evening. If you have difficulty swallowing the tablet whole please discuss with your pharmacist how to take the medication safely.  Take Eliquis  exactly as prescribed and DO NOT stop taking Eliquis  without talking to the doctor who prescribed the medication.  Stopping may increase your risk of developing a new blood clot.  Refill your prescription before you run out.  After discharge, you should have regular check-up appointments with your healthcare provider that is prescribing your Eliquis .    What do you do if you miss a dose? If a dose of ELIQUIS  is not taken at the scheduled time, take it as soon as possible on the same day and twice-daily administration should be resumed. The dose should not be doubled to make up for a missed dose.  Important Safety Information A possible side effect of Eliquis  is bleeding. You should call your healthcare provider right away if you experience any of the following: Bleeding from an injury or your nose that does not stop. Unusual colored urine (red or dark brown) or unusual colored stools (red or black). Unusual bruising for unknown reasons. A serious fall or if you hit your head (even if there is no bleeding).  Some medicines may interact with Eliquis  and might increase your risk of bleeding or clotting while on Eliquis . To help avoid this,  consult your healthcare provider or pharmacist prior to using any new prescription or non-prescription medications, including herbals, vitamins, non-steroidal anti-inflammatory drugs (NSAIDs) and supplements.  This website has more information on Eliquis  (apixaban ): [email or web address] .com/eliquis dena

## 2024-08-13 LAB — GLUCOSE, CAPILLARY: Glucose-Capillary: 55 mg/dL — ABNORMAL LOW (ref 70–99)

## 2024-08-14 LAB — HOMOCYSTEINE: Homocysteine: 20.2 umol/L — ABNORMAL HIGH (ref 0.0–14.5)

## 2024-08-15 ENCOUNTER — Other Ambulatory Visit (HOSPITAL_COMMUNITY): Payer: Self-pay

## 2024-08-15 LAB — FACTOR 5 LEIDEN

## 2024-08-15 LAB — CULTURE, BLOOD (SINGLE): Culture: NO GROWTH

## 2024-08-16 LAB — PROTHROMBIN GENE MUTATION

## 2024-08-17 LAB — PROTEIN C, TOTAL: Protein C, Total: 134 % (ref 60–150)

## 2024-08-21 ENCOUNTER — Other Ambulatory Visit (HOSPITAL_COMMUNITY): Payer: Self-pay

## 2024-09-11 ENCOUNTER — Inpatient Hospital Stay: Admitting: Oncology

## 2024-09-11 ENCOUNTER — Inpatient Hospital Stay

## 2024-09-17 ENCOUNTER — Telehealth: Payer: Self-pay

## 2024-09-17 NOTE — Telephone Encounter (Signed)
 Spoke directly with patient advising of new appointments date and time, she was agreeable and verbalized understanding.

## 2024-09-18 ENCOUNTER — Inpatient Hospital Stay: Admitting: Oncology

## 2024-09-18 ENCOUNTER — Inpatient Hospital Stay

## 2024-09-18 NOTE — Progress Notes (Signed)
 Pt did not appear for scheduled video visit. At 3:20pm, provider called pt to check in. Pt reported that she forgot about her scheduled appointment, requested that she and provider reschedule for later in the week. Pt and provider agreed to meet on Thursday, 09/21/2024 at 1:00pm via video.   Jeoffrey Shutter, MA Clinical Psychology Intern  Supervised by: Rock Standard, PhD Clinical Psychologist

## 2024-09-19 ENCOUNTER — Other Ambulatory Visit: Payer: Self-pay | Admitting: Oncology

## 2024-09-19 DIAGNOSIS — D735 Infarction of spleen: Secondary | ICD-10-CM

## 2024-09-22 ENCOUNTER — Inpatient Hospital Stay

## 2024-09-22 DIAGNOSIS — D735 Infarction of spleen: Secondary | ICD-10-CM

## 2024-09-22 LAB — CBC WITH DIFFERENTIAL (CANCER CENTER ONLY)
Abs Immature Granulocytes: 0.02 10*3/uL (ref 0.00–0.07)
Basophils Absolute: 0 10*3/uL (ref 0.0–0.1)
Basophils Relative: 0 %
Eosinophils Absolute: 0.1 10*3/uL (ref 0.0–0.5)
Eosinophils Relative: 2 %
HCT: 37.3 % (ref 36.0–46.0)
Hemoglobin: 11.8 g/dL — ABNORMAL LOW (ref 12.0–15.0)
Immature Granulocytes: 0 %
Lymphocytes Relative: 22 %
Lymphs Abs: 1.5 10*3/uL (ref 0.7–4.0)
MCH: 25.1 pg — ABNORMAL LOW (ref 26.0–34.0)
MCHC: 31.6 g/dL (ref 30.0–36.0)
MCV: 79.2 fL — ABNORMAL LOW (ref 80.0–100.0)
Monocytes Absolute: 0.4 10*3/uL (ref 0.1–1.0)
Monocytes Relative: 6 %
Neutro Abs: 4.6 10*3/uL (ref 1.7–7.7)
Neutrophils Relative %: 70 %
Platelet Count: 212 10*3/uL (ref 150–400)
RBC: 4.71 MIL/uL (ref 3.87–5.11)
RDW: 14.2 % (ref 11.5–15.5)
WBC Count: 6.7 10*3/uL (ref 4.0–10.5)
nRBC: 0 % (ref 0.0–0.2)

## 2024-09-22 LAB — CMP (CANCER CENTER ONLY)
ALT: 12 U/L (ref 0–44)
AST: 14 U/L — ABNORMAL LOW (ref 15–41)
Albumin: 4.1 g/dL (ref 3.5–5.0)
Alkaline Phosphatase: 94 U/L (ref 38–126)
Anion gap: 11 (ref 5–15)
BUN: 16 mg/dL (ref 6–20)
CO2: 22 mmol/L (ref 22–32)
Calcium: 9.7 mg/dL (ref 8.9–10.3)
Chloride: 108 mmol/L (ref 98–111)
Creatinine: 0.98 mg/dL (ref 0.44–1.00)
GFR, Estimated: >60 mL/min
Glucose, Bld: 177 mg/dL — ABNORMAL HIGH (ref 70–99)
Potassium: 4.2 mmol/L (ref 3.5–5.1)
Sodium: 141 mmol/L (ref 135–145)
Total Bilirubin: 0.3 mg/dL (ref 0.0–1.2)
Total Protein: 7.3 g/dL (ref 6.5–8.1)

## 2024-09-22 LAB — D-DIMER, QUANTITATIVE: D-Dimer, Quant: 0.4 ug{FEU}/mL (ref 0.00–0.50)

## 2024-09-22 LAB — LACTATE DEHYDROGENASE: LDH: 136 U/L (ref 105–235)

## 2024-09-23 ENCOUNTER — Inpatient Hospital Stay: Admitting: Oncology

## 2025-09-10 ENCOUNTER — Inpatient Hospital Stay

## 2025-09-10 ENCOUNTER — Inpatient Hospital Stay: Admitting: Oncology
